# Patient Record
Sex: Female | Born: 1960
Health system: Southern US, Community
[De-identification: ages and names within clinical notes are randomized; demographics above are authoritative.]

## PROBLEM LIST (undated history)

## (undated) DIAGNOSIS — J40 Bronchitis, not specified as acute or chronic: Secondary | ICD-10-CM

## (undated) DIAGNOSIS — I471 Supraventricular tachycardia, unspecified: Secondary | ICD-10-CM

## (undated) DIAGNOSIS — Z9289 Personal history of other medical treatment: Secondary | ICD-10-CM

## (undated) DIAGNOSIS — I456 Pre-excitation syndrome: Secondary | ICD-10-CM

## (undated) DIAGNOSIS — M1990 Unspecified osteoarthritis, unspecified site: Secondary | ICD-10-CM

## (undated) DIAGNOSIS — Z9889 Other specified postprocedural states: Secondary | ICD-10-CM

## (undated) HISTORY — DX: Personal history of other medical treatment: Z92.89

---
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
## 1998-06-20 ENCOUNTER — Other Ambulatory Visit: Admission: RE | Admit: 1998-06-20 | Discharge: 1998-06-20 | Payer: Self-pay | Admitting: *Deleted

## 1999-07-16 ENCOUNTER — Other Ambulatory Visit: Admission: RE | Admit: 1999-07-16 | Discharge: 1999-07-16 | Payer: Self-pay | Admitting: *Deleted

## 1999-08-22 HISTORY — PX: BREAST BIOPSY: SHX20

## 2000-07-30 ENCOUNTER — Other Ambulatory Visit: Admission: RE | Admit: 2000-07-30 | Discharge: 2000-07-30 | Payer: Self-pay | Admitting: *Deleted

## 2001-07-29 ENCOUNTER — Other Ambulatory Visit: Admission: RE | Admit: 2001-07-29 | Discharge: 2001-07-29 | Payer: Self-pay | Admitting: *Deleted

## 2002-10-03 ENCOUNTER — Other Ambulatory Visit: Admission: RE | Admit: 2002-10-03 | Discharge: 2002-10-03 | Payer: Self-pay | Admitting: *Deleted

## 2003-10-25 ENCOUNTER — Other Ambulatory Visit: Admission: RE | Admit: 2003-10-25 | Discharge: 2003-10-25 | Payer: Self-pay | Admitting: *Deleted

## 2004-06-30 ENCOUNTER — Ambulatory Visit (HOSPITAL_COMMUNITY): Admission: RE | Admit: 2004-06-30 | Discharge: 2004-06-30 | Payer: Self-pay | Admitting: Family Medicine

## 2004-12-25 ENCOUNTER — Encounter: Admission: RE | Admit: 2004-12-25 | Discharge: 2004-12-25 | Payer: Self-pay | Admitting: Obstetrics and Gynecology

## 2004-12-29 ENCOUNTER — Encounter: Admission: RE | Admit: 2004-12-29 | Discharge: 2004-12-29 | Payer: Self-pay | Admitting: Obstetrics and Gynecology

## 2004-12-29 ENCOUNTER — Encounter (INDEPENDENT_AMBULATORY_CARE_PROVIDER_SITE_OTHER): Payer: Self-pay | Admitting: *Deleted

## 2005-01-02 ENCOUNTER — Other Ambulatory Visit: Admission: RE | Admit: 2005-01-02 | Discharge: 2005-01-02 | Payer: Self-pay | Admitting: Obstetrics and Gynecology

## 2006-01-12 ENCOUNTER — Other Ambulatory Visit: Admission: RE | Admit: 2006-01-12 | Discharge: 2006-01-12 | Payer: Self-pay | Admitting: Obstetrics and Gynecology

## 2009-09-30 ENCOUNTER — Encounter: Payer: Self-pay | Admitting: Internal Medicine

## 2009-09-30 ENCOUNTER — Ambulatory Visit: Payer: Self-pay | Admitting: Pulmonary Disease

## 2009-09-30 DIAGNOSIS — I456 Pre-excitation syndrome: Secondary | ICD-10-CM | POA: Insufficient documentation

## 2009-09-30 DIAGNOSIS — J309 Allergic rhinitis, unspecified: Secondary | ICD-10-CM | POA: Insufficient documentation

## 2009-09-30 LAB — CONVERTED CEMR LAB
Basophils Absolute: 0.1 10*3/uL (ref 0.0–0.1)
HCT: 36.3 % (ref 36.0–46.0)
IgE (Immunoglobulin E), Serum: 53.4 intl units/mL (ref 0.0–180.0)
Lymphs Abs: 3 10*3/uL (ref 0.7–4.0)
Monocytes Absolute: 0.6 10*3/uL (ref 0.1–1.0)
Monocytes Relative: 7.2 % (ref 3.0–12.0)
Platelets: 356 10*3/uL (ref 150.0–400.0)
RDW: 12 % (ref 11.5–14.6)

## 2009-10-03 ENCOUNTER — Ambulatory Visit: Payer: Self-pay | Admitting: Cardiology

## 2011-01-10 ENCOUNTER — Encounter: Payer: Self-pay | Admitting: Obstetrics and Gynecology

## 2012-04-08 ENCOUNTER — Other Ambulatory Visit: Payer: Self-pay | Admitting: Obstetrics and Gynecology

## 2012-04-08 DIAGNOSIS — R928 Other abnormal and inconclusive findings on diagnostic imaging of breast: Secondary | ICD-10-CM

## 2012-04-26 ENCOUNTER — Ambulatory Visit
Admission: RE | Admit: 2012-04-26 | Discharge: 2012-04-26 | Disposition: A | Discharge status: Home / Self Care | Payer: BC Managed Care – PPO | Source: Ambulatory Visit | Attending: Obstetrics and Gynecology | Admitting: Obstetrics and Gynecology

## 2012-04-26 DIAGNOSIS — R928 Other abnormal and inconclusive findings on diagnostic imaging of breast: Secondary | ICD-10-CM

## 2013-04-20 DIAGNOSIS — Z9889 Other specified postprocedural states: Secondary | ICD-10-CM

## 2013-04-20 HISTORY — DX: Other specified postprocedural states: Z98.890

## 2013-05-01 ENCOUNTER — Encounter (HOSPITAL_COMMUNITY): Payer: Self-pay | Admitting: Pharmacy Technician

## 2013-05-02 ENCOUNTER — Encounter (HOSPITAL_COMMUNITY)
Admission: RE | Admit: 2013-05-02 | Discharge: 2013-05-02 | Disposition: A | Discharge status: Home / Self Care | Payer: BC Managed Care – PPO | Source: Ambulatory Visit | Attending: Orthopedic Surgery | Admitting: Orthopedic Surgery

## 2013-05-02 ENCOUNTER — Telehealth: Payer: Self-pay | Admitting: Internal Medicine

## 2013-05-02 ENCOUNTER — Encounter (HOSPITAL_COMMUNITY): Payer: Self-pay

## 2013-05-02 HISTORY — DX: Pre-excitation syndrome: I45.6

## 2013-05-02 HISTORY — DX: Unspecified osteoarthritis, unspecified site: M19.90

## 2013-05-02 LAB — COMPREHENSIVE METABOLIC PANEL
BUN: 12 mg/dL (ref 6–23)
Calcium: 9.5 mg/dL (ref 8.4–10.5)
GFR calc non Af Amer: >90 mL/min (ref >90)
Glucose, Bld: 95 mg/dL (ref 70–99)
Sodium: 140 mEq/L (ref 135–145)
Total Bilirubin: 0.2 mg/dL — ABNORMAL LOW (ref 0.3–1.2)
Total Protein: 7.1 g/dL (ref 6.0–8.3)

## 2013-05-02 LAB — CBC
MCH: 30.4 pg (ref 26.0–34.0)
MCHC: 33.8 g/dL (ref 30.0–36.0)
MCV: 89.8 fL (ref 78.0–100.0)
Platelets: 474 10*3/uL — ABNORMAL HIGH (ref 150–400)
RBC: 3.82 MIL/uL — ABNORMAL LOW (ref 3.87–5.11)
RDW: 12.6 % (ref 11.5–15.5)

## 2013-05-02 LAB — HCG, SERUM, QUALITATIVE: Preg, Serum: NEGATIVE

## 2013-05-02 NOTE — Pre-Procedure Instructions (Signed)
Laura Wheeler  05/02/2013   Your procedure is scheduled on:  May 15  Report to [facility] at 09:45 AM.  Call this number if you have problems the morning of surgery: [phone]   Remember:   Do not eat food or drink liquids after midnight.   Take these medicines the morning of surgery with A SIP OF WATER: None   Do not wear jewelry, make-up or nail polish.  Do not wear lotions, powders, or perfumes. You may wear deodorant.  Do not shave 48 hours prior to surgery. Men may shave face and neck.  Do not bring valuables to the hospital.  Contacts, dentures or bridgework may not be worn into surgery.  Leave suitcase in the car. After surgery it may be brought to your room.  For patients admitted to the hospital, checkout time is 11:00 AM the day of discharge.   Patients discharged the day of surgery will not be allowed to drive home.  Name and phone number of your driver: Family  Special Instructions: Shower using CHG 2 nights before surgery and the night before surgery.  If you shower the day of surgery use CHG.  Use special wash - you have one bottle of CHG for all showers.  You should use approximately 1/3 of the bottle for each shower.   Please read over the following fact sheets that you were given: Pain Booklet, Coughing and Deep Breathing, MRSA Information and Surgical Site Infection Prevention

## 2013-05-02 NOTE — Telephone Encounter (Signed)
Molly Maduro called asking for an ekg and cardiac clearance note for this patient. I have faxed these records to him today 05/02/13.

## 2013-05-02 NOTE — Progress Notes (Signed)
Called office and requested that orders be placed in computer

## 2013-05-02 NOTE — Progress Notes (Signed)
Requested records from [facility] Dr. Rennis Golden EKG and Cardiac Clearance.

## 2013-05-03 ENCOUNTER — Other Ambulatory Visit: Payer: Self-pay | Admitting: Orthopedic Surgery

## 2013-05-04 ENCOUNTER — Ambulatory Visit (HOSPITAL_COMMUNITY): Payer: BC Managed Care – PPO | Admitting: Certified Registered Nurse Anesthetist

## 2013-05-04 ENCOUNTER — Encounter (HOSPITAL_COMMUNITY)
Admission: RE | Disposition: A | Discharge status: Home / Self Care | Payer: Self-pay | Source: Ambulatory Visit | Attending: Orthopedic Surgery

## 2013-05-04 ENCOUNTER — Ambulatory Visit (HOSPITAL_COMMUNITY)
Admission: RE | Admit: 2013-05-04 | Discharge: 2013-05-04 | Disposition: A | Discharge status: Home / Self Care | Payer: BC Managed Care – PPO | Source: Ambulatory Visit | Attending: Orthopedic Surgery | Admitting: Orthopedic Surgery

## 2013-05-04 ENCOUNTER — Encounter (HOSPITAL_COMMUNITY): Payer: Self-pay | Admitting: *Deleted

## 2013-05-04 ENCOUNTER — Encounter (HOSPITAL_COMMUNITY): Payer: Self-pay | Admitting: Certified Registered Nurse Anesthetist

## 2013-05-04 DIAGNOSIS — M129 Arthropathy, unspecified: Secondary | ICD-10-CM | POA: Insufficient documentation

## 2013-05-04 DIAGNOSIS — M7750 Other enthesopathy of unspecified foot: Secondary | ICD-10-CM | POA: Insufficient documentation

## 2013-05-04 DIAGNOSIS — M7742 Metatarsalgia, left foot: Secondary | ICD-10-CM

## 2013-05-04 DIAGNOSIS — G5760 Lesion of plantar nerve, unspecified lower limb: Secondary | ICD-10-CM | POA: Insufficient documentation

## 2013-05-04 HISTORY — PX: EXCISION MORTON'S NEUROMA: SHX5013

## 2013-05-04 SURGERY — EXCISION, MORTON'S NEUROMA
Anesthesia: General | Site: Foot | Laterality: Left | Wound class: Clean

## 2013-05-04 MED ORDER — HYDROMORPHONE HCL PF 1 MG/ML IJ SOLN
INTRAMUSCULAR | Status: AC
  Filled 2013-05-04: qty 1

## 2013-05-04 MED ORDER — LACTATED RINGERS IV SOLN
INTRAVENOUS | Status: DC
  Administered 2013-05-04: 11:00:00 via INTRAVENOUS

## 2013-05-04 MED ORDER — BACITRACIN ZINC 500 UNIT/GM EX OINT
TOPICAL_OINTMENT | CUTANEOUS | Status: DC | PRN
  Administered 2013-05-04: 1 via TOPICAL

## 2013-05-04 MED ORDER — FENTANYL CITRATE 0.05 MG/ML IJ SOLN
INTRAMUSCULAR | Status: DC | PRN
  Administered 2013-05-04 (×2): 50 ug via INTRAVENOUS

## 2013-05-04 MED ORDER — CHLORHEXIDINE GLUCONATE 4 % EX LIQD: 60.0000 mL | Freq: Once | CUTANEOUS | Status: DC

## 2013-05-04 MED ORDER — FENTANYL CITRATE 0.05 MG/ML IJ SOLN: 50.0000 ug | INTRAMUSCULAR | Status: DC | PRN

## 2013-05-04 MED ORDER — OXYCODONE HCL 5 MG PO TABS: 5.0000 mg | ORAL_TABLET | ORAL | Status: DC | PRN

## 2013-05-04 MED ORDER — OXYCODONE HCL 5 MG/5ML PO SOLN: 5.0000 mg | Freq: Once | ORAL | Status: DC | PRN

## 2013-05-04 MED ORDER — PROMETHAZINE HCL 25 MG/ML IJ SOLN: 6.2500 mg | INTRAMUSCULAR | Status: DC | PRN

## 2013-05-04 MED ORDER — CEFAZOLIN SODIUM-DEXTROSE 2-3 GM-% IV SOLR
2.0000 g | INTRAVENOUS | Status: AC
  Administered 2013-05-04: 2 g via INTRAVENOUS

## 2013-05-04 MED ORDER — SODIUM CHLORIDE 0.9 % IV SOLN: INTRAVENOUS | Status: DC

## 2013-05-04 MED ORDER — BACITRACIN ZINC 500 UNIT/GM EX OINT
TOPICAL_OINTMENT | CUTANEOUS | Status: AC
  Filled 2013-05-04: qty 15

## 2013-05-04 MED ORDER — SODIUM CHLORIDE 0.9 % IR SOLN
Status: DC | PRN
  Administered 2013-05-04: 1000 mL

## 2013-05-04 MED ORDER — MIDAZOLAM HCL 2 MG/2ML IJ SOLN: 1.0000 mg | INTRAMUSCULAR | Status: DC | PRN

## 2013-05-04 MED ORDER — PROPOFOL 10 MG/ML IV BOLUS
INTRAVENOUS | Status: DC | PRN
  Administered 2013-05-04: 180 mg via INTRAVENOUS

## 2013-05-04 MED ORDER — MIDAZOLAM HCL 2 MG/2ML IJ SOLN
INTRAMUSCULAR | Status: AC
  Administered 2013-05-04: 2 mg via INTRAVENOUS
  Filled 2013-05-04: qty 2

## 2013-05-04 MED ORDER — HYDROMORPHONE HCL PF 1 MG/ML IJ SOLN
0.2500 mg | INTRAMUSCULAR | Status: DC | PRN
  Administered 2013-05-04: 0.5 mg via INTRAVENOUS

## 2013-05-04 MED ORDER — LACTATED RINGERS IV SOLN
INTRAVENOUS | Status: DC | PRN
  Administered 2013-05-04: 12:00:00 via INTRAVENOUS

## 2013-05-04 MED ORDER — ONDANSETRON HCL 4 MG/2ML IJ SOLN
INTRAMUSCULAR | Status: DC | PRN
  Administered 2013-05-04: 4 mg via INTRAVENOUS

## 2013-05-04 MED ORDER — OXYCODONE HCL 5 MG PO TABS: 5.0000 mg | ORAL_TABLET | Freq: Once | ORAL | Status: DC | PRN

## 2013-05-04 MED ORDER — FENTANYL CITRATE 0.05 MG/ML IJ SOLN
INTRAMUSCULAR | Status: AC
  Administered 2013-05-04: 100 ug via INTRAVENOUS
  Filled 2013-05-04: qty 2

## 2013-05-04 MED ORDER — ROPIVACAINE HCL 5 MG/ML IJ SOLN
INTRAMUSCULAR | Status: DC | PRN
  Administered 2013-05-04: 150 mg via EPIDURAL

## 2013-05-04 MED ORDER — DEXAMETHASONE SODIUM PHOSPHATE 4 MG/ML IJ SOLN
INTRAMUSCULAR | Status: DC | PRN
  Administered 2013-05-04: 4 mg via INTRAVENOUS
  Administered 2013-05-04: 10 mg

## 2013-05-04 MED ORDER — CEFAZOLIN SODIUM-DEXTROSE 2-3 GM-% IV SOLR
INTRAVENOUS | Status: AC
  Filled 2013-05-04: qty 50

## 2013-05-04 SURGICAL SUPPLY — 50 items
BANDAGE CONFORM 3  STR LF (GAUZE/BANDAGES/DRESSINGS) ×2 IMPLANT
BANDAGE ELASTIC 4 VELCRO ST LF (GAUZE/BANDAGES/DRESSINGS) ×2 IMPLANT
BANDAGE ESMARK 6X9 LF (GAUZE/BANDAGES/DRESSINGS) ×1 IMPLANT
BLADE SURG 10 STRL SS (BLADE) ×2 IMPLANT
BNDG CMPR 9X6 STRL LF SNTH (GAUZE/BANDAGES/DRESSINGS) ×1
BNDG COHESIVE 4X5 TAN STRL (GAUZE/BANDAGES/DRESSINGS) ×2 IMPLANT
BNDG COHESIVE 6X5 TAN STRL LF (GAUZE/BANDAGES/DRESSINGS) ×2 IMPLANT
BNDG ESMARK 6X9 LF (GAUZE/BANDAGES/DRESSINGS) ×2
CHLORAPREP W/TINT 26ML (MISCELLANEOUS) ×2 IMPLANT
CLOTH BEACON ORANGE TIMEOUT ST (SAFETY) ×2 IMPLANT
COVER SURGICAL LIGHT HANDLE (MISCELLANEOUS) ×2 IMPLANT
CUFF TOURNIQUET SINGLE 34IN LL (TOURNIQUET CUFF) ×2 IMPLANT
CUFF TOURNIQUET SINGLE 44IN (TOURNIQUET CUFF) IMPLANT
DRAIN PENROSE 1/2X12 LTX STRL (WOUND CARE) IMPLANT
DRAPE U-SHAPE 47X51 STRL (DRAPES) ×2 IMPLANT
DRSG ADAPTIC 3X8 NADH LF (GAUZE/BANDAGES/DRESSINGS) ×2 IMPLANT
DRSG PAD ABDOMINAL 8X10 ST (GAUZE/BANDAGES/DRESSINGS) IMPLANT
ELECT REM PT RETURN 9FT ADLT (ELECTROSURGICAL) ×2
ELECTRODE REM PT RTRN 9FT ADLT (ELECTROSURGICAL) ×1 IMPLANT
GLOVE BIO SURGEON STRL SZ8 (GLOVE) ×2 IMPLANT
GLOVE BIOGEL PI IND STRL 8 (GLOVE) ×1 IMPLANT
GLOVE BIOGEL PI INDICATOR 8 (GLOVE) ×1
GOWN PREVENTION PLUS XLARGE (GOWN DISPOSABLE) ×2 IMPLANT
GOWN STRL NON-REIN LRG LVL3 (GOWN DISPOSABLE) ×4 IMPLANT
KIT BASIN OR (CUSTOM PROCEDURE TRAY) ×2 IMPLANT
KIT ROOM TURNOVER OR (KITS) ×2 IMPLANT
MANIFOLD NEPTUNE II (INSTRUMENTS) ×2 IMPLANT
MEDIUM NARROW BLADE ×1 IMPLANT
NS IRRIG 1000ML POUR BTL (IV SOLUTION) ×2 IMPLANT
PACK ORTHO EXTREMITY (CUSTOM PROCEDURE TRAY) ×2 IMPLANT
PAD ARMBOARD 7.5X6 YLW CONV (MISCELLANEOUS) ×4 IMPLANT
PAD CAST 4YDX4 CTTN HI CHSV (CAST SUPPLIES) ×1 IMPLANT
PADDING CAST COTTON 4X4 STRL (CAST SUPPLIES) ×2
SOAP 2 % CHG 4 OZ (WOUND CARE) ×2 IMPLANT
SPONGE GAUZE 4X4 12PLY (GAUZE/BANDAGES/DRESSINGS) ×1 IMPLANT
STAPLER VISISTAT 35W (STAPLE) IMPLANT
SUCTION FRAZIER TIP 10 FR DISP (SUCTIONS) ×2 IMPLANT
SUT ETHILON 3 0 FSL (SUTURE) ×1 IMPLANT
SUT MNCRL AB 3-0 PS2 18 (SUTURE) ×1 IMPLANT
SUT PROLENE 3 0 PS 2 (SUTURE) ×2 IMPLANT
SUT VIC AB 2-0 CT1 27 (SUTURE) ×4
SUT VIC AB 2-0 CT1 TAPERPNT 27 (SUTURE) ×2 IMPLANT
SUT VIC AB 3-0 PS2 18 (SUTURE) ×2
SUT VIC AB 3-0 PS2 18XBRD (SUTURE) ×1 IMPLANT
TOWEL OR 17X24 6PK STRL BLUE (TOWEL DISPOSABLE) ×2 IMPLANT
TOWEL OR 17X26 10 PK STRL BLUE (TOWEL DISPOSABLE) ×2 IMPLANT
TUBE CONNECTING 12X1/4 (SUCTIONS) ×2 IMPLANT
TUBING CYSTO DISP (UROLOGICAL SUPPLIES) ×2 IMPLANT
WATER STERILE IRR 1000ML POUR (IV SOLUTION) ×2 IMPLANT
YANKAUER SUCT BULB TIP NO VENT (SUCTIONS) ×2 IMPLANT

## 2013-05-04 NOTE — Brief Op Note (Addendum)
05/04/2013  12:52 PM  PATIENT:  Laura Wheeler  52 y.o. female  PRE-OPERATIVE DIAGNOSIS:  LEFT SECOND WEBB SPACE MORTON NEUROMA and left SECOND METATARSALGIA  POST-OPERATIVE DIAGNOSIS:  same  Procedure(s): 1.  Excision of left 2nd webspace Morton's neuroma 2.  Left 2nd metatarsal Weil osteotomy  SURGEON:  [name], MD  ASSISTANT: n/a  ANESTHESIA:   General, regional  EBL:  minimal   TOURNIQUET:   Total Tourniquet Time Documented: Thigh (Left) - 24 minutes Total: Thigh (Left) - 24 minutes   COMPLICATIONS:  None apparent  DISPOSITION:  Extubated, awake and stable to recovery.  DICTATION ID:  [id number]

## 2013-05-04 NOTE — Transfer of Care (Signed)
Immediate Anesthesia Transfer of Care Note  Patient: Laura Wheeler  Procedure(s) Performed: Procedure(s): LEFT SECOND MT  WEIL/SECOND WEBB SPACE NEUROMA EXCISION  (Left)  Patient Location: PACU  Anesthesia Type:GA combined with regional for post-op pain  Level of Consciousness: awake, alert  and oriented  Airway & Oxygen Therapy: Patient Spontanous Breathing and Patient connected to nasal cannula oxygen  Post-op Assessment: Report given to PACU RN, Post -op Vital signs reviewed and stable and Patient moving all extremities  Post vital signs: Reviewed and stable  Complications: No apparent anesthesia complications

## 2013-05-04 NOTE — Anesthesia Preprocedure Evaluation (Addendum)
Anesthesia Evaluation  Patient identified by MRN, date of birth, ID band Patient awake    Reviewed: Allergy & Precautions, H&P , NPO status , Patient's Chart, lab work & pertinent test results  History of Anesthesia Complications Negative for: history of anesthetic complications  Airway Mallampati: II TM Distance: >3 FB Neck ROM: Full    Dental  (+) Teeth Intact and Dental Advisory Given   Pulmonary neg pulmonary ROS,    Pulmonary exam normal       Cardiovascular + dysrhythmias     Neuro/Psych negative neurological ROS     GI/Hepatic negative GI ROS, Neg liver ROS,   Endo/Other  negative endocrine ROS  Renal/GU negative Renal ROS     Musculoskeletal   Abdominal   Peds  Hematology   Anesthesia Other Findings   Reproductive/Obstetrics                          Anesthesia Physical Anesthesia Plan  ASA: II  Anesthesia Plan: General   Post-op Pain Management:    Induction: Intravenous  Airway Management Planned: LMA  Additional Equipment:   Intra-op Plan:   Post-operative Plan: Extubation in OR  Informed Consent: I have reviewed the patients History and Physical, chart, labs and discussed the procedure including the risks, benefits and alternatives for the proposed anesthesia with the patient or authorized representative who has indicated his/her understanding and acceptance.   Dental advisory given  Plan Discussed with: CRNA, Anesthesiologist and Surgeon  Anesthesia Plan Comments:        Anesthesia Quick Evaluation

## 2013-05-04 NOTE — H&P (Signed)
Laura Wheeler is an 52 y.o. female.   Chief Complaint: left forefoot pain HPI: 52 y/o female with PMH of WPW syndrome c/o painful right forefoot neuroma and 2nd metatarsalgia.  She presents now for surgical treatment.  Past Medical History  Diagnosis Date  . WPW (Wolff-Parkinson-White syndrome)     Dr. Rennis Golden Cardiology  . Arthritis     History reviewed. No pertinent past surgical history.  History reviewed. No pertinent family history. Social History:  reports that she has never smoked. She does not have any smokeless tobacco history on file. She reports that she drinks about 1.8 ounces of alcohol per week. She reports that she does not use illicit drugs.  Allergies:  Allergies  Allergen Reactions  . Sulfa Antibiotics Other (See Comments)    Doesn't remember reaction - 20 years ago    Medications Prior to Admission  Medication Sig Dispense Refill  . etonogestrel-ethinyl estradiol (NUVARING) 0.12-0.015 MG/24HR vaginal ring Place 1 each vaginally every 28 (twenty-eight) days. Insert vaginally and leave in place for 3 consecutive weeks, then remove for 1 week.        Results for orders placed during the hospital encounter of 05/02/13 (from the past 48 hour(s))  SURGICAL PCR SCREEN     Status: Abnormal   Collection Time    05/02/13  3:33 PM      Result Value Range   MRSA, PCR NEGATIVE  NEGATIVE   Staphylococcus aureus POSITIVE (*) NEGATIVE   Comment:            The Xpert SA Assay (FDA     approved for NASAL specimens     in patients over 31 years of age),     is one component of     a comprehensive surveillance     program.  Test performance has     been validated by [employer] for patients greater     than or equal to 37 year old.     It is not intended     to diagnose infection nor to     guide or monitor treatment.  COMPREHENSIVE METABOLIC PANEL     Status: Abnormal   Collection Time    05/02/13  3:48 PM      Result Value Range   Sodium 140  135 - 145 mEq/L    Potassium 4.0  3.5 - 5.1 mEq/L   Chloride 105  96 - 112 mEq/L   CO2 25  19 - 32 mEq/L   Glucose, Bld 95  70 - 99 mg/dL   BUN 12  6 - 23 mg/dL   Creatinine, Ser 4.09  0.50 - 1.10 mg/dL   Calcium 9.5  8.4 - 81.1 mg/dL   Total Protein 7.1  6.0 - 8.3 g/dL   Albumin 2.7 (*) 3.5 - 5.2 g/dL   AST 14  0 - 37 U/L   ALT 12  0 - 35 U/L   Alkaline Phosphatase 65  39 - 117 U/L   Total Bilirubin 0.2 (*) 0.3 - 1.2 mg/dL   GFR calc non Af Amer >90  >90 mL/min   GFR calc Af Amer >90  >90 mL/min   Comment:            The eGFR has been calculated     using the CKD EPI equation.     This calculation has not been     validated in all clinical     situations.  eGFR's persistently     <90 mL/min signify     possible Chronic Kidney Disease.  CBC     Status: Abnormal   Collection Time    05/02/13  3:48 PM      Result Value Range   WBC 9.5  4.0 - 10.5 K/uL   RBC 3.82 (*) 3.87 - 5.11 MIL/uL   Hemoglobin 11.6 (*) 12.0 - 15.0 g/dL   HCT 14.7 (*) 82.9 - 56.2 %   MCV 89.8  78.0 - 100.0 fL   MCH 30.4  26.0 - 34.0 pg   MCHC 33.8  30.0 - 36.0 g/dL   RDW 13.0  86.5 - 78.4 %   Platelets 474 (*) 150 - 400 K/uL  HCG, SERUM, QUALITATIVE     Status: None   Collection Time    05/02/13  3:48 PM      Result Value Range   Preg, Serum NEGATIVE  NEGATIVE   Comment:            THE SENSITIVITY OF THIS     METHODOLOGY IS >10 mIU/mL.   No results found.  ROS  No recent f/c/n/v/wt loss  Blood pressure 113/77, pulse 88, temperature 97.4 F (36.3 C), temperature source Oral, resp. rate 20, SpO2 99.00%. Physical Exam  wn wd woman in nad.  A and O x 4.  Mood and affect normal.  EOMI.  Resp unlabored.  Left foot with healthy skin and no lymhpadenopathy.  2+ dp and pt pulses.  5/5 strength in PF and DF ot he ankle and toes.  Assessment/Plan Right 2nd webspace Morton's neuroma and 2nd metatarsalgia - to OR for 2nd MT weil osteotomy and 2nd webspace Morton's neuroma excision.  The risks and benefits of the  alternative treatment options have been discussed in detail.  The patient wishes to proceed with surgery and specifically understands risks of bleeding, infection, nerve damage, blood clots, need for additional surgery, amputation and death.   Laura Wheeler 05/13/2013, 11:57 AM

## 2013-05-04 NOTE — Anesthesia Procedure Notes (Addendum)
Anesthesia Regional Block:  Popliteal block  Pre-Anesthetic Checklist: ,, timeout performed, Correct Patient, Correct Site, Correct Laterality, Correct Procedure, Correct Position, site marked, Risks and benefits discussed,  Surgical consent,  Pre-op evaluation,  At surgeon's request and post-op pain management  Laterality: Left  Prep: chloraprep       Needles:  Injection technique: Single-shot  Needle Type: Echogenic Stimulator Needle          Additional Needles:  Procedures: ultrasound guided (picture in chart) and nerve stimulator Popliteal block  Nerve Stimulator or Paresthesia:  Response: plantar flexion, 0.45 mA,   Additional Responses:   Narrative:  Start time: 05/04/2013 11:18 AM End time: 05/04/2013 11:28 AM  Performed by: Personally  Anesthesiologist: J. Adonis Huguenin, MD  Additional Notes: A functioning IV was confirmed and monitors were applied.  Sterile prep and drape, hand hygiene and sterile gloves were used.  Negative aspiration and test dose prior to incremental administration of local anesthetic. The patient tolerated the procedure well.Ultrasound  guidance: relevant anatomy identified, needle position confirmed, local anesthetic spread visualized around nerve(s), vascular puncture avoided.  Image printed for medical record.   Popliteal block Procedure Name: LMA Insertion Date/Time: 05/04/2013 12:08 PM Performed by: Orvilla Fus A Pre-anesthesia Checklist: Patient identified, Timeout performed, Emergency Drugs available, Suction available and Patient being monitored Patient Re-evaluated:Patient Re-evaluated prior to inductionOxygen Delivery Method: Circle system utilized Preoxygenation: Pre-oxygenation with 100% oxygen Intubation Type: IV induction Ventilation: Mask ventilation without difficulty LMA: LMA inserted LMA Size: 4.0 Number of attempts: 1 Placement Confirmation: breath sounds checked- equal and bilateral and positive ETCO2 Tube secured with:  Tape Dental Injury: Teeth and Oropharynx as per pre-operative assessment

## 2013-05-04 NOTE — Anesthesia Postprocedure Evaluation (Signed)
Anesthesia Post Note  Patient: Laura Wheeler  Procedure(s) Performed: Procedure(s) (LRB): LEFT SECOND MT  WEIL/SECOND WEBB SPACE NEUROMA EXCISION  (Left)  Anesthesia type: general  Patient location: PACU  Post pain: Pain level controlled  Post assessment: Patient's Cardiovascular Status Stable  Last Vitals:  Filed Vitals:   05/04/13 1423  BP:   Pulse: 90  Temp:   Resp: 20    Post vital signs: Reviewed and stable  Level of consciousness: sedated  Complications: No apparent anesthesia complications

## 2013-05-04 NOTE — Progress Notes (Signed)
Orthopedic Tech Progress Note Patient Details:  Laura Wheeler 1961-01-17 [id number] Darco shoe applied to Left LE. Tolerated well.  Ortho Devices Type of Ortho Device: Darco shoe Ortho Device/Splint Location: Left Ortho Device/Splint Interventions: Application   Asia R Thompson 05/04/2013, 1:53 PM

## 2013-05-05 ENCOUNTER — Encounter (HOSPITAL_COMMUNITY): Payer: Self-pay | Admitting: Orthopedic Surgery

## 2013-05-05 NOTE — Op Note (Signed)
Laura Wheeler, Laura Wheeler NO.:  [id number]  MEDICAL RECORD NO.:  [id number]  LOCATION:  [facility]                         FACILITY:  [facility]  PHYSICIAN:  [name], MD        DATE OF BIRTH:  1961/01/27  DATE OF PROCEDURE:  05/04/2013 DATE OF DISCHARGE:  05/04/2013                              OPERATIVE REPORT   PREOPERATIVE DIAGNOSES: 1. Left second web space Morton neuroma. 2. Left second metatarsal head overload (metatarsalgia).  POSTOPERATIVE DIAGNOSES: 1. Left second web space Morton neuroma. 2. Left second metatarsal head overload (metatarsalgia).  PROCEDURE: 1. Excision of left second web space Morton neuroma. 2. Left second metatarsal Weil osteotomy.  SURGEON:  [name], MD.  ANESTHESIA:  General, regional.  ESTIMATED BLOOD LOSS:  Minimal.  TOURNIQUET TIME:  24 minutes at 250 mmHg.  COMPLICATIONS:  None apparent.  DISPOSITION:  Extubated awake and stable to recovery.  INDICATIONS FOR PROCEDURE:  The patient is a 52 year old woman with a painful left forefoot.  She has signs and symptoms consistent with a Morton neuroma and has had good relief from a diagnostic injection.  She also has evidence of second metatarsalgia.  She presents now for operative treatment of these conditions having failed nonoperative treatment.  She understands the risks and benefits, the alternative treatment options and elects surgical treatment.  She specifically understands risks of bleeding, infection, nerve damage, blood clots, need for additional surgery, amputation, and death.  PROCEDURE IN DETAIL:  After preoperative consent was obtained, the correct operative site was identified.  The patient was brought to the operating room and placed supine on the operating table.  General anesthesia was induced.  Preoperative antibiotics were administered. Surgical time-out was taken.  The left lower extremity was prepped and draped in standard sterile fashion.  The extremity  was exsanguinated and tourniquet was inflated to 250 mmHg.  A longitudinal incision was made at the second web space.  Sharp dissection was carried down through the skin and subcutaneous tissue.  The metatarsals were distracted with a retractor.  Intermetatarsal ligament was identified and divided under direct vision, taking care to protect the underlying neurovascular structures.  The interdigital nerve was identified.  It was traced proximally to the level of the intrinsic foot muscles and distally to the bifurcation in each toe.  The nerve was noted to be compressed centrally with an area of stenosis proximal to the bifurcation. Distally, the nerve appeared quite thickened and swollen.  The decision was made to excise this area of fibrotic nerve.  The nerve was transected proximally and allowed to retract into the musculature. Distally, the nerve was transected distal to the bifurcation.  The nerve was removed and passed off the field.  The wound was irrigated copiously.  Attention was then turned to the second metatarsal.  A dorsal capsulotomy was made lateral to the extensor digitorum brevis tendon. The metatarsal head was exposed.  A Weil osteotomy was then made with an oscillating saw, removing a small wafer of bone distally.  The head was allowed to retract proximally approximately 3 mm.  The osteotomy was then fixed with a 2 mm partially threaded cancellous screw.  The overhanging bone was  trimmed with a rongeur.  The wound was irrigated copiously.  The incision was closed with inverted simple sutures of 3-0 Monocryl and horizontal mattress sutures of 3-0 nylon.  Sterile dressings were applied followed by a compression wrap.  The tourniquet was released at 27 minutes.  The patient was then awakened from anesthesia and transported to recovery room in stable condition.  FOLLOWUP PLAN:  The patient will be weightbearing as tolerated on her foot in a Darco wedge style shoe.  She  will follow up with me in 2 weeks for suture removal.  Radiographs AP and lateral views of the left foot were obtained intraoperatively.  These show interval shortening of the second metatarsal with a single screw across the osteotomy site.  Otherwise, alignment is normal with no significant degenerative changes noted.     [name], MD     JH/MEDQ  D:  05/04/2013  T:  05/05/2013  Job:  [id number]

## 2013-05-10 ENCOUNTER — Encounter: Payer: Self-pay | Admitting: *Deleted

## 2013-05-10 ENCOUNTER — Encounter (HOSPITAL_COMMUNITY): Payer: Self-pay | Admitting: Pharmacy Technician

## 2013-05-10 ENCOUNTER — Ambulatory Visit (INDEPENDENT_AMBULATORY_CARE_PROVIDER_SITE_OTHER): Payer: BC Managed Care – PPO | Admitting: Internal Medicine

## 2013-05-10 ENCOUNTER — Encounter: Payer: Self-pay | Admitting: Internal Medicine

## 2013-05-10 VITALS — BP 112/59 | HR 83 | Ht 66.0 in | Wt 157.0 lb

## 2013-05-10 DIAGNOSIS — I471 Supraventricular tachycardia: Secondary | ICD-10-CM

## 2013-05-10 DIAGNOSIS — I456 Pre-excitation syndrome: Secondary | ICD-10-CM

## 2013-05-10 NOTE — Patient Instructions (Addendum)
You will need lab work today for your upcoming procedure  Your physician recommends that you continue on your current medications as directed. Please refer to the Current Medication list given to you today.  Your physician has recommended that you have an SVT ablation. Catheter ablation is a medical procedure used to treat some cardiac arrhythmias (irregular heartbeats). During catheter ablation, a long, thin, flexible tube is put into a blood vessel in your groin (upper thigh), or neck. This tube is called an ablation catheter. It is then guided to your heart through the blood vessel. Radio frequency waves destroy small areas of heart tissue where abnormal heartbeats may cause an arrhythmia to start. Please see the instruction sheet given to you today.

## 2013-05-10 NOTE — Assessment & Plan Note (Signed)
I discussed the treatment options with the patient. Additional medical therapy versus catheter ablation have been discussed including the risk, benefits, goals, and expectations of catheter ablation. She wishes to proceed with ablation. This be scheduled in the next week or 2.

## 2013-05-10 NOTE — Progress Notes (Signed)
HPI Laura Wheeler is referred today by Dr. Rennis Golden for evaluation of WPW syndrome. Her history dates back to her young adult years when she would develop palpitations and sustained heart racing. She subsequently learned that vagal maneuvers would result in the palpitations stopping. During pregnancy, she had worsening SVT although her 2 pregnancies were otherwise uncomplicated. For the last 17 years, she has had one or 2 episodes a week. These episodes start and stop suddenly and the patient has learned that she gets on her side and bears down, the episodes will terminate. She has never had a documented ECG of her SVT. She has not passed out. Most recently, her episodes have lasted for over an hour. In the past, she tried calcium channel blockers which did not improve her symptoms and resulted in constipation.  Allergies  Allergen Reactions  . Sulfa Antibiotics Other (See Comments)    Doesn't remember reaction - 20 years ago     Current Outpatient Prescriptions  Medication Sig Dispense Refill  . etonogestrel-ethinyl estradiol (NUVARING) 0.12-0.015 MG/24HR vaginal ring Place 1 each vaginally every 28 (twenty-eight) days. Insert vaginally and leave in place for 3 consecutive weeks, then remove for 1 week.      Marland Kitchen oxyCODONE (OXY IR/ROXICODONE) 5 MG immediate release tablet Take 1-2 tablets (5-10 mg total) by mouth every 4 (four) hours as needed.  30 tablet  0   No current facility-administered medications for this visit.     Past Medical History  Diagnosis Date  . WPW (Wolff-Parkinson-White syndrome)     Dr. Rennis Golden Cardiology  . Arthritis     ROS:   All systems reviewed and negative except as noted in the HPI.   Past Surgical History  Procedure Laterality Date  . Excision morton's neuroma Left 05/04/2013    Procedure: LEFT SECOND MT  WEIL/SECOND WEBB SPACE NEUROMA EXCISION ;  Surgeon: [name], MD;  Location: [facility] OR;  Service: Orthopedics;  Laterality: Left;     No family history on  file.   History   Social History  . Marital Status: Married    Spouse Name: N/A    Number of Children: N/A  . Years of Education: N/A   Occupational History  . Not on file.   Social History Main Topics  . Smoking status: Never Smoker   . Smokeless tobacco: Not on file  . Alcohol Use: 1.8 oz/week    3 Glasses of wine per week  . Drug Use: No  . Sexually Active: Yes    Birth Control/ Protection: Inserts   Other Topics Concern  . Not on file   Social History Narrative  . No narrative on file     BP 112/59  Pulse 83  Ht 5\' 6"  (1.676 m)  Wt 157 lb (71.215 kg)  BMI 25.35 kg/m2  LMP 05/01/2013  Physical Exam:  Well appearing middle-age woman, NAD HEENT: Unremarkable Neck:  6 cm JVD, no thyromegally Back:  No CVA tenderness Lungs:  Clear with no wheezes, rales, or rhonchi. HEART:  Regular rate rhythm, no murmurs, no rubs, no clicks Abd:  soft, positive bowel sounds, no organomegally, no rebound, no guarding Ext:  2 plus pulses, no edema, no cyanosis, no clubbing, a bruit is present on her foot. Skin:  No rashes no nodules Neuro:  CN II through XII intact, motor grossly intact  EKG - normal sinus rhythm with WPW syndrome   Assess/Plan:

## 2013-05-11 LAB — CBC WITH DIFFERENTIAL/PLATELET
Eosinophils Relative: 1.8 % (ref 0.0–5.0)
HCT: 38.9 % (ref 36.0–46.0)
Lymphs Abs: 4.3 10*3/uL — ABNORMAL HIGH (ref 0.7–4.0)
MCV: 91.3 fl (ref 78.0–100.0)
Monocytes Absolute: 0.8 10*3/uL (ref 0.1–1.0)
Platelets: 529 10*3/uL — ABNORMAL HIGH (ref 150.0–400.0)
WBC: 10.5 10*3/uL (ref 4.5–10.5)

## 2013-05-11 LAB — PROTIME-INR: Prothrombin Time: 10.8 s (ref 10.2–12.4)

## 2013-05-12 ENCOUNTER — Other Ambulatory Visit: Payer: Self-pay | Admitting: *Deleted

## 2013-05-12 ENCOUNTER — Ambulatory Visit (INDEPENDENT_AMBULATORY_CARE_PROVIDER_SITE_OTHER): Payer: BC Managed Care – PPO | Admitting: *Deleted

## 2013-05-12 DIAGNOSIS — I456 Pre-excitation syndrome: Secondary | ICD-10-CM

## 2013-05-12 DIAGNOSIS — J309 Allergic rhinitis, unspecified: Secondary | ICD-10-CM

## 2013-05-12 LAB — BASIC METABOLIC PANEL
BUN: 12 mg/dL (ref 6–23)
Chloride: 100 mEq/L (ref 96–112)
Creatinine, Ser: 0.7 mg/dL (ref 0.4–1.2)
GFR: 101.8 mL/min (ref >60.00)

## 2013-05-18 ENCOUNTER — Telehealth: Payer: Self-pay | Admitting: Internal Medicine

## 2013-05-18 NOTE — Telephone Encounter (Signed)
Spoke with patient and she had her instructions.  She knows to go the main Entrance of the [employer] and not [employer]

## 2013-05-18 NOTE — Telephone Encounter (Signed)
New Problem  Pt has an appt tomorrow at the hospital with Dr Ladona Ridgel. She states no one from pre service has called her and she did not receive any letters form [place] regarding the procedure. She wants to know what she needs to do in the morning.

## 2013-05-19 ENCOUNTER — Encounter (HOSPITAL_COMMUNITY)
Admission: RE | Disposition: A | Discharge status: Home / Self Care | Payer: Self-pay | Source: Ambulatory Visit | Attending: Internal Medicine

## 2013-05-19 ENCOUNTER — Ambulatory Visit (HOSPITAL_COMMUNITY)
Admission: RE | Admit: 2013-05-19 | Discharge: 2013-05-20 | Disposition: A | Discharge status: Home / Self Care | Payer: BC Managed Care – PPO | Source: Ambulatory Visit | Attending: Internal Medicine | Admitting: Internal Medicine

## 2013-05-19 ENCOUNTER — Encounter (HOSPITAL_COMMUNITY): Payer: Self-pay | Admitting: *Deleted

## 2013-05-19 DIAGNOSIS — I498 Other specified cardiac arrhythmias: Secondary | ICD-10-CM | POA: Insufficient documentation

## 2013-05-19 DIAGNOSIS — I4729 Other ventricular tachycardia: Secondary | ICD-10-CM

## 2013-05-19 DIAGNOSIS — I472 Ventricular tachycardia, unspecified: Secondary | ICD-10-CM

## 2013-05-19 DIAGNOSIS — M129 Arthropathy, unspecified: Secondary | ICD-10-CM | POA: Insufficient documentation

## 2013-05-19 DIAGNOSIS — I456 Pre-excitation syndrome: Secondary | ICD-10-CM | POA: Insufficient documentation

## 2013-05-19 HISTORY — DX: Supraventricular tachycardia, unspecified: I47.10

## 2013-05-19 HISTORY — DX: Supraventricular tachycardia: I47.1

## 2013-05-19 HISTORY — PX: SUPRAVENTRICULAR TACHYCARDIA ABLATION: SHX5492

## 2013-05-19 HISTORY — PX: SUPRAVENTRICULAR TACHYCARDIA ABLATION: SHX6106

## 2013-05-19 LAB — PREGNANCY, URINE: Preg Test, Ur: NEGATIVE

## 2013-05-19 SURGERY — SUPRAVENTRICULAR TACHYCARDIA ABLATION
Anesthesia: LOCAL

## 2013-05-19 MED ORDER — MIDAZOLAM HCL 5 MG/5ML IJ SOLN
INTRAMUSCULAR | Status: AC
  Filled 2013-05-19: qty 5

## 2013-05-19 MED ORDER — SODIUM CHLORIDE 0.9 % IV SOLN: 250.0000 mL | INTRAVENOUS | Status: DC | PRN

## 2013-05-19 MED ORDER — ONDANSETRON HCL 4 MG/2ML IJ SOLN: 4.0000 mg | Freq: Four times a day (QID) | INTRAMUSCULAR | Status: DC | PRN

## 2013-05-19 MED ORDER — OFF THE BEAT BOOK
Freq: Once | Status: AC
  Administered 2013-05-19: 21:00:00
  Filled 2013-05-19: qty 1

## 2013-05-19 MED ORDER — ACETAMINOPHEN 325 MG PO TABS
650.0000 mg | ORAL_TABLET | ORAL | Status: DC | PRN
  Administered 2013-05-19 – 2013-05-20 (×2): 650 mg via ORAL
  Filled 2013-05-19 (×2): qty 2

## 2013-05-19 MED ORDER — BUPIVACAINE HCL (PF) 0.25 % IJ SOLN
INTRAMUSCULAR | Status: AC
  Filled 2013-05-19: qty 60

## 2013-05-19 MED ORDER — SODIUM CHLORIDE 0.9 % IJ SOLN: 3.0000 mL | Freq: Two times a day (BID) | INTRAMUSCULAR | Status: DC

## 2013-05-19 MED ORDER — SODIUM CHLORIDE 0.9 % IJ SOLN: 3.0000 mL | INTRAMUSCULAR | Status: DC | PRN

## 2013-05-19 MED ORDER — HEPARIN SODIUM (PORCINE) 1000 UNIT/ML IJ SOLN
INTRAMUSCULAR | Status: AC
  Filled 2013-05-19: qty 1

## 2013-05-19 MED ORDER — FENTANYL CITRATE 0.05 MG/ML IJ SOLN
INTRAMUSCULAR | Status: AC
  Filled 2013-05-19: qty 2

## 2013-05-19 MED ORDER — OXYCODONE HCL 5 MG PO TABS
5.0000 mg | ORAL_TABLET | ORAL | Status: DC | PRN
  Administered 2013-05-19 – 2013-05-20 (×2): 5 mg via ORAL
  Filled 2013-05-19 (×2): qty 1

## 2013-05-19 NOTE — Op Note (Signed)
EPS/RFA of AVRT utilizing a left lateral AP without immediate complication. [id number].

## 2013-05-19 NOTE — Interval H&P Note (Signed)
History and Physical Interval Note:  05/19/2013 1:36 PM  Laura Wheeler  has presented today for surgery, with the diagnosis of SVT  The various methods of treatment have been discussed with the patient and family. After consideration of risks, benefits and other options for treatment, the patient has consented to  Procedure(s): SUPRAVENTRICULAR TACHYCARDIA ABLATION (N/A) as a surgical intervention .  The patient's history has been reviewed, patient examined, no change in status, stable for surgery.  I have reviewed the patient's chart and labs.  Questions were answered to the patient's satisfaction.     Lewayne Bunting

## 2013-05-19 NOTE — H&P (View-Only) (Signed)
HPI Laura Wheeler is referred today by Dr. Hilty for evaluation of WPW syndrome. Her history dates back to her young adult years when she would develop palpitations and sustained heart racing. She subsequently learned that vagal maneuvers would result in the palpitations stopping. During pregnancy, she had worsening SVT although her 2 pregnancies were otherwise uncomplicated. For the last 17 years, she has had one or 2 episodes a week. These episodes start and stop suddenly and the patient has learned that she gets on her side and bears down, the episodes will terminate. She has never had a documented ECG of her SVT. She has not passed out. Most recently, her episodes have lasted for over an hour. In the past, she tried calcium channel blockers which did not improve her symptoms and resulted in constipation.  Allergies  Allergen Reactions  . Sulfa Antibiotics Other (See Comments)    Doesn't remember reaction - 20 years ago     Current Outpatient Prescriptions  Medication Sig Dispense Refill  . etonogestrel-ethinyl estradiol (NUVARING) 0.12-0.015 MG/24HR vaginal ring Place 1 each vaginally every 28 (twenty-eight) days. Insert vaginally and leave in place for 3 consecutive weeks, then remove for 1 week.      . oxyCODONE (OXY IR/ROXICODONE) 5 MG immediate release tablet Take 1-2 tablets (5-10 mg total) by mouth every 4 (four) hours as needed.  30 tablet  0   No current facility-administered medications for this visit.     Past Medical History  Diagnosis Date  . WPW (Wolff-Parkinson-White syndrome)     Dr. H[facility]  . Arthritis     ROS:   All systems reviewed and negative except as noted in the HPI.   Past Surgical History  Procedure Laterality Date  . Excision morton's neuroma Left 05/04/2013    Procedure: LEFT SECOND MT  WEIL/SECOND WEBB SPACE NEUROMA EXCISION ;  Surgeon: John Hewitt, MD;  Location: [facility] OR;  Service: Orthopedics;  Laterality: Left;     No family history on  file.   History   Social History  . Marital Status: Married    Spouse Name: N/A    Number of Children: N/A  . Years of Education: N/A   Occupational History  . Not on file.   Social History Main Topics  . Smoking status: Never Smoker   . Smokeless tobacco: Not on file  . Alcohol Use: 1.8 oz/week    3 Glasses of wine per week  . Drug Use: No  . Sexually Active: Yes    Birth Control/ Protection: Inserts   Other Topics Concern  . Not on file   Social History Narrative  . No narrative on file     BP 112/59  Pulse 83  Ht 5' 6" (1.676 m)  Wt 157 lb (71.215 kg)  BMI 25.35 kg/m2  LMP 05/01/2013  Physical Exam:  Well appearing middle-age woman, NAD HEENT: Unremarkable Neck:  6 cm JVD, no thyromegally Back:  No CVA tenderness Lungs:  Clear with no wheezes, rales, or rhonchi. HEART:  Regular rate rhythm, no murmurs, no rubs, no clicks Abd:  soft, positive bowel sounds, no organomegally, no rebound, no guarding Ext:  2 plus pulses, no edema, no cyanosis, no clubbing, a bruit is present on her foot. Skin:  No rashes no nodules Neuro:  CN II through XII intact, motor grossly intact  EKG - normal sinus rhythm with WPW syndrome   Assess/Plan: 

## 2013-05-20 ENCOUNTER — Encounter (HOSPITAL_COMMUNITY): Payer: Self-pay | Admitting: Physician Assistant

## 2013-05-20 DIAGNOSIS — I456 Pre-excitation syndrome: Secondary | ICD-10-CM

## 2013-05-20 NOTE — Discharge Summary (Signed)
   Discharge Summary   Patient ID: Laura Wheeler MRN: [id number], DOB/AGE: 1961/09/05 52 y.o. Admit date: 05/19/2013 D/C date:     05/20/2013  Primary Cardiologist: Hilty/Taylor- EP  Primary Discharge Diagnoses:  1. WPW - s/p EPS/RF ablation 05/20/13  Secondary Discharge Diagnoses:  1. Arthritis  Hospital Course: Ms. Laura Wheeler is a 52 y/o F with history of WPW and arthritis, otherwise no significant medical history. Her history dates back to her young adult years when she would develop palpitations and sustained heart racing. She subsequently learned that vagal maneuvers would result in the palpitations stopping. During pregnancy, she had worsening SVT although her 2 pregnancies were otherwise uncomplicated. For the last 17 years, she has had one or 2 episodes a week, more recently lasting over an hour. In the past, she tried calcium channel blockers which did not improve her symptoms and resulted in constipation. Ablation was recommended. She was brought in for this procedure yesterday and underwent successful electrophysiologic study and RF catheter ablation of manifest left lateral accessory pathway and causing Wolff-Parkinson-  White syndrome. She tolerated this procedure well. Dr. Diona Browner has seen and examined her and feels she is stable for discharge. I have left a message on our office's scheduling voicemail requesting a follow-up appointment in 2 weeks per Dr. Diona Browner, and our office will call the patient with this appointment.   Discharge Vitals: Blood pressure 95/46, pulse 82, temperature 97.8 F (36.6 C), temperature source Oral, resp. rate 18, height 5\' 6"  (1.676 m), weight 155 lb 10.3 oz (70.6 kg), last menstrual period 05/01/2013, SpO2 97.00%.  Labs: none performed this admission  Diagnostic Studies/Procedures   EPS/RFA as above  Discharge Medications     Medication List    TAKE these medications       etonogestrel-ethinyl estradiol 0.12-0.015 MG/24HR vaginal ring    Commonly known as:  NUVARING  Place 1 each vaginally every 28 (twenty-eight) days.     oxyCODONE 5 MG immediate release tablet  Commonly known as:  Oxy IR/ROXICODONE  Take 5-10 mg by mouth every 4 (four) hours as needed for pain.        Disposition   The patient will be discharged in stable condition to home.     Discharge Orders   Future Orders Complete By Expires     Diet - low sodium heart healthy  As directed     Increase activity slowly  As directed     Comments:      No driving for 2 days. No lifting over 5 lbs for 1 week. No sexual activity for 1 week. You may return to work on Monday 05/22/13 with the above resctrictions. Keep procedure site clean & dry. If you notice increased pain, swelling, bleeding or pus, call/return!  You may shower, but no soaking baths/hot tubs/pools for 1 week.      Follow-up Information   Follow up with Lewayne Bunting, MD. (Our office will call you for a follow-up appointment. Please call the office if you have not heard from [place] within 4 days.)    Contact information:   [address] [place] [address] [phone]         Duration of Discharge Encounter: Greater than 30 minutes including physician and PA time.  Signed, Kriste Basque Lily Velasquez PA-C 05/20/2013, 9:07 AM

## 2013-05-20 NOTE — Discharge Summary (Signed)
Please also refer to my rounding note.

## 2013-05-20 NOTE — Op Note (Signed)
Laura Wheeler, Laura Wheeler NO.:  [id number]  MEDICAL RECORD NO.:  [id number]  LOCATION:  6526                         FACILITY:  [facility]  PHYSICIAN:  Doylene Canning. Ladona Ridgel, MD    DATE OF BIRTH:  1961-02-27  DATE OF PROCEDURE:  05/19/2013 DATE OF DISCHARGE:                              OPERATIVE REPORT   PROCEDURE PERFORMED:  Electrophysiologic study and RF catheter ablation of manifest left lateral accessory pathway and causing Wolff-Parkinson- White syndrome.  INTRODUCTION:  The patient is a 52 year old woman with a history of recurrent tachy palpitations and documented SVT with a left lateral accessory pathway.  She has failed medical therapy and is now referred for catheter ablation.  DESCRIPTION OF PROCEDURE:  After informed consent was obtained, the patient was taken to the diagnostic EP lab in a fasting state.  After usual preparation and draping, intravenous fentanyl and midazolam were given for sedation.  A 6-French hexapolar catheter was inserted percutaneously in the right jugular vein and advanced to the coronary sinus.  A 6-French quadripolar catheter was inserted percutaneously in the right femoral vein and advanced to the right ventricle.  A 6-French quadripolar catheter was inserted percutaneously in the right femoral vein and advanced to the His bundle region.  After measurement of basic intervals, rapid ventricular pacing was carried out from the right ventricle, demonstrating eccentric nondecremental VA conduction with the earliest atrial activation occurring along the lateral portion of the mitral valve annulus at approximately 4 o'clock in the LAO projection. Additional rapid ventricular pacing was carried out, resulting in the induction of SVT at a paced cycle length of 360 milliseconds.  This was a narrow QRS tachycardia at a cycle length of 340 milliseconds.  PVCs replaced at the time of His bundle refractoriness and clearly pre- excited the atrium  and during ventricular pacing, there was a VA AV activation sequence with a diagnosis of AV reentrant tachycardia made utilizing a left lateral accessory pathway.  At this point, the right femoral artery was punctured and a 7-French quadripolar ablation catheter was advanced retrograde across the aortic valve into the left ventricle.  Mapping was carried out along the mitral valve annulus and a total of 3 RF energy applications were delivered to the atrial and ventricular insertion of the accessory pathway.  Following catheter ablation, the patient was observed for approximately 45 minutes and during this time, additional rapid ventricular pacing, rapid atrial pacing, programed atrial and ventricular stimulation were all carried out and there was no inducible SVT.  The catheters were removed. Hemostasis was assured, and the patient was returned to her room in satisfactory condition.  COMPLICATIONS:  There were no immediate procedure complications.  RESULTS:  A.  Baseline ECG demonstrates normal sinus rhythm with ventricular pre-excitation. B.  Baseline intervals.  Sinus node cycle length was 600 milliseconds. The HV interval was 32 milliseconds.  The QRS duration was 105 milliseconds.  The AH interval was 82 milliseconds.  Following ablation, the HV interval was 50 milliseconds.  The QRS duration was 70 milliseconds and the AH interval was 90 milliseconds. C.  Rapid ventricular pacing.  Rapid ventricular pacing was carried out from the right ventricle and stepwise decreased down  to 360 milliseconds where VA Wenckebach was observed.  During rapid ventricular pacing, the atrial activation was eccentric and nondecremental and resulted in the initiation of SVT.  Following catheter ablation, rapid ventricular pacing demonstrated a VA Wenckebach cycle length of 460 milliseconds. D.  Programed ventricular stimulation.  Programed ventricular stimulation was carried out from the right ventricle  at base drive cycle length of 213 milliseconds.  The S1-S2 interval stepwise decreased down to 370 milliseconds with retrograde AV node ERP was observed.  During programed ventricular stimulation, the atrial activation sequence was midline and decremental. E.  Rapid atrial pacing.  Rapid atrial pacing was carried out from the right atrium at base drive cycle length of 086 milliseconds and stepwise decreased down to 330 milliseconds where AV Wenckebach was observed. During rapid atrial pacing, there was intermittent ventricular pre- excitation. F.  Programed atrial stimulation.  Programed atrial stimulation was carried out from the atrium at base drive cycle length of 578 milliseconds in the S1-S2 interval, stepwise decreased down to 230 milliseconds where atrial refractoriness was observed.  During programed atrial stimulation, there was a manifest pre-excitation present.  There was also inducible SVT. G.  Arrhythmias observed. 1. AV reentrant tachycardia initiation was with rapid atrial pacing as     well as rapid ventricular pacing.  Duration of sustained cycle     length was 340 milliseconds.  Method of termination was with rapid     atrial pacing and spontaneous.     a.     Mapping.  Mapping of the patient's accessory pathway      demonstrated the earliest atrial activation sequence during the      SVT to be approximately 4 o'clock in the LAO projection along the      mitral valve annulus.     b.     RF energy application.  Three RF energy applications were      delivered to the atrial and ventricular insertions of the      accessory pathway, resulting in rendering the accessory pathway      nonfunctional and resulting in no residual pre-excitation and now      midline atrial activation.  CONCLUSION:  This study demonstrates successful electrophysiologic study and RF catheter ablation of a manifest left lateral accessory pathway with successful RF energy application applied to  the site on the atrial insertion of the pathway at approximately 4 o'clock on the mitral valve annulus.     Doylene Canning. Ladona Ridgel, MD     GWT/MEDQ  D:  05/19/2013  T:  05/20/2013  Job:  [id number]

## 2013-05-20 NOTE — Progress Notes (Signed)
.     Primary cardiologist: Dr. Zoila Shutter EP: Dr. Lewayne Bunting  Subjective:   Feels well this morning, no palpitations or chest pain. Ready to home.   Objective:   Temp:  [97.8 F (36.6 C)-99.1 F (37.3 C)] 97.8 F (36.6 C) (05/31 0445) Pulse Rate:  [76-91] 82 (05/31 0445) Resp:  [16-20] 18 (05/31 0445) BP: (86-135)/(43-79) 95/46 mmHg (05/31 0450) SpO2:  [95 %-97 %] 97 % (05/31 0445) Weight:  [150 lb (68.04 kg)-155 lb 10.3 oz (70.6 kg)] 155 lb 10.3 oz (70.6 kg) (05/31 0000) Last BM Date: 05/18/13  Filed Weights   05/19/13 1133 05/20/13 0000  Weight: 150 lb (68.04 kg) 155 lb 10.3 oz (70.6 kg)    Intake/Output Summary (Last 24 hours) at 05/20/13 0844 Last data filed at 05/19/13 2200  Gross per 24 hour  Intake    340 ml  Output    700 ml  Net   -360 ml   Telemetry: Sinus rhythm.  Exam:  General: Comfortable.  Lungs: Clear, nonlabored.  Cardiac: RRR, no gallop or rub.  Extremities: No edema.  ECG: Normal sinus rhythm with probable early repolarization.   Medications:    PRN Medications:  acetaminophen, ondansetron (ZOFRAN) IV, oxyCODONE   Assessment:   Wolff-Parkinson-White syndrome status post successful RF ablation of left lateral accessory pathway by Dr. Ladona Ridgel on 5/30.   Plan/Discussion:    Patient is stable this morning and ready to go home. She is otherwise not on any standing cardiac medications. She will need to followup with Dr. Ladona Ridgel within the next few weeks, to avoid heavy lifting and straining over the next week, otherwise gradually resume regular activities.   Jonelle Sidle, M.D., F.A.C.C.

## 2013-05-22 ENCOUNTER — Telehealth: Payer: Self-pay | Admitting: Internal Medicine

## 2013-05-22 NOTE — Telephone Encounter (Signed)
New Problem  Pt had an ablation done on 05/19/13 by Dr Ladona Ridgel, her discharge papers states for her to come back in 1 mth. First thing available is July 3 but pt states she will be out of town. After that he had nothing until August. I offered to schedule her with a PA in that 1 month period but she declined, she only wants to see Dr Ladona Ridgel.

## 2013-05-22 NOTE — Telephone Encounter (Signed)
Patient had ablation on 05/19/13 by Dr. Ladona Ridgel. She is unable to come in for a 1 mth followup appt on July 3rd.  She states that she is okay with following up with Dr. Dannette Barbara and she is asking if she really needs to see Dr. Ladona Ridgel, and if so, what will be done at this appt that Dr. Quintin Alto office can not provide. She was very complimentary of Dr. Ladona Ridgel and the services provided to her but is just wondering if she really needs to come in.

## 2013-05-22 NOTE — Telephone Encounter (Signed)
Routed to Dennis Bast, Charity fundraiser, too.

## 2013-05-22 NOTE — Telephone Encounter (Signed)
Patient awaiting response regarding need for followup appt. S/P Ablation on 05/19/13. See note below. Thank you!

## 2013-06-14 ENCOUNTER — Encounter: Payer: Self-pay | Admitting: Internal Medicine

## 2013-06-14 ENCOUNTER — Ambulatory Visit (INDEPENDENT_AMBULATORY_CARE_PROVIDER_SITE_OTHER): Payer: BC Managed Care – PPO | Admitting: Internal Medicine

## 2013-06-14 VITALS — BP 128/72 | HR 108 | Wt 156.8 lb

## 2013-06-14 DIAGNOSIS — I456 Pre-excitation syndrome: Secondary | ICD-10-CM

## 2013-06-14 NOTE — Progress Notes (Signed)
HPI Laura Wheeler returns today for followup. She is a very pleasant 52 year old woman with a history of SVT and palpitations, and documented WPW syndrome. She underwent elective physiologic study and catheter ablation several weeks ago. Since then she has done well. Her palpitations have resolved and she has no more documented or sustained SVT. She denies chest pain or shortness of breath. No syncope. Allergies  Allergen Reactions  . Sulfa Antibiotics Other (See Comments)    Doesn't remember reaction - 20 years ago     Current Outpatient Prescriptions  Medication Sig Dispense Refill  . etonogestrel-ethinyl estradiol (NUVARING) 0.12-0.015 MG/24HR vaginal ring Place 1 each vaginally every 28 (twenty-eight) days.       Marland Kitchen gabapentin (NEURONTIN) 100 MG capsule       . oxyCODONE (OXY IR/ROXICODONE) 5 MG immediate release tablet Take 5-10 mg by mouth every 4 (four) hours as needed for pain.       No current facility-administered medications for this visit.     Past Medical History  Diagnosis Date  . Arthritis     "knees" (05/19/2013)  . WPW (Wolff-Parkinson-White syndrome) ~ 1983    a. s/p ablation 05/20/13.  Marland Kitchen SVT (supraventricular tachycardia)     ROS:   All systems reviewed and negative except as noted in the HPI.   Past Surgical History  Procedure Laterality Date  . Excision morton's neuroma Left 05/04/2013    Procedure: LEFT SECOND MT  WEIL/SECOND WEBB SPACE NEUROMA EXCISION ;  Surgeon: [name], MD;  Location: [facility] OR;  Service: Orthopedics;  Laterality: Left;  . Supraventricular tachycardia ablation  05/19/2013  . Breast biopsy Left 2000's  . Anal fissure repair  1980's     No family history on file.   History   Social History  . Marital Status: Married    Spouse Name: N/A    Number of Children: N/A  . Years of Education: N/A   Occupational History  . Not on file.   Social History Main Topics  . Smoking status: Never Smoker   . Smokeless tobacco: Never Used   . Alcohol Use: 2.4 oz/week    4 Glasses of wine per week     Comment: 05/19/2013 "6 oz wine, 3 days/wk"  . Drug Use: No  . Sexually Active: Yes    Birth Control/ Protection: Inserts   Other Topics Concern  . Not on file   Social History Narrative  . No narrative on file     BP 128/72  Pulse 108  Wt 156 lb 12.8 oz (71.124 kg)  BMI 25.32 kg/m2  Physical Exam:  Well appearing middle-age woman,NAD HEENT: Unremarkable Neck:  No JVD, no thyromegally Back:  No CVA tenderness Lungs:  Clear with no wheezes, rales, or rhonchi. HEART:  Regular rate rhythm, no murmurs, no rubs, no clicks Abd:  soft, positive bowel sounds, no organomegally, no rebound, no guarding Ext:  2 plus pulses, no edema, no cyanosis, no clubbing Skin:  No rashes no nodules Neuro:  CN II through XII intact, motor grossly intact  EKG normal sinus rhythm with normal axis and intervals  Assess/Plan:

## 2013-06-14 NOTE — Assessment & Plan Note (Signed)
She is status post catheter ablation doing well. She has no evidence of residual preexcitation on her ECG.

## 2013-09-11 ENCOUNTER — Encounter: Payer: Self-pay | Admitting: Neurology

## 2013-09-12 ENCOUNTER — Ambulatory Visit (INDEPENDENT_AMBULATORY_CARE_PROVIDER_SITE_OTHER): Payer: BC Managed Care – PPO | Admitting: Neurology

## 2013-09-12 ENCOUNTER — Encounter: Payer: Self-pay | Admitting: Neurology

## 2013-09-12 VITALS — BP 118/74 | HR 98 | Ht 66.0 in | Wt 156.0 lb

## 2013-09-12 DIAGNOSIS — M792 Neuralgia and neuritis, unspecified: Secondary | ICD-10-CM | POA: Insufficient documentation

## 2013-09-12 DIAGNOSIS — G5790 Unspecified mononeuropathy of unspecified lower limb: Secondary | ICD-10-CM

## 2013-09-12 MED ORDER — GABAPENTIN 100 MG PO CAPS: 100.0000 mg | ORAL_CAPSULE | Freq: Two times a day (BID) | ORAL | Status: DC

## 2013-09-12 MED ORDER — CAPSAICIN 0.075 % EX CREA: TOPICAL_CREAM | Freq: Three times a day (TID) | CUTANEOUS | Status: DC

## 2013-09-12 NOTE — Progress Notes (Signed)
[facility]  Provider:  Dr Laura Wheeler Referring Provider: Lenora Boys, MD Primary Care Physician:  Laura Boys, MD  CC:  Pain after excision of Morton's neuroma  HPI:  Laura Wheeler is a 52 y.o. female here as a referral from Dr. Foy Wheeler for evaluation of continued pain after an excision of a Morton's neuroma.  Surgery on May 15 for Mortons neuroma. Her to surgery was having severe pain in the left foot between the second and third digit. Was causing difficulty with walking, was only a problem when she had shoes on and felt the nerve was being compressed. Did not have any pain without shoes on or when walking barefoot. After surgery is continued to have severe throbbing pain in that location, also notes paresthesias in the anterior surface of her foot going up towards the ankle. Notes that her second toe is in the fixed extension position since the surgery. Has been unable to get that toe to touch the floor. Has followup with her surgeon who informed her that everything well with the surgery.  Continues to have pain and is looking for answers. Currently taking Neurontin 300 mg nightly, Tylenol nightly. Also takes tramadol occasionally for breakthrough pain. Feels like she is walking on a flat pebble all the time.   Has done PT before and after surgery. Has been discharged from PT as they feel there is no further benefit. Started seeing a reflexologist around 5wks ago, has not noted much benefit from this.   Review of Systems: Out of a complete 14 system review, the patient complains of only the following symptoms, and all other reviewed systems are negative. Foot pain  History   Social History  . Marital Status: Married    Spouse Name: Laura Wheeler    Number of Children: 2  . Years of Education: BS   Occupational History  .      Laura Wheeler   Social History Main Topics  . Smoking status: Never Smoker   . Smokeless tobacco: Never Used  . Alcohol Use: No  . Drug Use: No   . Sexual Activity: Yes    Birth Control/ Protection: Inserts   Other Topics Concern  . Not on file   Social History Narrative   Patient is married and lives at home with her husband.  Patient works full time.    Family History  Problem Relation Age of Onset  . Diabetes Mother     Past Medical History  Diagnosis Date  . Arthritis     "knees" (05/19/2013)  . WPW (Wolff-Parkinson-White syndrome) ~ 1983    a. s/p ablation 05/20/13.  Marland Kitchen SVT (supraventricular tachycardia)     Past Surgical History  Procedure Laterality Date  . Excision morton's neuroma Left 05/04/2013    Procedure: LEFT SECOND MT  WEIL/SECOND WEBB SPACE NEUROMA EXCISION ;  Surgeon: Laura Arthurs, MD;  Location: [facility] OR;  Service: Orthopedics;  Laterality: Left;  . Supraventricular tachycardia ablation  05/19/2013  . Breast biopsy Left 2000's  . Anal fissure repair  [id number]    Current Outpatient Prescriptions  Medication Sig Dispense Refill  . acetaminophen (TYLENOL) 500 MG tablet Take 500 mg by mouth 2 (two) times daily.      Marland Kitchen b complex vitamins tablet Take 1 tablet by mouth AC breakfast.      . DiphenhydrAMINE HCl (BENADRYL ALLERGY PO) Take by mouth at bedtime.      Marland Kitchen etonogestrel-ethinyl estradiol (NUVARING) 0.12-0.015 MG/24HR vaginal ring Place 1 each vaginally  every 28 (twenty-eight) days.       Marland Kitchen gabapentin (NEURONTIN) 100 MG capsule as directed.       . meloxicam (MOBIC) 7.5 MG tablet Take 7.5 mg by mouth daily.      . Multiple Vitamin (MULTIVITAMIN) capsule Take 1 capsule by mouth daily. [employer] women's joint health, one packet at lunch time      . traMADol (ULTRAM) 50 MG tablet Take 50 mg by mouth every 6 (six) hours as needed for pain.      . vitamin C (ASCORBIC ACID) 500 MG tablet Take 500 mg by mouth daily.       No current facility-administered medications for this visit.    Allergies as of 09/12/2013 - Review Complete 09/12/2013  Allergen Reaction Noted  . Sulfa antibiotics Other (See Comments)  05/01/2013    Vitals: BP 118/74  Pulse 98  Ht 5\' 6"  (1.676 m)  Wt 156 lb (70.761 kg)  BMI 25.19 kg/m2 Last Weight:  Wt Readings from Last 1 Encounters:  09/12/13 156 lb (70.761 kg)   Last Height:   Ht Readings from Last 1 Encounters:  09/12/13 5\' 6"  (1.676 m)     Physical exam: Exam: Gen: NAD, conversant Eyes: anicteric sclerae, moist conjunctivae HENT: Atraumati Neck: Trachea midline; supple,  Lungs: CTA, no wheezing, rales, rhonic                          CV: RRR, no MRG Abdomen: Soft, non-tender;  Extremities: No peripheral edema , healing surgical excision site on left foot Skin: Normal temperature, no rash,  Psych: Appropriate affect, pleasant  Neuro: Laura: AA&Ox3, appropriately interactive, normal affect   Speech: fluent w/o paraphasic error  Memory: good recent and remote recall  CN: PERRL, EOMI no nystagmus, no ptosis, sensation intact to LT V1-V3 bilat, face symmetric, no weakness, hearing grossly intact, palate elevates symmetrically, shoulder shrug 5/5 bilat,  tongue protrudes midline, no fasiculations noted.  Motor: normal bulk and tone Strength: 5/5  In all extremities, mild fixed extension L 2nd toe  Coord: rapid alternating and point-to-point (FNF, HTS) movements intact.  Reflexes: symmetrical, bilat downgoing toes  Sens: LT intact in all extremities, paresthesias induced in L anterior surface of foot with LT  Gait: posture, stance, stride and arm-swing normal. Tandem gait intact. Able to walk on heels and toes. Romberg absent.   Assessment:  After physical and neurologic examination, review of laboratory studies, imaging, neurophysiology testing and pre-existing records, assessment will be reviewed on the problem list.  Plan:  Treatment plan and additional workup will be reviewed under Problem List.  Laura Wheeler is a pleasant 52y/o woman presenting for initial evaluation of pain since an excision of a Morton's neuroma. Has followed up with  her surgeon, podiatry, PT and reflexology with no symptomatic relief. Scheduled to see a specialist at [facility] in October. Unclear etiology of her symptoms.   1)Neuropathic pain  -will increase Gabapentin to 100mg  in AM, noon and 300mg  qhs -capsaicin cream 2-3 times a day for symptomatic relief -can consider pain management referral in the future for possible injections -scheduled to follow up with specialist at [facility]

## 2013-09-12 NOTE — Patient Instructions (Signed)
Overall you are doing fairly well but I do want to suggest a few things today:    As far as your medications are concerned, I would like to suggest increasing the gabapentin to 100mg  in the morning, afternoon and 300mg  in the evening.  We will add capsaicin cream to help relieve your pain  I suggest you follow up with the surgeon in [facility] for a 2nd opinion. We can consider a pain medicine referral in the future for possible injections to help relieve your pain   Please call us with any interim questions, concerns, problems, updates or refill requests.   Please also call us for any test results so we can go over those with you on the phone.  My clinical assistant and will answer any of your questions and relay your messages to me and also relay most of my messages to you.   Our phone number is [phone]. We also have an after hours call service for urgent matters and there is a physician on-call for urgent questions. For any emergencies you know to call 911 or go to the nearest emergency room

## 2013-09-29 DIAGNOSIS — S99929A Unspecified injury of unspecified foot, initial encounter: Secondary | ICD-10-CM | POA: Insufficient documentation

## 2013-10-26 ENCOUNTER — Other Ambulatory Visit: Payer: Self-pay

## 2013-11-15 ENCOUNTER — Telehealth: Payer: Self-pay | Admitting: Neurology

## 2013-11-15 MED ORDER — GABAPENTIN 300 MG PO CAPS: 300.0000 mg | ORAL_CAPSULE | Freq: Every day | ORAL | Status: DC

## 2013-11-15 NOTE — Telephone Encounter (Signed)
Last OV says: -will increase Gabapentin to 100mg  in AM, noon and 300mg  qhs I called the patient.  She said she would like a separate Rx for 300mg , as she already has a rx for 100mg  bid.

## 2013-11-24 ENCOUNTER — Telehealth: Payer: Self-pay

## 2013-11-24 NOTE — Telephone Encounter (Signed)
Please let her know I am fine with her stopping it. If she wishes to continue on it then she needs to follow up with the doctor who prescribed it. Thanks

## 2013-11-24 NOTE — Telephone Encounter (Signed)
Patient called saying she was previously prescribed Meloxicam 7.5mg  daily.  She would like to know if she should continue taking that med, and if so, can we write the Rx?  Dr Hosie Poisson, please advise.  Thank you.

## 2014-06-21 ENCOUNTER — Other Ambulatory Visit: Payer: Self-pay | Admitting: Obstetrics and Gynecology

## 2014-06-21 DIAGNOSIS — R928 Other abnormal and inconclusive findings on diagnostic imaging of breast: Secondary | ICD-10-CM

## 2014-07-04 ENCOUNTER — Ambulatory Visit
Admission: RE | Admit: 2014-07-04 | Discharge: 2014-07-04 | Disposition: A | Discharge status: Home / Self Care | Payer: 59 | Source: Ambulatory Visit | Attending: Obstetrics and Gynecology | Admitting: Obstetrics and Gynecology

## 2014-07-04 DIAGNOSIS — R928 Other abnormal and inconclusive findings on diagnostic imaging of breast: Secondary | ICD-10-CM

## 2014-09-20 HISTORY — PX: FOOT SURGERY: SHX648

## 2014-11-27 ENCOUNTER — Other Ambulatory Visit: Payer: Self-pay | Admitting: Obstetrics and Gynecology

## 2014-11-27 DIAGNOSIS — R921 Mammographic calcification found on diagnostic imaging of breast: Secondary | ICD-10-CM

## 2014-11-29 ENCOUNTER — Encounter (HOSPITAL_COMMUNITY): Payer: Self-pay | Admitting: Internal Medicine

## 2014-12-13 ENCOUNTER — Encounter: Payer: Self-pay | Admitting: Physician Assistant

## 2014-12-13 ENCOUNTER — Ambulatory Visit (INDEPENDENT_AMBULATORY_CARE_PROVIDER_SITE_OTHER): Payer: 59 | Admitting: Physician Assistant

## 2014-12-13 VITALS — BP 90/70 | HR 87 | Ht 66.0 in | Wt 157.0 lb

## 2014-12-13 DIAGNOSIS — R079 Chest pain, unspecified: Secondary | ICD-10-CM

## 2014-12-13 DIAGNOSIS — R002 Palpitations: Secondary | ICD-10-CM

## 2014-12-13 DIAGNOSIS — I456 Pre-excitation syndrome: Secondary | ICD-10-CM

## 2014-12-13 LAB — D-DIMER, QUANTITATIVE (NOT AT ARMC): D DIMER QUANT: 0.36 ug{FEU}/mL (ref 0.00–0.48)

## 2014-12-13 LAB — CBC
HCT: 39.6 % (ref 36.0–46.0)
Hemoglobin: 12.9 g/dL (ref 12.0–15.0)
MCHC: 32.6 g/dL (ref 30.0–36.0)
MCV: 92.3 fl (ref 78.0–100.0)
PLATELETS: 419 10*3/uL — AB (ref 150.0–400.0)
RBC: 4.29 Mil/uL (ref 3.87–5.11)
RDW: 13.5 % (ref 11.5–15.5)
WBC: 8 10*3/uL (ref 4.0–10.5)

## 2014-12-13 LAB — BASIC METABOLIC PANEL
BUN: 15 mg/dL (ref 6–23)
CALCIUM: 9.8 mg/dL (ref 8.4–10.5)
CO2: 24 mEq/L (ref 19–32)
Chloride: 105 mEq/L (ref 96–112)
Creatinine, Ser: 0.6 mg/dL (ref 0.4–1.2)
GFR: 104.89 mL/min (ref >60.00)
GLUCOSE: 84 mg/dL (ref 70–99)
Potassium: 4.3 mEq/L (ref 3.5–5.1)
SODIUM: 141 meq/L (ref 135–145)

## 2014-12-13 LAB — TROPONIN I: TNIDX: 0 ug/L (ref 0.00–0.06)

## 2014-12-13 LAB — TSH: TSH: 1.68 u[IU]/mL (ref 0.35–4.50)

## 2014-12-13 NOTE — Progress Notes (Signed)
Cardiology Office Note   Date:  12/13/2014   ID:  Laura KannerRobin D Tanabe, DOB 1961-12-02, MRN [id number]  PCP:  Terressa KoyanagiKIM, HANNAH R., DO  Cardiologist:  Dr. Lewayne BuntingGregg Taylor     History of Present Illness: Laura Wheeler is a 53 y.o. female with a history of SVT and palpitations and documented WPW syndrome status post EPS and RFCA in 04/2013. She was last seen by Dr. Ladona Ridgelaylor 05/2013.   She returns for evaluation of palpitations and chest discomfort.  She has been noticing a hard heart beat over the past week. She notes tightness in her chest with this.  It is constant.  She denies any associated symptoms.  She denies exertional chest discomfort.  She denies syncope.  She denies pleuritic chest pain.  She denies chest pain with lying supine.  She denies hemoptysis.  She does have a long hx of L foot surgeries.  The [facility].  Her activity is somewhat limited. she did travel to [place] 2 weeks ago.  She rode in a car.  she denies any increased edema in her L leg.  She denies orthopnea, PND, edema.  Recent Labs: No results found for requested labs within last 365 days.    Recent Radiology: No results found.    Wt Readings from Last 3 Encounters:  12/13/14 157 lb (71.215 kg)  09/12/13 156 lb (70.761 kg)  06/14/13 156 lb 12.8 oz (71.124 kg)     Past Medical History  Diagnosis Date  . Arthritis     "knees" (05/19/2013)  . WPW (Wolff-Parkinson-White syndrome) ~ 1983    a. s/p ablation 05/20/13.  Marland Kitchen. SVT (supraventricular tachycardia)     Past Surgical History  Procedure Laterality Date  . Excision morton's neuroma Left 05/04/2013    Procedure: LEFT SECOND MT  WEIL/SECOND WEBB SPACE NEUROMA EXCISION ;  Surgeon: Toni ArthursJohn Hewitt, MD;  Location: [facility] OR;  Service: Orthopedics;  Laterality: Left;  . Supraventricular tachycardia ablation  05/19/2013  . Breast biopsy Left 2000's  . Anal fissure repair  1980's  . Supraventricular tachycardia ablation N/A 05/19/2013   Procedure: SUPRAVENTRICULAR TACHYCARDIA ABLATION;  Surgeon: Marinus MawGregg W Taylor, MD;  [facility];  Service: Cardiovascular;  Laterality: N/A;     Current Outpatient Prescriptions  Medication Sig Dispense Refill  . acetaminophen (TYLENOL) 500 MG tablet Take 500 mg by mouth 2 (two) times daily.    Marland Kitchen. aspirin 81 MG EC tablet Take 81 mg by mouth daily.  0  . b complex vitamins tablet Take 1 tablet by mouth AC breakfast.    . DiphenhydrAMINE HCl (BENADRYL ALLERGY PO) Take by mouth at bedtime.    . gabapentin (NEURONTIN) 100 MG capsule Take 1 capsule (100 mg total) by mouth 2 (two) times daily before a meal. 60 capsule 3  . Multiple Vitamin (MULTIVITAMIN) capsule Take 1 capsule by mouth daily. [employer] women's joint health, one packet at lunch time    . vitamin C (ASCORBIC ACID) 500 MG tablet Take 500 mg by mouth daily.     No current facility-administered medications for this visit.     Allergies:   Review of patient's allergies indicates no known allergies.   Social History:  The patient  reports that she has never smoked. She has never used smokeless tobacco. She reports that she does not drink alcohol or use illicit drugs.   Family History:  The patient's family history includes Diabetes in her mother. There is no history  of Heart attack or Stroke.    ROS:  Please see the history of present illness.   No fever, chills, cough, melena, hematochezia.   All other systems reviewed and negative.    PHYSICAL EXAM: VS:  BP 90/70 mmHg  Pulse 87  Ht 5\' 6"  (1.676 m)  Wt 157 lb (71.215 kg)  BMI 25.35 kg/m2  LMP 05/01/2013  O2 96 % on RA  Well nourished, well developed, in no acute distress HEENT: normal Neck: no JVD Endo:  No TM Cardiac:  normal S1, S2;  RRR; no murmur   Lungs:   clear to auscultation bilaterally, no wheezing, rhonchi or rales Abd: soft, nontender, no hepatomegaly Ext:  no edema Skin: warm and dry Neuro:  CNs 2-12 intact, no focal abnormalities noted  EKG:  NSR, HR  87, normal axis, j point elevation noted, this is similar to prior tracing 05/20/13      ASSESSMENT AND PLAN:   1. WPW syndrome status post RF catheter ablation:  No evidence of pre-excitation on ECG. 2.  Chest Pain:  Symptoms are atypical for ischemia.  She has low risk for DVT/PE.  However, she has had multiple procedures on her L leg and did travel to [place] recently.  She has very atypical symptoms for ischemia.  She has j point elevation on her ECG but this is similar to an ECG done in 2014 without significant change.  I doubt ischemia.  Her RFs are very low.  She denies exertional pain.  She has had constant discomfort for 1 week.  I suspect she is having PAC/PVCs that are symptomatic.  However, I cannot completely rule out other causes.    -  BMET, CBC, TSH, stat DDimer, stat Troponin today.    -  If Troponin is elevated, she will be referred to the ED.    -  If DDimer is elevated, she will need a Chest CTA.    -  Obtain Echocardiogram. 3.  Palpitations:  Suspect symptomatic PVC/PACs.      -  Workup as above.    -  Obtain 48 hr Holter.  Disposition:   FU with Dr. Lewayne BuntingGregg Taylor 3-4 weeks.    Signed, Brynda RimScott Kanai Berrios, PA-C, MHS 12/13/2014 9:20 AM    [facility] [address], [place], [place]  [address] Phone: [phone]; Fax: [phone]

## 2014-12-13 NOTE — Patient Instructions (Signed)
Your physician recommends that you continue on your current medications as directed. Please refer to the Current Medication list given to you today.   Your physician recommends that you return for lab work in:  Today cbc bmet tsh troponin and d dimer    Your physician has requested that you have an echocardiogram. Echocardiography is a painless test that uses sound waves to create images of your heart. It provides your doctor with information about the size and shape of your heart and how well your heart's chambers and valves are working. This procedure takes approximately one hour. There are no restrictions for this procedure.   Your physician has recommended that you wear a  48 hour holter monitor. Holter monitors are medical devices that record the heart's electrical activity. Doctors most often use these monitors to diagnose arrhythmias. Arrhythmias are problems with the speed or rhythm of the heartbeat. The monitor is a small, portable device. You can wear one while you do your normal daily activities. This is usually used to diagnose what is causing palpitations/syncope (passing out).   Your physician recommends that you schedule a follow-up appointment in:   WITH DR Ladona RidgelAYLOR IN 3 TO 4 WEEKS

## 2014-12-18 ENCOUNTER — Ambulatory Visit (HOSPITAL_COMMUNITY): Payer: 59 | Attending: Cardiovascular Disease

## 2014-12-18 ENCOUNTER — Encounter (INDEPENDENT_AMBULATORY_CARE_PROVIDER_SITE_OTHER): Payer: 59

## 2014-12-18 ENCOUNTER — Encounter: Payer: Self-pay | Admitting: *Deleted

## 2014-12-18 DIAGNOSIS — I456 Pre-excitation syndrome: Secondary | ICD-10-CM | POA: Diagnosis not present

## 2014-12-18 DIAGNOSIS — R079 Chest pain, unspecified: Secondary | ICD-10-CM | POA: Diagnosis not present

## 2014-12-18 DIAGNOSIS — R002 Palpitations: Secondary | ICD-10-CM

## 2014-12-18 NOTE — Progress Notes (Signed)
2D Echo completed. 12/18/2014 

## 2014-12-18 NOTE — Progress Notes (Signed)
Patient ID: Laura KannerRobin D Frutiger, female   DOB: 25-Aug-1961, 53 y.o.   MRN: [id number] [facility] 48 hour holter monitor to be applied to patient.

## 2014-12-19 ENCOUNTER — Encounter: Payer: Self-pay | Admitting: Physician Assistant

## 2014-12-27 ENCOUNTER — Ambulatory Visit (INDEPENDENT_AMBULATORY_CARE_PROVIDER_SITE_OTHER): Payer: 59 | Admitting: Family Medicine

## 2014-12-27 ENCOUNTER — Encounter: Payer: Self-pay | Admitting: Family Medicine

## 2014-12-27 VITALS — BP 98/70 | HR 90 | Temp 98.2°F | Ht 65.0 in | Wt 159.2 lb

## 2014-12-27 DIAGNOSIS — I456 Pre-excitation syndrome: Secondary | ICD-10-CM

## 2014-12-27 DIAGNOSIS — M2390 Unspecified internal derangement of unspecified knee: Secondary | ICD-10-CM

## 2014-12-27 DIAGNOSIS — Z9189 Other specified personal risk factors, not elsewhere classified: Secondary | ICD-10-CM

## 2014-12-27 DIAGNOSIS — G5792 Unspecified mononeuropathy of left lower limb: Secondary | ICD-10-CM

## 2014-12-27 DIAGNOSIS — Z7189 Other specified counseling: Secondary | ICD-10-CM

## 2014-12-27 DIAGNOSIS — M792 Neuralgia and neuritis, unspecified: Secondary | ICD-10-CM

## 2014-12-27 DIAGNOSIS — J309 Allergic rhinitis, unspecified: Secondary | ICD-10-CM

## 2014-12-27 DIAGNOSIS — Z7689 Persons encountering health services in other specified circumstances: Secondary | ICD-10-CM

## 2014-12-27 DIAGNOSIS — M238X9 Other internal derangements of unspecified knee: Secondary | ICD-10-CM

## 2014-12-27 NOTE — Progress Notes (Signed)
Pre visit review using our clinic review tool, if applicable. No additional management support is needed unless otherwise documented below in the visit note. 

## 2014-12-27 NOTE — Progress Notes (Signed)
HPI:  Laura Wheeler is here to establish care. Used to go to [place] and was unhappy that she always had to see a PA. Last PCP and physical: sees carol curtis  Has the following chronic problems that require follow up and concerns today:  Palpitations and atypical CP: -in Dec and currently seeing cardiologist with extensive workup per review of records -hx WPW s/p ablation, hx SVT -has appt with cardiologist for f/u -denies: CP, SOB, DOE today  Hx Chronic L foot pain: -has seen multiple specialists and had several foot surgeries for morton neuroma and damage from this surgery -chronic nerve pain from this, sees (Dr. Rudean Haskell in [facility]) -weaning off of neuronitin -see reflexologist as well  Seasonal allergies: -uses OTC allergy medications prn - benadryl  Mentioned after visit:  Chronic bilat crepitus knees: -a little stiff in am -not really pain, no weakness or numbness  ROS negative for unless reported above: fevers, unintentional weight loss, hearing or vision loss, chest pain, palpitations, struggling to breath, hemoptysis, melena, hematochezia, hematuria, falls, loc, si, thoughts of self harm  Past Medical History  Diagnosis Date  . Arthritis     "knees" (05/19/2013)  . WPW (Wolff-Parkinson-White syndrome) ~ 1983    a. s/p ablation 05/20/13.  Marland Kitchen SVT (supraventricular tachycardia)   . Hx of echocardiogram     Echo (12/15):  EF 60-65%, no RWMA, Gr 1 DD    Past Surgical History  Procedure Laterality Date  . Excision morton's neuroma Left 05/04/2013    Procedure: LEFT SECOND MT  WEIL/SECOND WEBB SPACE NEUROMA EXCISION ;  Surgeon: [name], MD;  Location: [facility] OR;  Service: Orthopedics;  Laterality: Left;  . Supraventricular tachycardia ablation  05/19/2013  . Breast biopsy Left 2000's  . Anal fissure repair  1980's  . Supraventricular tachycardia ablation N/A 05/19/2013    Procedure: SUPRAVENTRICULAR TACHYCARDIA ABLATION;  Surgeon: Marinus Maw, MD;  Location:  [facility] CATH LAB;  Service: Cardiovascular;  Laterality: N/A;    Family History  Problem Relation Age of Onset  . Diabetes Mother   . Heart attack Neg Hx     History   Social History  . Marital Status: Married    Spouse Name: Loraine Leriche    Number of Children: 2  . Years of Education: BS   Occupational History  .      [employer]   Social History Main Topics  . Smoking status: Never Smoker   . Smokeless tobacco: Never Used  . Alcohol Use: 2.4 oz/week    4 Glasses of wine per week     Comment: 1-2 glasses occassionaly  . Drug Use: No  . Sexual Activity: Yes    Birth Control/ Protection: Inserts   Other Topics Concern  . None   Social History Narrative   Patient is married and lives at home with her husband.  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    No regular exercise - diet is ok.       Current outpatient prescriptions: aspirin 81 MG EC tablet, Take 81 mg by mouth daily., Disp: , Rfl: 0;  gabapentin (NEURONTIN) 100 MG capsule, Take 1 capsule (100 mg total) by mouth 2 (two) times daily before a meal., Disp: 60 capsule, Rfl: 3;  Misc Natural Products (OSTEO BI-FLEX ADV TRIPLE ST) TABS, Take by mouth., Disp: , Rfl:   EXAM:  Filed Vitals:   12/27/14 1438  BP: 98/70  Pulse: 106  Temp: 98.2 F (36.8  C)    Body mass index is 26.49 kg/(m^2).  GENERAL: vitals reviewed and listed above, alert, oriented, appears well hydrated and in no acute distress  HEENT: atraumatic, conjunttiva clear, no obvious abnormalities on inspection of external nose and ears  NECK: no obvious masses on inspection  LUNGS: clear to auscultation bilaterally, no wheezes, rales or rhonchi, good air movement  CV: HRRR, no peripheral edema  MS: moves all extremities without noticeable abnormality, gait normal well healed surgical scares l foot, stable knees, no TTP, mild blat patella crepitus and VMP atrophy mild, knee exams normal otherwise  PSYCH: pleasant and  cooperative, no obvious depression or anxiety  ASSESSMENT AND PLAN:  Discussed the following assessment and plan:  Encounter to establish care  WOLFF (WOLFE)-PARKINSON-WHITE (WPW) SYNDROME - Plan: Lipid Panel  Neuropathic pain of left foot  Allergic rhinitis, unspecified allergic rhinitis type  Sedentary lifestyle - Plan: Lipid Panel  Knee crepitus, unspecified laterality  -We reviewed the PMH, PSH, FH, SH, Meds and Allergies. -We provided refills for any medications we will prescribe as needed. -We addressed current concerns per orders and patient instructions. -We have asked for records for pertinent exams, studies, vaccines and notes from previous providers. -We have advised patient to follow up per instructions below. -most issues managed by specialists -check lipids -advised regular CV exercise and healthy diet -PFS HEP for knees, formal PT if needed, follow up prn   -Patient advised to return or notify a doctor immediately if symptoms worsen or persist or new concerns arise.  Patient Instructions  BEFORE YOU LEAVE: -Patellofemoral knee exercises -lab appointment for fasting cholesteorl  Follow up yearly and as needed     KIM, HANNAH R.

## 2014-12-27 NOTE — Patient Instructions (Signed)
BEFORE YOU LEAVE: -Patellofemoral knee exercises -lab appointment for fasting cholesteorl  Follow up yearly and as needed

## 2014-12-31 ENCOUNTER — Ambulatory Visit: Payer: 59 | Admitting: Physician Assistant

## 2015-01-01 ENCOUNTER — Ambulatory Visit: Payer: 59 | Admitting: Family Medicine

## 2015-01-04 ENCOUNTER — Ambulatory Visit
Admission: RE | Admit: 2015-01-04 | Discharge: 2015-01-04 | Disposition: A | Discharge status: Home / Self Care | Payer: 59 | Source: Ambulatory Visit | Attending: Obstetrics and Gynecology | Admitting: Obstetrics and Gynecology

## 2015-01-04 DIAGNOSIS — R921 Mammographic calcification found on diagnostic imaging of breast: Secondary | ICD-10-CM

## 2015-01-10 ENCOUNTER — Other Ambulatory Visit (INDEPENDENT_AMBULATORY_CARE_PROVIDER_SITE_OTHER): Payer: 59

## 2015-01-10 DIAGNOSIS — I456 Pre-excitation syndrome: Secondary | ICD-10-CM

## 2015-01-10 LAB — LIPID PANEL
CHOL/HDL RATIO: 3
Cholesterol: 137 mg/dL (ref 0–200)
HDL: 48 mg/dL (ref >39.00)
LDL CALC: 55 mg/dL (ref 0–99)
NONHDL: 89
Triglycerides: 169 mg/dL — ABNORMAL HIGH (ref 0.0–149.0)
VLDL: 33.8 mg/dL (ref 0.0–40.0)

## 2015-01-15 ENCOUNTER — Encounter: Payer: Self-pay | Admitting: Internal Medicine

## 2015-01-15 ENCOUNTER — Ambulatory Visit (INDEPENDENT_AMBULATORY_CARE_PROVIDER_SITE_OTHER): Payer: 59 | Admitting: Internal Medicine

## 2015-01-15 VITALS — BP 98/62 | HR 84 | Ht 65.0 in | Wt 159.4 lb

## 2015-01-15 DIAGNOSIS — R002 Palpitations: Secondary | ICD-10-CM

## 2015-01-15 DIAGNOSIS — I456 Pre-excitation syndrome: Secondary | ICD-10-CM

## 2015-01-15 NOTE — Progress Notes (Signed)
HPI Laura Wheeler returns today for followup. She is a very pleasant 54 year old woman with a history of SVT and palpitations, and documented WPW syndrome. She underwent electrophysiologic study and catheter ablation several months ago. She denies chest pain or shortness of breath. No syncope. Over the holidays, she experienced recurrent palpitations different from her previous symptoms. She wore a cardiac monitor which demonstrated PVC's and brief busts of atrial tachy (3-8 beats at a time). She had been under extra stress which has now resolved. Her symptoms have improved. Allergies  Allergen Reactions  . Sulfa Antibiotics Itching    Doesn't remember reaction - 20 years ago     Current Outpatient Prescriptions  Medication Sig Dispense Refill  . aspirin 81 MG EC tablet Take 81 mg by mouth daily.  0  . diphenhydrAMINE (BENADRYL) 25 MG tablet Take 12.5-25 mg by mouth at bedtime as needed for sleep.    Marland Kitchen gabapentin (NEURONTIN) 100 MG capsule Take 100 mg by mouth at bedtime.    . Glucosamine-MSM-Hyaluronic Acd (JOINT HEALTH PO) Take 1 packet by mouth daily. [employer] Women's Joint Health Pack    . GuaiFENesin (MUCINEX PO) Take 1 tablet by mouth daily. Unsure if it has a decongestant in it    . Misc Natural Products (OSTEO BI-FLEX ADV TRIPLE ST) TABS Take 1 tablet by mouth daily.     . NON FORMULARY Take 1 tablet by mouth at bedtime. NyQuil     No current facility-administered medications for this visit.     Past Medical History  Diagnosis Date  . Arthritis     "knees" (05/19/2013)  . WPW (Wolff-Parkinson-White syndrome) ~ 1983    a. s/p ablation 05/20/13.  Marland Kitchen SVT (supraventricular tachycardia)   . Hx of echocardiogram     Echo (12/15):  EF 60-65%, no RWMA, Gr 1 DD    ROS:   All systems reviewed and negative except as noted in the HPI.   Past Surgical History  Procedure Laterality Date  . Excision morton's neuroma Left 05/04/2013    Procedure: LEFT SECOND MT  WEIL/SECOND WEBB SPACE  NEUROMA EXCISION ;  Surgeon: [name], MD;  Location: [facility] OR;  Service: Orthopedics;  Laterality: Left;  . Supraventricular tachycardia ablation  05/19/2013  . Breast biopsy Left 2000's  . Anal fissure repair  1980's  . Supraventricular tachycardia ablation N/A 05/19/2013    Procedure: SUPRAVENTRICULAR TACHYCARDIA ABLATION;  Surgeon: Marinus Maw, MD;  Location: [facility] CATH LAB;  Service: Cardiovascular;  Laterality: N/A;     Family History  Problem Relation Age of Onset  . Diabetes Mother   . Heart attack Neg Hx      History   Social History  . Marital Status: Married    Spouse Name: Loraine Leriche    Number of Children: 2  . Years of Education: BS   Occupational History  .      [employer]   Social History Main Topics  . Smoking status: Never Smoker   . Smokeless tobacco: Never Used  . Alcohol Use: 2.4 oz/week    4 Glasses of wine per week     Comment: 1-2 glasses occassionaly  . Drug Use: No  . Sexual Activity: Yes    Birth Control/ Protection: Inserts   Other Topics Concern  . Not on file   Social History Narrative   Patient is married and lives at home with her husband.  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    No regular  exercise - diet is ok.        BP 98/62 mmHg  Pulse 84  Ht 5\' 5"  (1.651 m)  Wt 159 lb 6.4 oz (72.303 kg)  BMI 26.53 kg/m2  LMP 09/20/2014  Physical Exam:  Well appearing middle-age woman,NAD HEENT: Unremarkable Neck:  No JVD, no thyromegally Back:  No CVA tenderness Lungs:  Clear with no wheezes, rales, or rhonchi. HEART:  Regular rate rhythm, no murmurs, no rubs, no clicks Abd:  soft, positive bowel sounds, no organomegally, no rebound, no guarding Ext:  2 plus pulses, no edema, no cyanosis, no clubbing Skin:  No rashes no nodules Neuro:  CN II through XII intact, motor grossly intact  Cardiac monitor - nsr with PVCs and NS atrial tachycardia  Assess/Plan:

## 2015-01-15 NOTE — Assessment & Plan Note (Signed)
Cardiac monitor and ECG demonstrate no evidence of recurrence of her WPW syndrome. Will follow.

## 2015-01-15 NOTE — Patient Instructions (Signed)
Your physician recommends that you schedule a follow-up appointment as needed with Dr Taylor  

## 2015-01-15 NOTE — Assessment & Plan Note (Signed)
Her symptoms are well controlled. She has had less stress. I have asked her to undergo watchful waiting. If her palpitations become more bothersome, we will consider adding a beta blocker or even flecainide.

## 2015-05-09 ENCOUNTER — Ambulatory Visit (INDEPENDENT_AMBULATORY_CARE_PROVIDER_SITE_OTHER): Payer: 59 | Admitting: Family Medicine

## 2015-05-09 ENCOUNTER — Ambulatory Visit (INDEPENDENT_AMBULATORY_CARE_PROVIDER_SITE_OTHER)
Admission: RE | Admit: 2015-05-09 | Discharge: 2015-05-09 | Disposition: A | Discharge status: Home / Self Care | Payer: 59 | Source: Ambulatory Visit | Attending: Family Medicine | Admitting: Family Medicine

## 2015-05-09 ENCOUNTER — Encounter: Payer: Self-pay | Admitting: Family Medicine

## 2015-05-09 VITALS — BP 98/62 | HR 97 | Temp 98.3°F | Ht 65.0 in | Wt 150.6 lb

## 2015-05-09 DIAGNOSIS — R0789 Other chest pain: Secondary | ICD-10-CM

## 2015-05-09 MED ORDER — CYCLOBENZAPRINE HCL 5 MG PO TABS: 5.0000 mg | ORAL_TABLET | Freq: Three times a day (TID) | ORAL | Status: DC | PRN

## 2015-05-09 NOTE — Patient Instructions (Signed)
-  go get the xray  -tylenol 500-1000mg  up to 3 times daily or aleve up to twice daily  -muscle relaxer at night if needed  -heat 15 minutes twice daily   -follow up in 3-4 week if persists or sooner if needed

## 2015-05-09 NOTE — Progress Notes (Signed)
Pre visit review using our clinic review tool, if applicable. No additional management support is needed unless otherwise documented below in the visit note. 

## 2015-05-09 NOTE — Progress Notes (Signed)
HPI:  Acute visit for:  Rib Pain: -started 4 days ago -slipped on wet floor in bathroom and caught herself on toilet and hit lower L ribs and back on toilet - no bruising -has had pain in L flank and back since -reports feels ok when standing and sitting, but when getting up and down feels pain here -ibuprofen helps Denies: nausea, vomiting, weakness, numbness, pain with taking a deep breath   ROS: See pertinent positives and negatives per HPI.  Past Medical History  Diagnosis Date  . Arthritis     "knees" (05/19/2013)  . WPW (Wolff-Parkinson-White syndrome) ~ 1983    a. s/p ablation 05/20/13.  Marland Kitchen. SVT (supraventricular tachycardia)   . Hx of echocardiogram     Echo (12/15):  EF 60-65%, no RWMA, Gr 1 DD    Past Surgical History  Procedure Laterality Date  . Excision morton's neuroma Left 05/04/2013    Procedure: LEFT SECOND MT  WEIL/SECOND WEBB SPACE NEUROMA EXCISION ;  Surgeon: Toni ArthursJohn Hewitt, MD;  Location: [facility] OR;  Service: Orthopedics;  Laterality: Left;  . Supraventricular tachycardia ablation  05/19/2013  . Breast biopsy Left 2000's  . Anal fissure repair  1980's  . Supraventricular tachycardia ablation N/A 05/19/2013    Procedure: SUPRAVENTRICULAR TACHYCARDIA ABLATION;  Surgeon: Marinus MawGregg W Taylor, MD;  [facility];  Service: Cardiovascular;  Laterality: N/A;    Family History  Problem Relation Age of Onset  . Diabetes Mother   . Heart attack Neg Hx     History   Social History  . Marital Status: Married    Spouse Name: Loraine LericheMark  . Number of Children: 2  . Years of Education: BS   Occupational History  .      [employer]   Social History Main Topics  . Smoking status: Never Smoker   . Smokeless tobacco: Never Used  . Alcohol Use: 2.4 oz/week    4 Glasses of wine per week     Comment: 1-2 glasses occassionaly  . Drug Use: No  . Sexual Activity: Yes    Birth Control/ Protection: Inserts   Other Topics Concern  . None   Social History Narrative   Patient  is married and lives at home with her husband.  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    No regular exercise - diet is ok.        Current outpatient prescriptions:  .  aspirin 81 MG EC tablet, Take 81 mg by mouth daily., Disp: , Rfl: 0 .  diphenhydrAMINE (BENADRYL) 25 MG tablet, Take 12.5-25 mg by mouth at bedtime as needed for sleep., Disp: , Rfl:  .  Glucosamine-MSM-Hyaluronic Acd (JOINT HEALTH PO), Take 1 packet by mouth daily. [employer] Women's Joint Health Pack, Disp: , Rfl:  .  Misc Natural Products (OSTEO BI-FLEX ADV TRIPLE ST) TABS, Take 1 tablet by mouth daily. , Disp: , Rfl:  .  NON FORMULARY, Take 1 tablet by mouth at bedtime. NyQuil, Disp: , Rfl:  .  cyclobenzaprine (FLEXERIL) 5 MG tablet, Take 1 tablet (5 mg total) by mouth 3 (three) times daily as needed for muscle spasms., Disp: 15 tablet, Rfl: 0  EXAM:  Filed Vitals:   05/09/15 1341  BP: 98/62  Pulse: 97  Temp: 98.3 F (36.8 C)    Body mass index is 25.06 kg/(m^2).  GENERAL: vitals reviewed and listed above, alert, oriented, appears well hydrated and in no acute distress  HEENT: atraumatic, conjunttiva clear, no  obvious abnormalities on inspection of external nose and ears  NECK: no obvious masses on inspection  LUNGS: clear to auscultation bilaterally, no wheezes, rales or rhonchi, good air movement  CV: HRRR, no peripheral edema  MS: moves all extremities without noticeable abnormality; TTP in soft tissues even with light palpation very superficially of post and later L lower rib cage, no specific point bony TTP  SKIN:no bruising or swelling or signs of trauma on the skin  ABD: BS+, soft, NTTP  PSYCH: pleasant and cooperative, no obvious depression or anxiety  ASSESSMENT AND PLAN:  Discussed the following assessment and plan:  Left-sided chest wall pain - Plan: DG Chest 2 View, cyclobenzaprine (FLEXERIL) 5 MG tablet  -she is concerned for rib fx, will get CXR -suspect  soft tissue/muscle strain -advised OTC analgesics which have helped and muscle relaxer -Patient advised to return or notify a doctor immediately if symptoms worsen or persist or new concerns arise.  Patient Instructions  -go get the xray  -tylenol 500-1000mg  up to 3 times daily or aleve up to twice daily  -muscle relaxer at night if needed  -heat 15 minutes twice daily   -follow up in 3-4 week if persists or sooner if needed     KIM, HANNAH R.

## 2015-05-10 ENCOUNTER — Other Ambulatory Visit: Payer: Self-pay | Admitting: Obstetrics and Gynecology

## 2015-05-10 DIAGNOSIS — R920 Mammographic microcalcification found on diagnostic imaging of breast: Secondary | ICD-10-CM

## 2015-06-06 ENCOUNTER — Other Ambulatory Visit: Payer: Self-pay | Admitting: Obstetrics and Gynecology

## 2015-06-06 ENCOUNTER — Other Ambulatory Visit: Payer: Self-pay

## 2015-06-06 DIAGNOSIS — R920 Mammographic microcalcification found on diagnostic imaging of breast: Secondary | ICD-10-CM

## 2015-06-07 ENCOUNTER — Ambulatory Visit
Admission: RE | Admit: 2015-06-07 | Discharge: 2015-06-07 | Disposition: A | Discharge status: Home / Self Care | Payer: 59 | Source: Ambulatory Visit | Attending: Obstetrics and Gynecology | Admitting: Obstetrics and Gynecology

## 2015-06-07 ENCOUNTER — Other Ambulatory Visit: Payer: Self-pay | Admitting: Obstetrics and Gynecology

## 2015-06-07 DIAGNOSIS — R920 Mammographic microcalcification found on diagnostic imaging of breast: Secondary | ICD-10-CM

## 2015-06-26 ENCOUNTER — Encounter: Payer: Self-pay | Admitting: *Deleted

## 2015-07-12 ENCOUNTER — Encounter: Payer: Self-pay | Admitting: Internal Medicine

## 2015-07-22 ENCOUNTER — Other Ambulatory Visit: Payer: Self-pay | Admitting: Obstetrics and Gynecology

## 2015-07-23 LAB — CYTOLOGY - PAP

## 2015-11-20 ENCOUNTER — Ambulatory Visit (INDEPENDENT_AMBULATORY_CARE_PROVIDER_SITE_OTHER): Payer: Commercial Managed Care - HMO | Admitting: Family Medicine

## 2015-11-20 ENCOUNTER — Encounter: Payer: Self-pay | Admitting: Family Medicine

## 2015-11-20 VITALS — BP 90/70 | HR 80 | Temp 98.2°F | Ht 65.0 in | Wt 136.6 lb

## 2015-11-20 DIAGNOSIS — R300 Dysuria: Secondary | ICD-10-CM

## 2015-11-20 DIAGNOSIS — R319 Hematuria, unspecified: Secondary | ICD-10-CM

## 2015-11-20 LAB — URINALYSIS, MICROSCOPIC ONLY

## 2015-11-20 LAB — POCT URINALYSIS DIPSTICK
BILIRUBIN UA: NEGATIVE
GLUCOSE UA: NEGATIVE
Ketones, UA: NEGATIVE
Nitrite, UA: NEGATIVE
Spec Grav, UA: 1.02
UROBILINOGEN UA: 0.2
pH, UA: 6

## 2015-11-20 MED ORDER — NITROFURANTOIN MONOHYD MACRO 100 MG PO CAPS: 100.0000 mg | ORAL_CAPSULE | Freq: Two times a day (BID) | ORAL | Status: DC

## 2015-11-20 NOTE — Progress Notes (Signed)
HPI:  Hematuria: -started yesterday, intermittent -mild dysuria -no flank pain, fevers, malaise, vaginal or rectal symptoms, nausea, vomiting, abd or pelvic pain, unusual dietary intake, weight loss, smoking hx -hx UTI a few years ago  ROS: See pertinent positives and negatives per HPI.  Past Medical History  Diagnosis Date  . Arthritis     "knees" (05/19/2013)  . WPW (Wolff-Parkinson-White syndrome) ~ 1983    a. s/p ablation 05/20/13.  Marland Kitchen. SVT (supraventricular tachycardia) (HCC)   . Hx of echocardiogram     Echo (12/15):  EF 60-65%, no RWMA, Gr 1 DD    Past Surgical History  Procedure Laterality Date  . Excision morton's neuroma Left 05/04/2013    Procedure: LEFT SECOND MT  WEIL/SECOND WEBB SPACE NEUROMA EXCISION ;  Surgeon: Toni ArthursJohn Hewitt, MD;  Location: [facility] OR;  Service: Orthopedics;  Laterality: Left;  . Supraventricular tachycardia ablation  05/19/2013  . Breast biopsy Left 2000's  . Anal fissure repair  1980's  . Supraventricular tachycardia ablation N/A 05/19/2013    Procedure: SUPRAVENTRICULAR TACHYCARDIA ABLATION;  Surgeon: Marinus MawGregg W Taylor, MD;  [facility];  Service: Cardiovascular;  Laterality: N/A;    Family History  Problem Relation Age of Onset  . Diabetes Mother   . Depression Mother   . Melanoma Mother   . Heart attack Neg Hx     Social History   Social History  . Marital Status: Married    Spouse Name: Loraine LericheMark  . Number of Children: 2  . Years of Education: BS   Occupational History  .      [employer]   Social History Main Topics  . Smoking status: Never Smoker   . Smokeless tobacco: Never Used  . Alcohol Use: 2.4 oz/week    4 Glasses of wine per week     Comment: 1-2 glasses occassionaly  . Drug Use: No  . Sexual Activity: Yes    Birth Control/ Protection: Inserts   Other Topics Concern  . None   Social History Narrative   Patient is married and lives at home with her husband.  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    No regular exercise - diet is ok.        Current outpatient prescriptions:  .  Acetylcarnitine HCl (ACETYL L-CARNITINE PO), Take by mouth., Disp: , Rfl:  .  aspirin 81 MG EC tablet, Take 81 mg by mouth daily., Disp: , Rfl: 0 .  Calcium Carb-Cholecalciferol (CALCIUM + D3 PO), Take 600 mg by mouth 2 (two) times daily., Disp: , Rfl:  .  Cetirizine HCl (ZYRTEC ALLERGY PO), Take 1 tablet by mouth as needed (for allergies)., Disp: , Rfl:  .  diphenhydrAMINE (BENADRYL) 25 MG tablet, Take 12.5-25 mg by mouth at bedtime as needed for sleep., Disp: , Rfl:  .  Misc Natural Products (OSTEO BI-FLEX ADV TRIPLE ST) TABS, Take 1 tablet by mouth daily. , Disp: , Rfl:  .  Multiple Vitamin (MULTIVITAMINS PO), Take by mouth., Disp: , Rfl:  .  NON FORMULARY, Take 1 tablet by mouth at bedtime. NyQuil, Disp: , Rfl:  .  NON FORMULARY, Mid-nite, Disp: , Rfl:  .  vitamin C (ASCORBIC ACID) 500 MG tablet, Take 500 mg by mouth daily., Disp: , Rfl:  .  nitrofurantoin, macrocrystal-monohydrate, (MACROBID) 100 MG capsule, Take 1 capsule (100 mg total) by mouth 2 (two) times daily., Disp: 14 capsule, Rfl: 0  EXAM:  Filed Vitals:   11/20/15 1107  BP: 90/70  Pulse: 80  Temp: 98.2 F (36.8 C)    Body mass index is 22.73 kg/(m^2).  GENERAL: vitals reviewed and listed above, alert, oriented, appears well hydrated and in no acute distress  HEENT: atraumatic, conjunttiva clear, no obvious abnormalities on inspection of external nose and ears  NECK: no obvious masses on inspection  LUNGS: clear to auscultation bilaterally, no wheezes, rales or rhonchi, good air movement  CV: HRRR, no peripheral edema  ABD: soft, NTTP, no CVA TTP  MS: moves all extremities without noticeable abnormality  PSYCH: pleasant and cooperative, no obvious depression or anxiety  ASSESSMENT AND PLAN:  Discussed the following assessment and plan:  Hematuria  Dysuria  -udip c/w likely UTI - opted for empiric  tx -culture and micro pending -Patient advised to return or notify a doctor immediately if symptoms worsen or persist or new concerns arise.  Patient Instructions  Take the antibiotic as instructed. Follow up if any persistent symptoms or concerns.  We have ordered labs or studies at this visit. It can take up to 1-2 weeks for results and processing. We will contact you with instructions IF your results are abnormal. Normal results will be released to your [facility]. If you have not heard from [place] or can not find your results in [facility] in 2 weeks please contact our office.            Kriste Basque R.

## 2015-11-20 NOTE — Patient Instructions (Signed)
Take the antibiotic as instructed. Follow up if any persistent symptoms or concerns.  We have ordered labs or studies at this visit. It can take up to 1-2 weeks for results and processing. We will contact you with instructions IF your results are abnormal. Normal [facility]. If you have not heard from us or can not find your results [facility] in 2 weeks please contact our office.

## 2015-11-20 NOTE — Progress Notes (Signed)
Pre visit review using our clinic review tool, if applicable. No additional management support is needed unless otherwise documented below in the visit note. 

## 2015-11-20 NOTE — Addendum Note (Signed)
Addended by: Johnella MoloneyFUNDERBURK, JO A on: 11/20/2015 11:43 AM   Modules accepted: Orders

## 2015-11-22 LAB — URINE CULTURE
COLONY COUNT: NO GROWTH
ORGANISM ID, BACTERIA: NO GROWTH

## 2015-11-22 NOTE — Addendum Note (Signed)
Addended by: Johnella MoloneyFUNDERBURK, Dyshon Philbin A on: 11/22/2015 09:29 AM   Modules accepted: Orders

## 2016-01-15 ENCOUNTER — Ambulatory Visit (INDEPENDENT_AMBULATORY_CARE_PROVIDER_SITE_OTHER): Payer: Commercial Managed Care - HMO | Admitting: Family Medicine

## 2016-01-15 ENCOUNTER — Encounter: Payer: Self-pay | Admitting: Family Medicine

## 2016-01-15 VITALS — BP 100/72 | HR 70 | Temp 97.5°F | Ht 65.25 in | Wt 136.4 lb

## 2016-01-15 DIAGNOSIS — Z0000 Encounter for general adult medical examination without abnormal findings: Secondary | ICD-10-CM | POA: Diagnosis not present

## 2016-01-15 DIAGNOSIS — N200 Calculus of kidney: Secondary | ICD-10-CM | POA: Diagnosis not present

## 2016-01-15 DIAGNOSIS — J309 Allergic rhinitis, unspecified: Secondary | ICD-10-CM | POA: Diagnosis not present

## 2016-01-15 DIAGNOSIS — R002 Palpitations: Secondary | ICD-10-CM | POA: Diagnosis not present

## 2016-01-15 LAB — LIPID PANEL
CHOL/HDL RATIO: 3
Cholesterol: 160 mg/dL (ref 0–200)
HDL: 63 mg/dL (ref >39.00)
LDL CALC: 80 mg/dL (ref 0–99)
NONHDL: 96.88
TRIGLYCERIDES: 86 mg/dL (ref 0.0–149.0)
VLDL: 17.2 mg/dL (ref 0.0–40.0)

## 2016-01-15 LAB — BASIC METABOLIC PANEL
BUN: 17 mg/dL (ref 6–23)
CALCIUM: 9.8 mg/dL (ref 8.4–10.5)
CHLORIDE: 102 meq/L (ref 96–112)
CO2: 30 meq/L (ref 19–32)
CREATININE: 0.56 mg/dL (ref 0.40–1.20)
GFR: 119.67 mL/min (ref >60.00)
Glucose, Bld: 95 mg/dL (ref 70–99)
Potassium: 4.2 mEq/L (ref 3.5–5.1)
Sodium: 140 mEq/L (ref 135–145)

## 2016-01-15 LAB — HEMOGLOBIN A1C: HEMOGLOBIN A1C: 5.5 % (ref 4.6–6.5)

## 2016-01-15 NOTE — Progress Notes (Signed)
HPI:  Laura Wheeler is a pleasant 55 yo here for her medical physical. She sees Julio Sicks for her gyn exams. She reports mild osteopenia dx by gyn. She has been stressed some related to loss of job and now looking for a new job. She has chronic PND with her allergies. Otherwise is doing well.   Follow up issues:  Hx WPW s/p ablation/Palpitations: -sees cardiologist, stable -no CP, SOB, DOE  Hematuria: -in 10/2015 w/ dysuria -initially thought to be infection, but cx neg, referred to urology -has nonobstructing stone - sees urologist again on Feb 2   -Diet: variety of foods, balance and well rounded - has made significant changes and is very happy with weight loss  -Exercise:regular exercise  -Takingvitamin D/calcium: yes  -Diabetes and Dyslipidemia Screening:FASTING today  -Vaccines: UTD  -pap history: 8/16 with gyn  -sexual activity: yes, female partner, no new partners  -wants STI testing (Hep C if born [birth year]): no -ok with hep c  -FH breast, colon or ovarian ca: see FH Last mammogram: done Last colon cancer screening: done  -Alcohol, Tobacco, drug use: see social history  Review of Systems - no fevers, unintentional weight loss, vision loss, hearing loss, chest pain, sob, hemoptysis, melena, hematochezia, hematuria, genital discharge, changing or concerning skin lesions, bleeding, bruising, loc, thoughts of self harm or SI  Past Medical History  Diagnosis Date  . Arthritis     "knees" (05/19/2013)  . WPW (Wolff-Parkinson-White syndrome) ~ 1983    a. s/p ablation 05/20/13.  Marland Kitchen SVT (supraventricular tachycardia) (HCC)   . Hx of echocardiogram     Echo (12/15):  EF 60-65%, no RWMA, Gr 1 DD    Past Surgical History  Procedure Laterality Date  . Excision morton's neuroma Left 05/04/2013    Procedure: LEFT SECOND MT  WEIL/SECOND WEBB SPACE NEUROMA EXCISION ;  Surgeon: [name], MD;  Location: [facility] OR;  Service: Orthopedics;  Laterality: Left;  .  Supraventricular tachycardia ablation  05/19/2013  . Breast biopsy Left 2000's  . Anal fissure repair  1980's  . Supraventricular tachycardia ablation N/A 05/19/2013    Procedure: SUPRAVENTRICULAR TACHYCARDIA ABLATION;  Surgeon: Marinus Maw, MD;  Location[facility] CATH LAB;  Service: Cardiovascular;  Laterality: N/A;    Family History  Problem Relation Age of Onset  . Diabetes Mother   . Depression Mother   . Melanoma Mother   . Heart attack Neg Hx     Social History   Social History  . Marital Status: Married    Spouse Name: Loraine Leriche  . Number of Children: 2  . Years of Education: BS   Occupational History  .      [employer]   Social History Main Topics  . Smoking status: Never Smoker   . Smokeless tobacco: Never Used  . Alcohol Use: 2.4 oz/week    4 Glasses of wine per week     Comment: 1-2 glasses occassionaly  . Drug Use: No  . Sexual Activity: Yes    Birth Control/ Protection: Inserts   Other Topics Concern  . None   Social History Narrative   Patient is married and lives at home with her husband.  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    No regular exercise - diet is ok.        Current outpatient prescriptions:  .  Acetylcarnitine HCl (ACETYL L-CARNITINE PO), Take by mouth., Disp: , Rfl:  .  aspirin 81 MG EC tablet,  Take 81 mg by mouth daily., Disp: , Rfl: 0 .  Calcium Carb-Cholecalciferol (CALCIUM + D3 PO), Take 600 mg by mouth 2 (two) times daily., Disp: , Rfl:  .  Cetirizine HCl (ZYRTEC ALLERGY PO), Take 1 tablet by mouth as needed (for allergies)., Disp: , Rfl:  .  diphenhydrAMINE (BENADRYL) 25 MG tablet, Take 12.5-25 mg by mouth at bedtime as needed for sleep., Disp: , Rfl:  .  Misc Natural Products (OSTEO BI-FLEX ADV TRIPLE ST) TABS, Take 1 tablet by mouth daily. , Disp: , Rfl:  .  Multiple Vitamin (MULTIVITAMINS PO), Take by mouth., Disp: , Rfl:  .  NON FORMULARY, Take 1 tablet by mouth at bedtime. NyQuil, Disp: , Rfl:  .   vitamin C (ASCORBIC ACID) 500 MG tablet, Take 500 mg by mouth daily., Disp: , Rfl:   EXAM:  Filed Vitals:   01/15/16 1007  BP: 100/72  Pulse: 70  Temp: 97.5 F (36.4 C)    GENERAL: vitals reviewed and listed below, alert, oriented, appears well hydrated and in no acute distress  HEENT: head atraumatic, PERRLA, normal appearance of eyes, ears, nose and mouth. moist mucus membranes.  NECK: supple, no masses or lymphadenopathy  LUNGS: clear to auscultation bilaterally, no rales, rhonchi or wheeze  CV: HRRR, no peripheral edema or cyanosis, normal pedal pulses  BREAST: declined  ABDOMEN: bowel sounds normal, soft, non tender to palpation, no masses, no rebound or guarding  GU: declined, sees gyn  SKIN: no rash or abnormal lesions  MS: normal gait, moves all extremities normally  NEURO: CN II-XII grossly intact, normal muscle strength and sensation to light touch on extremities  PSYCH: normal affect, pleasant and cooperative  ASSESSMENT AND PLAN:  Discussed the following assessment and plan:  Visit for preventive health examination - Plan: Basic metabolic panel, Lipid Panel, Hemoglobin A1c, Hep C Antibody  Palpitations  Allergic rhinitis, unspecified allergic rhinitis type  Nephrolithiasis - seeing urologist  -Discussed and advised all [place] preventive services health task force level A and B recommendations for age, sex and risks.  -Advised at least 150 minutes of exercise per week and a healthy diet low in saturated fats and sweets and consisting of fresh fruits and vegetables, lean meats such as fish and white chicken and whole grains.  -FASTING labs, studies and vaccines per orders this encounter  Orders Placed This Encounter  Procedures  . Basic metabolic panel  . Lipid Panel  . Hemoglobin A1c  . Hep C Antibody    Patient advised to return to clinic immediately if symptoms worsen or persist or new concerns.  Patient Instructions  BEFORE YOU  LEAVE: -labs -follow up yearly and as needed  Try flonase 2 sprays each nostril daily for 21 days, then 1 spray each nostril daily for the drainage and clearing throat. Follow up if symptoms persist or concerns.  Consider seeing Dr. Jason Fila to assist in stress with your new job  -We have ordered labs or studies at this visit. It can take up to 1-2 weeks for results and processing. We will contact you with instructions IF your results are abnormal. Normal results will be released to your [facility]. If you have not heard from [place] or can not find your results in [facility] in 2 weeks please contact our office.  We recommend the following healthy lifestyle measures: - eat a healthy whole foods diet consisting of regular small meals composed of vegetables, fruits, beans, nuts, seeds, healthy meats such as white chicken and  fish and whole grains.  - avoid sweets, white starchy foods, fried foods, fast food, processed foods, sodas, red meet and other fattening foods.  - get a least 150-300 minutes of aerobic exercise per week.   Vit D3 1000 IU daily ([employer] brand); stop multivitamins and calcium - ensure adequate dietary intake of calcium ( ) and take extra calcium only if needed to obtain  daily         No Follow-up on file.  Kriste Basque R.

## 2016-01-15 NOTE — Patient Instructions (Signed)
BEFORE YOU LEAVE: -labs -follow up yearly and as needed  Try flonase 2 sprays each nostril daily for 21 days, then 1 spray each nostril daily for the drainage and clearing throat. Follow up if symptoms persist or concerns.  Consider seeing Dr. Jason Fila to assist in stress with your new job  -We have ordered labs or studies at this visit. It can take up to 1-2 weeks for results and processing. We will contact you with instructions IF your results are abnormal. Normal results will be released to your [facility]. If you have not heard from [place] or can not find your results in [facility] in 2 weeks please contact our office.  We recommend the following healthy lifestyle measures: - eat a healthy whole foods diet consisting of regular small meals composed of vegetables, fruits, beans, nuts, seeds, healthy meats such as white chicken and fish and whole grains.  - avoid sweets, white starchy foods, fried foods, fast food, processed foods, sodas, red meet and other fattening foods.  - get a least 150-300 minutes of aerobic exercise per week.   Vit D3 1000 IU daily ([employer] brand); stop multivitamins and calcium - ensure adequate dietary intake of calcium ( ) and take extra calcium only if needed to obtain  daily

## 2016-01-15 NOTE — Progress Notes (Signed)
Pre visit review using our clinic review tool, if applicable. No additional management support is needed unless otherwise documented below in the visit note. 

## 2016-01-16 LAB — HEPATITIS C ANTIBODY: HCV Ab: NEGATIVE

## 2016-01-24 ENCOUNTER — Other Ambulatory Visit: Payer: Self-pay | Admitting: Urology

## 2016-01-28 ENCOUNTER — Ambulatory Visit (HOSPITAL_COMMUNITY)
Admission: RE | Admit: 2016-01-28 | Discharge: 2016-01-28 | Disposition: A | Discharge status: Home / Self Care | Payer: Commercial Managed Care - HMO | Source: Ambulatory Visit | Attending: Urology | Admitting: Urology

## 2016-01-28 ENCOUNTER — Inpatient Hospital Stay (HOSPITAL_COMMUNITY): Admission: RE | Admit: 2016-01-28 | Payer: Commercial Managed Care - HMO | Source: Ambulatory Visit

## 2016-01-28 ENCOUNTER — Other Ambulatory Visit: Payer: Self-pay

## 2016-01-28 ENCOUNTER — Encounter (HOSPITAL_COMMUNITY): Payer: Self-pay | Admitting: *Deleted

## 2016-01-28 DIAGNOSIS — Z01818 Encounter for other preprocedural examination: Secondary | ICD-10-CM | POA: Diagnosis not present

## 2016-01-28 NOTE — Progress Notes (Signed)
Spoke to [name] at [facility] center with regards to Mrs. Laura Wheeler's  Cardiac history.  Per Steward Drone a recent EKG will be required prior to her Lithotripsy procedure.  Pt will come in today for EKG.

## 2016-01-29 NOTE — H&P (Signed)
Reason For Visit Management strategy for nonobstructing nephrolithiasis   History of Present Illness 55 year old female, patient of Dr. Lorin Picket MacDiarmid, whom he was following for nephrolithiasis. She presents today as a second opinion for management of a right mid ureteral stone. The patient was found to have this stone because of gross hematuria. Her workup was otherwise negative. She was noted to have a 75 mm mid ureteral stone. She was not having any associated renal colic or voiding symptoms. Her hematuria has resolved. She denies any dysuria. She denies any fevers or chills. She presented today with a KUB prior.     The patient has a history of Wolff-Parkinson-White syndrome and had a cardiac ablation several years ago. She is asymptomatic at this point and only takes a small baby aspirin.   Past Medical History Problems  1. History of arthritis (Z87.39) 2. History of atrial fibrillation (Z86.79) 3. History of kidney stones (Z87.442) 4. History of Murmur (R01.1) 5. History of Wolff-Parkinson-White syndrome (I45.6)  Surgical History Problems  1. History of Catheter Ablation 2. History of Cornea Excimer Laser Photorefractive Keratectomy Bilaterally 3. History of Foot Surgery  Current Meds 1. Aspirin 81 MG TABS;  Therapy: (Recorded:13Dec2016) to Recorded 2. Calcium 600 MG Oral Tablet;  Therapy: (Recorded:13Dec2016) to Recorded 3. Multi-Vitamin TABS;  Therapy: (Recorded:13Dec2016) to Recorded 4. NuvaRing RING;  Therapy: (Recorded:15Oct2010) to Recorded 5. Vitamin C TABS;  Therapy: (Recorded:13Dec2016) to Recorded  Allergies Medication  1. Sulfa Drugs  Family History Problems  1. Family history of Family Health Status - Father's Age   1 2. Family history of Family Health Status - Mother's Age   56 3. Family history of Family Health Status Number Of Children   2 sons 4. Family history of diabetes mellitus (Z83.3) : Mother 5. History of back surgery : Mother 6.  Family history of Nephrolithiasis : Father  Social History Problems  1. Alcohol Use (History)   One a week 2. Caffeine Use   1 a day 3. Marital History - Currently Married 4. Never a smoker 5. Occupation:   Emergency planning/management officer 6. Denied: History of Tobacco Use  Review of Systems No changes in pts bowel habits, neurological changes, or progressive lower urinary tract symptoms.      Vitals Vital Signs [Data Includes: Last 1 Day]  Recorded: 02Feb2017 04:02PM  Blood Pressure: 96 / 62 Heart Rate: 71  Physical Exam No acute distress  Heart sounds are normal, no murmur appreciated, regular rate and rhythm  Lungs sounds are clear to auscultation bilaterally on the posterior lower lobe  No CVA tenderness  Abdomen soft and nontender   Results/Data Urine [Data Includes: Last 1 Day]   02Feb2017  COLOR YELLOW   APPEARANCE CLEAR   SPECIFIC GRAVITY 1.020   pH 6.0   GLUCOSE NEGATIVE   BILIRUBIN NEGATIVE   KETONE TRACE   BLOOD 2+   PROTEIN NEGATIVE   NITRITE NEGATIVE   LEUKOCYTE ESTERASE NEGATIVE   SQUAMOUS EPITHELIAL/HPF NONE SEEN HPF  WBC 0-5 WBC/HPF  RBC 3-10 RBC/HPF  BACTERIA NONE SEEN HPF  CRYSTALS NONE SEEN HPF  CASTS NONE SEEN LPF  Yeast NONE SEEN HPF   Urinalysis demonstrates microscopic hematuria without evidence of infection  KUB obtained in clinic today to evaluate stone location: Renal shadows are easily visualized bilaterally. There are no stones in the expected location of the fact collecting systems bilaterally. There is a calcification overlying the sacroiliac joint on the right consistent with the patient's known stone which is unchanged in  position from her initial CT scan almost 4 weeks prior. Her gas pattern is grossly normal. The structures are also grossly normal.   Plan Health Maintenance  1. UA With REFLEX; [Do Not Release]; Status:Complete;   Done: 02Feb2017 03:34PM Nephrolithiasis  2. Start: Hydrocodone-Acetaminophen 5-325 MG Oral Tablet;  TAKE 1 TABLET EVERY 4 TO  6 HOURS AS NEEDED 3. Start: Tamsulosin HCl - 0.4 MG Oral Capsule; TAKE 1 CAPSULE Daily 4. URINE CULTURE; Status:In Progress - Specimen/Data Collected;   Done: 02Feb2017  Discussion/Summary The patient has an asymptomatic right mid ureteral stone. The patient is currently in the process of seeking employment. As such, she is not willing to try medical expulsion therapy time fearing that if it will interfere w/ her new job if problems symptomatic or requires treatment down the road. As such, she would like to take care of this sooner rather than later. We discussed treatment options including ureteroscopy and shockwave lithotripsy. Ultimately, the patient has opted for shockwave lithotripsy I will this procedure in detail. She understands that in the mid/distal ureter success rate can go down and is on 70% from this position. She understands that she may well need additional procedures or have associated pain from obstructing fragments. She also understands the risks of hematoma. I have provided the patient a prescription for tamsulosin as well as Vicodin. We'll get her shockwave lithotripsy scheduled in the near future.   Signatures Electronically signed by : Berniece Salines, M.D.; Jan 23 2016  7:03PM EST

## 2016-01-30 ENCOUNTER — Encounter (HOSPITAL_COMMUNITY)
Admission: RE | Disposition: A | Discharge status: Home / Self Care | Payer: Self-pay | Source: Ambulatory Visit | Attending: Urology

## 2016-01-30 ENCOUNTER — Encounter (HOSPITAL_COMMUNITY): Payer: Self-pay | Admitting: General Practice

## 2016-01-30 ENCOUNTER — Ambulatory Visit (HOSPITAL_COMMUNITY)
Admission: RE | Admit: 2016-01-30 | Discharge: 2016-01-30 | Disposition: A | Discharge status: Home / Self Care | Payer: Commercial Managed Care - HMO | Source: Ambulatory Visit | Attending: Urology | Admitting: Urology

## 2016-01-30 ENCOUNTER — Ambulatory Visit (HOSPITAL_COMMUNITY): Payer: Commercial Managed Care - HMO

## 2016-01-30 DIAGNOSIS — Z87442 Personal history of urinary calculi: Secondary | ICD-10-CM | POA: Insufficient documentation

## 2016-01-30 DIAGNOSIS — M1990 Unspecified osteoarthritis, unspecified site: Secondary | ICD-10-CM | POA: Diagnosis not present

## 2016-01-30 DIAGNOSIS — I4891 Unspecified atrial fibrillation: Secondary | ICD-10-CM | POA: Diagnosis not present

## 2016-01-30 DIAGNOSIS — N201 Calculus of ureter: Secondary | ICD-10-CM | POA: Diagnosis not present

## 2016-01-30 DIAGNOSIS — Z7982 Long term (current) use of aspirin: Secondary | ICD-10-CM | POA: Insufficient documentation

## 2016-01-30 DIAGNOSIS — Z79899 Other long term (current) drug therapy: Secondary | ICD-10-CM | POA: Insufficient documentation

## 2016-01-30 HISTORY — DX: Other specified postprocedural states: Z98.890

## 2016-01-30 SURGERY — LITHOTRIPSY, ESWL
Anesthesia: LOCAL | Laterality: Right

## 2016-01-30 MED ORDER — DIAZEPAM 5 MG PO TABS
10.0000 mg | ORAL_TABLET | ORAL | Status: AC
  Administered 2016-01-30: 10 mg via ORAL
  Filled 2016-01-30: qty 2

## 2016-01-30 MED ORDER — SODIUM CHLORIDE 0.9 % IV SOLN
INTRAVENOUS | Status: DC
  Administered 2016-01-30: 07:00:00 via INTRAVENOUS

## 2016-01-30 MED ORDER — DIPHENHYDRAMINE HCL 25 MG PO CAPS
25.0000 mg | ORAL_CAPSULE | ORAL | Status: AC
  Administered 2016-01-30: 25 mg via ORAL
  Filled 2016-01-30: qty 1

## 2016-01-30 MED ORDER — CIPROFLOXACIN HCL 500 MG PO TABS
500.0000 mg | ORAL_TABLET | ORAL | Status: AC
  Administered 2016-01-30: 500 mg via ORAL
  Filled 2016-01-30: qty 1

## 2016-01-30 NOTE — Interval H&P Note (Signed)
History and Physical Interval Note:  01/30/2016 7:36 AM  Laura Wheeler  has presented today for surgery, with the diagnosis of RIGHT MID URETERAL STONE  The various methods of treatment have been discussed with the patient and family. After consideration of risks, benefits and other options for treatment, the patient has consented to  Procedure(s): RIGHT EXTRACORPOREAL SHOCK WAVE LITHOTRIPSY (ESWL) (Right) as a surgical intervention .  The patient's history has been reviewed, patient examined, no change in status, stable for surgery.  I have reviewed the patient's chart and labs.  Questions were answered to the patient's satisfaction.     Elian Gloster I [name]

## 2016-01-30 NOTE — Discharge Instructions (Signed)
Kidney Stones °Kidney stones (urolithiasis) are deposits that form inside your kidneys. The intense pain is caused by the stone moving through the urinary tract. When the stone moves, the ureter goes into spasm around the stone. The stone is usually passed in the urine.  °CAUSES  °· A disorder that makes certain neck glands produce too much parathyroid hormone (primary hyperparathyroidism). °· A buildup of uric acid crystals, similar to gout in your joints. °· Narrowing (stricture) of the ureter. °· A kidney obstruction present at birth (congenital obstruction). °· Previous surgery on the kidney or ureters. °· Numerous kidney infections. °SYMPTOMS  °· Feeling sick to your stomach (nauseous). °· Throwing up (vomiting). °· Blood in the urine (hematuria). °· Pain that usually spreads (radiates) to the groin. °· Frequency or urgency of urination. °DIAGNOSIS  °· Taking a history and physical exam. °· Blood or urine tests. °· CT scan. °· Occasionally, an examination of the inside of the urinary bladder (cystoscopy) is performed. °TREATMENT  °· Observation. °· Increasing your fluid intake. °· Extracorporeal shock wave lithotripsy--This is a noninvasive procedure that uses shock waves to break up kidney stones. °· Surgery may be needed if you have severe pain or persistent obstruction. There are various surgical procedures. Most of the procedures are performed with the use of small instruments. Only small incisions are needed to accommodate these instruments, so recovery time is minimized. °The size, location, and chemical composition are all important variables that will determine the proper choice of action for you. Talk to your health care provider to better understand your situation so that you will minimize the risk of injury to yourself and your kidney.  °HOME CARE INSTRUCTIONS  °· Drink enough water and fluids to keep your urine clear or pale yellow. This will help you to pass the stone or stone fragments. °· Strain  all urine through the provided strainer. Keep all particulate matter and stones for your health care provider to see. The stone causing the pain may be as small as a grain of salt. It is very important to use the strainer each and every time you pass your urine. The collection of your stone will allow your health care provider to analyze it and verify that a stone has actually passed. The stone analysis will often identify what you can do to reduce the incidence of recurrences. °· Only take over-the-counter or prescription medicines for pain, discomfort, or fever as directed by your health care provider. °· Keep all follow-up visits as told by your health care provider. This is important. °· Get follow-up X-rays if required. The absence of pain does not always mean that the stone has passed. It may have only stopped moving. If the urine remains completely obstructed, it can cause loss of kidney function or even complete destruction of the kidney. It is your responsibility to make sure X-rays and follow-ups are completed. Ultrasounds of the kidney can show blockages and the status of the kidney. Ultrasounds are not associated with any radiation and can be performed easily in a matter of minutes. °· Make changes to your daily diet as told by your health care provider. You may be told to: °¨ Limit the amount of salt that you eat. °¨ Eat 5 or more servings of fruits and vegetables each day. °¨ Limit the amount of meat, poultry, fish, and eggs that you eat. °· Collect a 24-hour urine sample as told by your health care provider. You may need to collect another urine sample every 6-12   months. SEEK MEDICAL CARE IF:  You experience pain that is progressive and unresponsive to any pain medicine you have been prescribed. SEEK IMMEDIATE MEDICAL CARE IF:   Pain cannot be controlled with the prescribed medicine.  You have a fever or shaking chills.  The severity or intensity of pain increases over 18 hours and is not  relieved by pain medicine.  You develop a new onset of abdominal pain.  You feel faint or pass out.  You are unable to urinate.   This information is not intended to replace advice given to you by your health care provider. Make sure you discuss any questions you have with your health care provider.   Document Released: 12/07/2005 Document Revised: 08/28/2015 Document Reviewed: 05/10/2013 Elsevier Interactive Patient Education 2016 [employer]. Note: Pt is allergic to Sulfa ( remote history). Potential drug interaction  ( cross reaction) with  Tamsulosin. However, pt. Tas taken Tamsulosin to date without any side effect.

## 2016-04-28 ENCOUNTER — Encounter: Payer: Self-pay | Admitting: Family Medicine

## 2016-04-28 ENCOUNTER — Ambulatory Visit (INDEPENDENT_AMBULATORY_CARE_PROVIDER_SITE_OTHER): Payer: Commercial Managed Care - HMO | Admitting: Family Medicine

## 2016-04-28 VITALS — BP 92/60 | HR 76 | Temp 97.8°F | Ht 66.0 in | Wt 141.5 lb

## 2016-04-28 DIAGNOSIS — M19049 Primary osteoarthritis, unspecified hand: Secondary | ICD-10-CM

## 2016-04-28 DIAGNOSIS — M222X9 Patellofemoral disorders, unspecified knee: Secondary | ICD-10-CM

## 2016-04-28 DIAGNOSIS — M79673 Pain in unspecified foot: Secondary | ICD-10-CM | POA: Diagnosis not present

## 2016-04-28 NOTE — Progress Notes (Signed)
HPI:  Laura Wheeler is a pleasant 55 year old, somewhat anxious patient, here for an acute visit for several aches and pains. She recently was traveling in caring suitcases and noticed that her right thumb and right pinky distal IP jts were achy. She does a lot of typing in her new job. No redness, weakness or numbness. She has chronic foot issues after surgery and wears orthotics. She sometimes notices some pain at the medial edge of the right heel, thinks this is related to how she walks. She has chronic knee issues and has been evaluated for this in the past and has some OA of the knees. Her knees have been better recently, but she has chronic crepitus under the kneecap. She wonders about treatment options for this. Does not have redness or swelling of the joints, malaise or fevers, weakness or numbness.  ROS: See pertinent positives and negatives per HPI.  Past Medical History  Diagnosis Date  . Arthritis     "knees" (05/19/2013)  . WPW (Wolff-Parkinson-White syndrome) ~ 1983    a. s/p ablation 05/20/13.  Marland Kitchen SVT (supraventricular tachycardia) (H[facility])   . Hx of echocardiogram     Echo (12/15):  EF 60-65%, no RWMA, Gr 1 DD  . History of cardiac radiofrequency ablation 04/2013    Past Surgical History  Procedure Laterality Date  . Excision morton's neuroma Left 05/04/2013    Procedure: LEFT SECOND MT  WEIL/SECOND WEBB SPACE NEUROMA EXCISION ;  Surgeon: [name], MD;  Location: [facility] OR;  Service: Orthopedics;  Laterality: Left;  . Supraventricular tachycardia ablation  05/19/2013  . Breast biopsy Left 2000's  . Anal fissure repair  1980's  . Supraventricular tachycardia ablation N/A 05/19/2013    Procedure: SUPRAVENTRICULAR TACHYCARDIA ABLATION;  Surgeon: Marinus Maw, MD;  Location[facility] C[facility] LAB;  Service: Cardiovascular;  Laterality: N/A;  . Foot surgery Left 09/2014    hardware removal    Family History  Problem Relation Age of Onset  . Diabetes Mother   . Depression Mother   .  Melanoma Mother   . Heart attack Neg Hx     Social History   Social History  . Marital Status: Married    Spouse Name: Loraine Leriche  . Number of Children: 2  . Years of Education: BS   Occupational History  .      [employer]   Social History Main Topics  . Smoking status: Never Smoker   . Smokeless tobacco: Never Used  . Alcohol Use: 2.4 oz/week    4 Glasses of wine per week     Comment: 1-2 glasses occassionaly  . Drug Use: No  . Sexual Activity: Yes    Birth Control/ Protection: Inserts   Other Topics Concern  . None   Social History Narrative   Patient is married and lives at home with her husband.     New job in 2017, [employer]   No regular exercise - diet is ok.        Current outpatient prescriptions:  .  Acetylcarnitine HCl (ACETYL L-CARNITINE PO), Take by mouth., Disp: , Rfl:  .  Calcium Carb-Cholecalciferol (CALCIUM + D3 PO), Take 600 mg by mouth 2 (two) times daily., Disp: , Rfl:  .  Cetirizine HCl (ZYRTEC ALLERGY PO), Take 1 tablet by mouth as needed (for allergies)., Disp: , Rfl:  .  diphenhydrAMINE (BENADRYL) 25 MG tablet, Take 12.5-25 mg by mouth at bedtime as needed for sleep., Disp: , Rfl:  .  HYDROcodone-acetaminophen (NORCO/VICODIN) 5-325 MG tablet, Take 1 tablet by mouth every 6 (six) hours as needed for moderate pain., Disp: , Rfl:  .  Misc Natural Products (OSTEO BI-FLEX ADV TRIPLE ST) TABS, Take 1 tablet by mouth daily. , Disp: , Rfl:  .  Multiple Vitamin (MULTIVITAMINS PO), Take by mouth., Disp: , Rfl:  .  NON FORMULARY, Take 1 tablet by mouth at bedtime. NyQuil, Disp: , Rfl:  .  vitamin C (ASCORBIC ACID) 500 MG tablet, Take 500 mg by mouth daily., Disp: , Rfl:   EXAM:  Filed Vitals:   04/28/16 0919  BP: 92/60  Pulse: 76  Temp: 97.8 F (36.6 C)    Body mass index is 22.85 kg/(m^2).  GENERAL: vitals reviewed and listed above, alert, oriented, appears well hydrated and in no acute distress  HEENT: atraumatic, conjunttiva clear,  no obvious abnormalities on inspection of external nose and ears  NECK: no obvious masses on inspectionedema  MS: moves all extremities without noticeable abnormality, normal gait, these are stable with negative Lockman and drawer testing and/or without effusion on gross exam, she does have positive patellar crepitus on both sides and j sign on the left knee; normal inspection of the hands other than very mild Heberden's nodes - no redness, swelling or contractures; she is not tender in the plantar fascia of her feet, the area of concern is the bony edge of her heel and this is a pressure point when she stands;there is no significant callus or abnormality on exam.   PSYCH: pleasant and cooperative, no obvious depression or anxiety  ASSESSMENT AND PLAN:  Discussed the following assessment and plan: > 25 minutes spent in caring for this pt with > 50% spent face to face in counseling:  Osteoarthritis of hand, unspecified laterality, unspecified osteoarthritis type -we discussed possible serious and likely etiologies, workup and treatment, treatment risks and return precautions -Suspect very mild OA of the hand, very mild findings on exam -Opted for topical sports creams, and Tylenol as needed for pain and follow up if worsening or new symptoms  PFS (patellofemoral syndrome), unspecified laterality -Home exercise program, follow up as needed   Foot pain, unspecified laterality -Abnormality on exam, new joints involved, suspect pressure point and change in footwear may help; follow-up as needed   -Patient advised to return or notify a doctor immediately if symptoms worsen or persist or new concerns arise.  Patient Instructions  Before you leave: -Patellofemoral syndrome exercises  For aches and pains in the fingers - can use topical sports creams with Cessation or menthol or Tylenol [phone] mg up to 3 times a day as needed. Tumeric in the diet can sometimes help with arthritis.  Do the  exercises for the knees 3 days per week.  Consider a gait analysis at off and running/fleet feet and a change in footwear.  Follow-up as needed if symptoms worsen or persist or new concerns.     Kriste BasqueKIM, Gwendlyn Hanback R.

## 2016-04-28 NOTE — Patient Instructions (Addendum)
Before you leave: -Patellofemoral syndrome exercises  For aches and pains in the fingers - can use topical sports creams with Cessation or menthol or Tylenol [phone] mg up to 3 times a day as needed. Tumeric in the diet can sometimes help with arthritis.  Do the exercises for the knees 3 days per week.  Consider a gait analysis at off and running/fleet feet and a change in footwear.  Follow-up as needed if symptoms worsen or persist or new concerns.

## 2016-04-28 NOTE — Progress Notes (Signed)
Pre visit review using our clinic review tool, if applicable. No additional management support is needed unless otherwise documented below in the visit note. 

## 2016-05-08 ENCOUNTER — Other Ambulatory Visit: Payer: Self-pay | Admitting: Obstetrics and Gynecology

## 2016-05-08 DIAGNOSIS — R921 Mammographic calcification found on diagnostic imaging of breast: Secondary | ICD-10-CM

## 2016-06-29 ENCOUNTER — Ambulatory Visit
Admission: RE | Admit: 2016-06-29 | Discharge: 2016-06-29 | Disposition: A | Discharge status: Home / Self Care | Payer: Commercial Managed Care - HMO | Source: Ambulatory Visit | Attending: Obstetrics and Gynecology | Admitting: Obstetrics and Gynecology

## 2016-06-29 DIAGNOSIS — R921 Mammographic calcification found on diagnostic imaging of breast: Secondary | ICD-10-CM

## 2016-08-04 ENCOUNTER — Telehealth: Payer: Self-pay | Admitting: Family Medicine

## 2016-08-04 NOTE — Telephone Encounter (Signed)
Pt would like dr kim to accept her husband as new pt. Can I sch?

## 2016-08-06 ENCOUNTER — Telehealth: Payer: Self-pay | Admitting: Family Medicine

## 2016-08-06 NOTE — Telephone Encounter (Signed)
error 

## 2016-08-06 NOTE — Telephone Encounter (Signed)
Ok to schedule. Please make her aware of my controlled med policy. Thanks!

## 2016-08-06 NOTE — Telephone Encounter (Signed)
Pt has been scheduled.  °

## 2016-12-05 IMAGING — MG MM DIGITAL DIAGNOSTIC BILAT
6 series · 6 of 6 positions shown · non-contrast
Comparison: 01/04/2015 and earlier

CLINICAL DATA: One year follow-up for left breast calcifications.

EXAM:
DIGITAL DIAGNOSTIC BILATERAL MAMMOGRAM WITH CAD

[L ML]
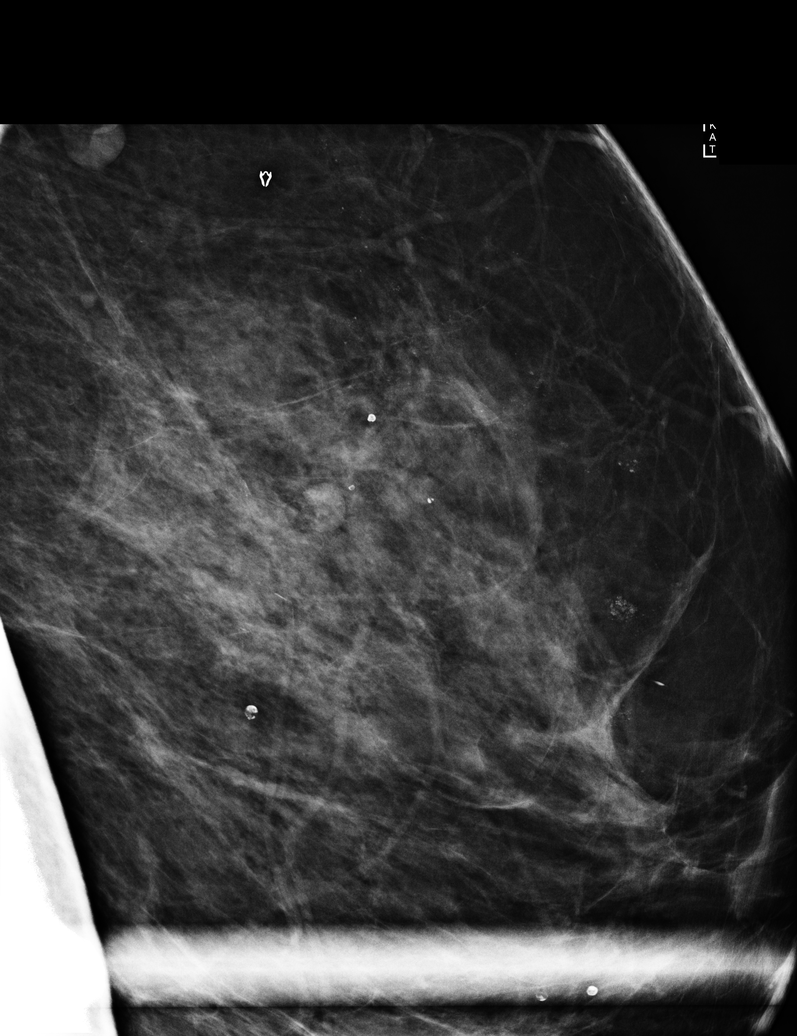

[L CC (1 of 2)]
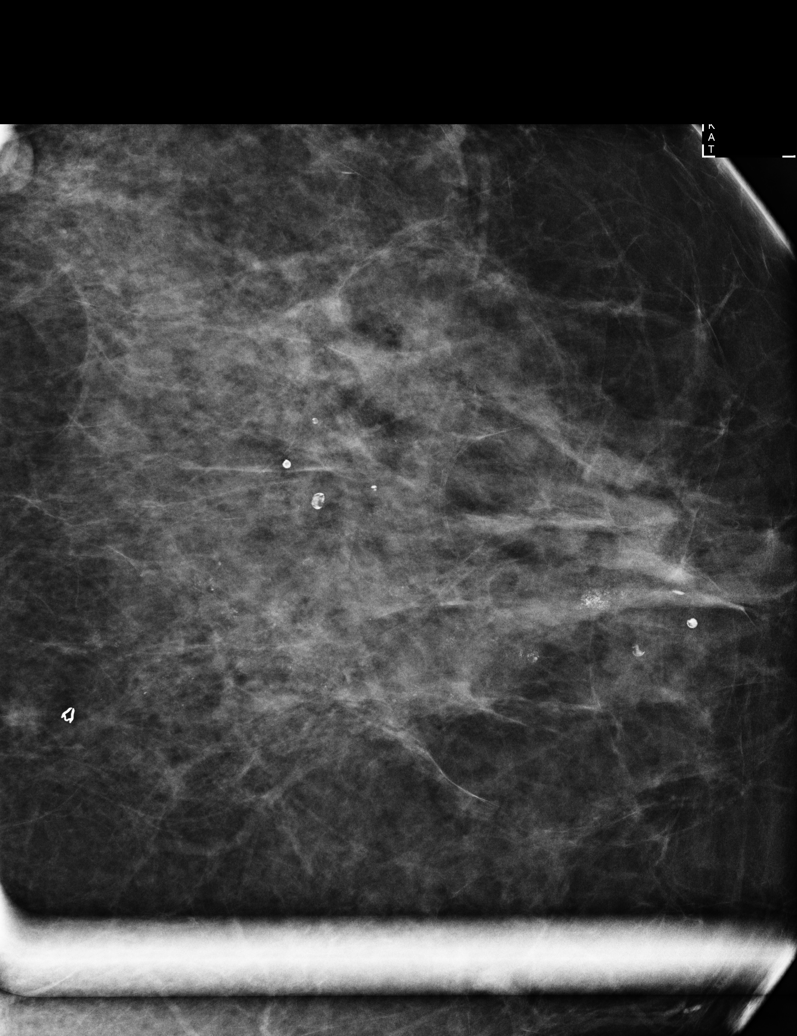

[L MLO]
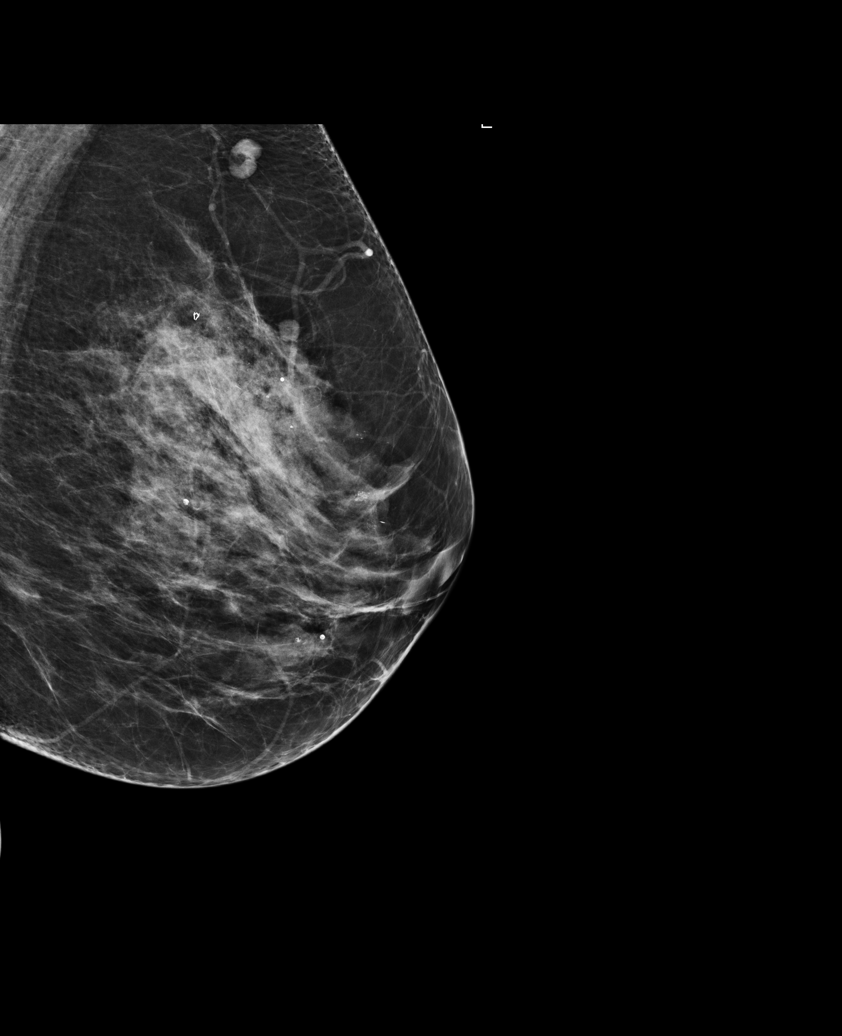

[R MLO]
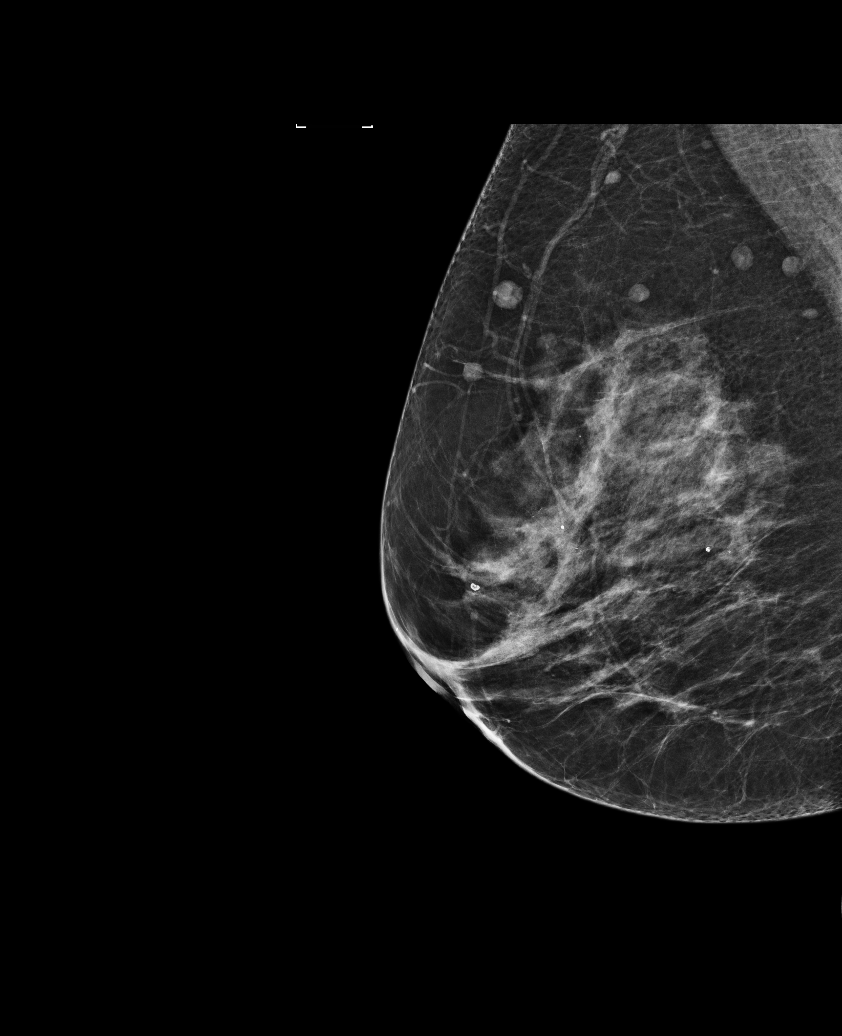

[R CC]
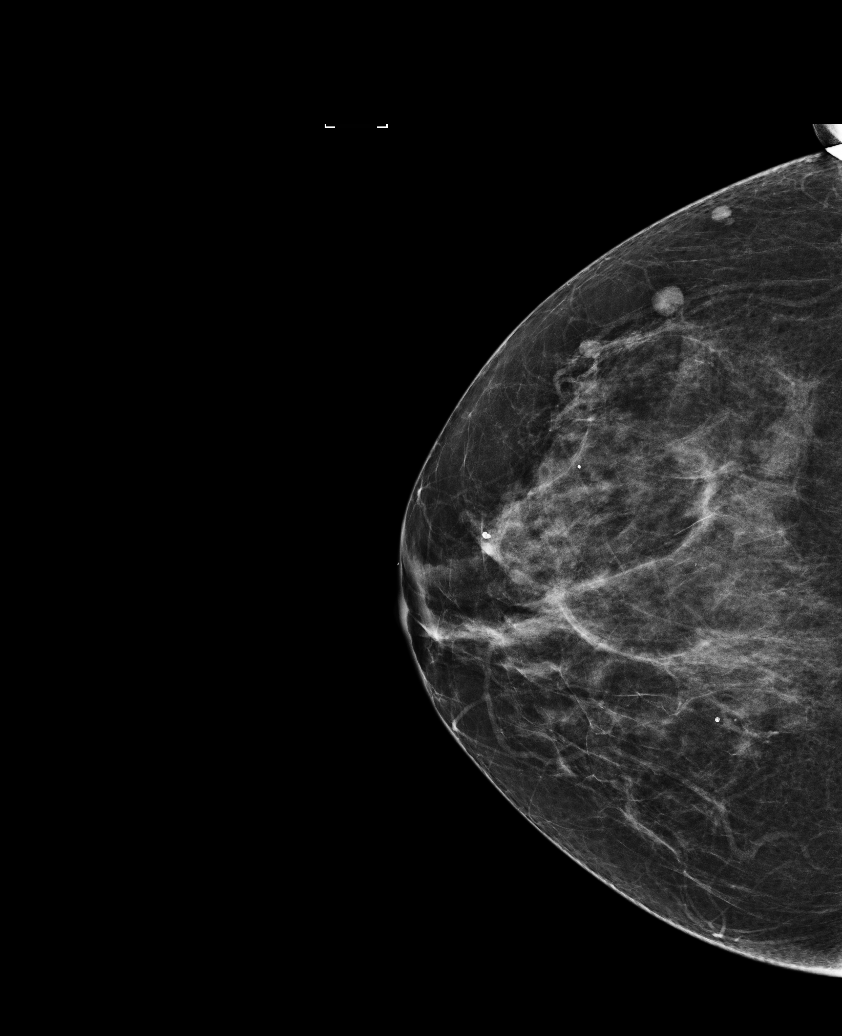

[L CC (2 of 2)]
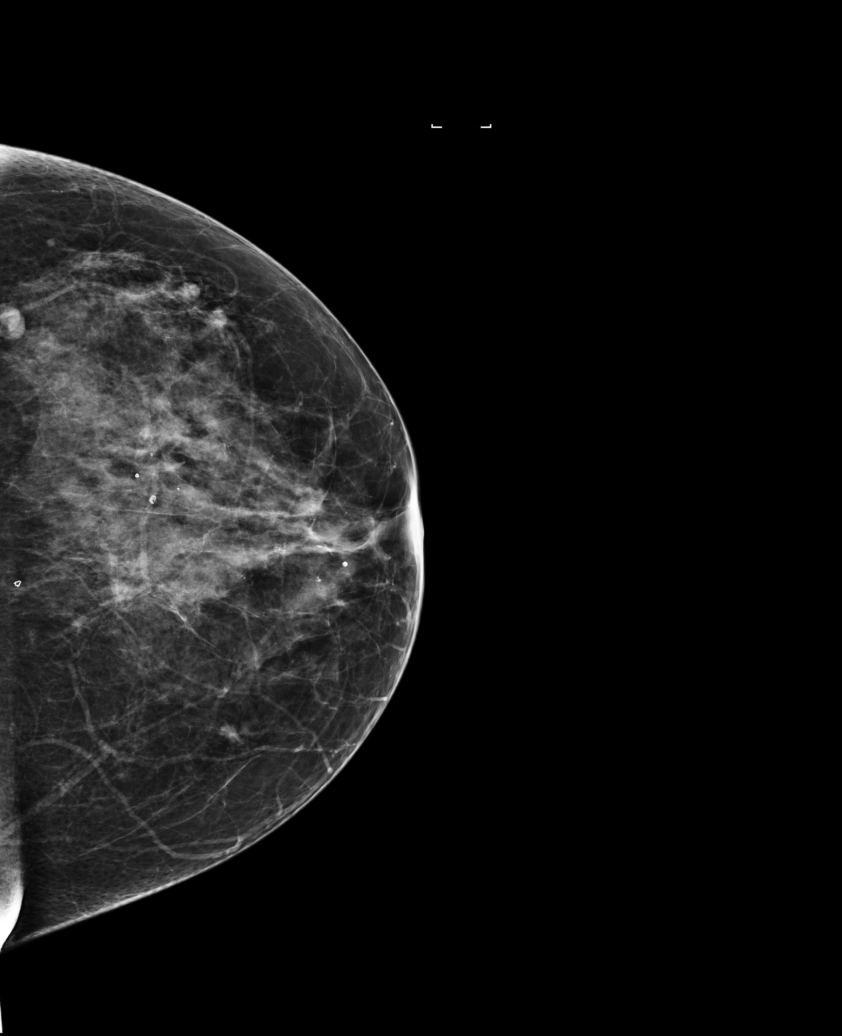

[6 of 6 positions shown; findings below may reference images not displayed]

ACR Breast Density Category c: The breast tissue is heterogeneously
dense, which may obscure small masses.
FINDINGS: There scattered calcifications throughout the upper left breast
further evaluated with magnified views. On magnified views many of
the calcification show layering consistent with milk of calcium.
Calcifications appear stable without suspicious morphology or
distribution. Previous biopsy in 1773 showed fibrocystic changes.
Elsewhere, no suspicious findings identified in either breast.

Mammographic images were processed with CAD.
IMPRESSION: 1. Stable, benign-appearing left breast calcifications.
2. Continued followup is recommended to document 2 years of
stability.
3.  No mammographic evidence for malignancy.

RECOMMENDATION:
Bilateral diagnostic mammogram is recommended in 1 year.

I have discussed the findings and recommendations with the patient.
Results were also provided in writing at the conclusion of the
visit. If applicable, a reminder letter will be sent to the patient
regarding the next appointment.

BI-RADS CATEGORY  3: Probably benign.

One year followup for left breast calcifications.

## 2016-12-22 DIAGNOSIS — G8929 Other chronic pain: Secondary | ICD-10-CM | POA: Diagnosis not present

## 2016-12-22 DIAGNOSIS — M25562 Pain in left knee: Secondary | ICD-10-CM | POA: Diagnosis not present

## 2016-12-22 DIAGNOSIS — M25561 Pain in right knee: Secondary | ICD-10-CM | POA: Diagnosis not present

## 2017-02-18 NOTE — Progress Notes (Signed)
HPI:  Here for CPE  Sees Julio Sicksarol Curtis in GYN for her gynecological physicals. Sees dermatologist for skin exams, Dr. Mertha BaarsLuptone Past medical history of Wolff-Parkinson-White (status post ablation sees cardiologist for this) and nephrolithiasis (urologist). Saw ortho for knee pain and foot pain - did PT and used a topical cream and now she is doing quite well. Has been giving blood on a regular basis.  -Diet: variety of foods, balance and well rounded  -Exercise:  regular exercise  -Taking folic acid, vitamin D or calcium: Taking calcium and vitamin D  -Diabetes and Dyslipidemia Screening: Fasting for labs  -Vaccines: UTD  -pap history: Does with GYN, reports up-to-date  -sexual activity: yes, female partner, no new partners  -wants STI testing (Hep C if born [id number]): no  -FH breast, colon or ovarian ca: see FH Last mammogram:06/2016, BI-RADS 2  Last colon cancer screening:Had colonoscopy 12/2012 with Dr. Dulce Sellarutlaw, [facility], normal  -Alcohol, Tobacco, drug use: see social history  Review of Systems - no fevers, unintentional weight loss, vision loss, hearing loss, chest pain, sob, hemoptysis, melena, hematochezia, hematuria, genital discharge, changing or concerning skin lesions, bleeding, bruising, loc, thoughts of self harm or SI  Past Medical History:  Diagnosis Date  . Arthritis    "knees" (05/19/2013)  . History of cardiac radiofrequency ablation 04/2013  . Hx of echocardiogram    Echo (12/15):  EF 60-65%, no RWMA, Gr 1 DD  . SVT (supraventricular tachycardia) (HCC)   . WPW (Wolff-Parkinson-White syndrome) ~ 1983   a. s/p ablation 05/20/13.    Past Surgical History:  Procedure Laterality Date  . ANAL FISSURE REPAIR  1980's  . BREAST BIOPSY Left 2000's  . EXCISION MORTON'S NEUROMA Left 05/04/2013   Procedure: LEFT SECOND MT  WEIL/SECOND WEBB SPACE NEUROMA EXCISION ;  Surgeon: Toni ArthursJohn Hewitt, MD;  Location: [facility] OR;  Service: Orthopedics;  Laterality: Left;  . FOOT SURGERY Left  09/2014   hardware removal  . SUPRAVENTRICULAR TACHYCARDIA ABLATION  05/19/2013  . SUPRAVENTRICULAR TACHYCARDIA ABLATION N/A 05/19/2013   Procedure: SUPRAVENTRICULAR TACHYCARDIA ABLATION;  Surgeon: Marinus MawGregg W Taylor, MD;  [facility];  Service: Cardiovascular;  Laterality: N/A;    Family History  Problem Relation Age of Onset  . Diabetes Mother   . Depression Mother   . Melanoma Mother   . Heart attack Neg Hx     Social History   Social History  . Marital status: Married    Spouse name: Loraine LericheMark  . Number of children: 2  . Years of education: BS   Occupational History  .  [employer]    [place]   Social History Main Topics  . Smoking status: Never Smoker  . Smokeless tobacco: Never Used  . Alcohol use 2.4 oz/week    4 Glasses of wine per week     Comment: 1-2 glasses occassionaly  . Drug use: No  . Sexual activity: Yes    Birth control/ protection: Inserts   Other Topics Concern  . None   Social History Narrative   Patient is married and lives at home with her husband.     New job in 2017, [employer]   No regular exercise - diet is ok.        Current Outpatient Prescriptions:  .  Calcium Carb-Cholecalciferol (CALCIUM + D3 PO), Take 600 mg by mouth 2 (two) times daily., Disp: , Rfl:  .  Cetirizine HCl (ZYRTEC ALLERGY PO), Take 1 tablet by mouth as needed (for  allergies)., Disp: , Rfl:  .  diphenhydrAMINE (BENADRYL) 25 MG tablet, Take 12.5-25 mg by mouth at bedtime as needed for sleep., Disp: , Rfl:  .  Misc Natural Products (OSTEO BI-FLEX ADV TRIPLE ST) TABS, Take 1 tablet by mouth daily. , Disp: , Rfl:  .  Multiple Vitamin (MULTIVITAMINS PO), Take by mouth., Disp: , Rfl:  .  vitamin C (ASCORBIC ACID) 500 MG tablet, Take 500 mg by mouth daily., Disp: , Rfl:   EXAM:  Vitals:   02/19/17 1132  BP: 90/70  Pulse: 73  Temp: 97.7 F (36.5 C)    GENERAL: vitals reviewed and listed below, alert, oriented, appears well hydrated and in no  acute distress  HEENT: head atraumatic, PERRLA, normal appearance of eyes, ears, nose and mouth. moist mucus membranes.  NECK: supple, no masses or lymphadenopathy  LUNGS: clear to auscultation bilaterally, no rales, rhonchi or wheeze  CV: HRRR, no peripheral edema or cyanosis, normal pedal pulses  ABDOMEN: bowel sounds normal, soft, non tender to palpation, no masses, no rebound or guarding  SKIN: no rash or abnormal lesions, full skin exam declined, does with dermatologist  MS: normal gait, moves all extremities normally  GU/BREAST: Declined, does with GYN  NEURO: normal gait, speech and thought processing grossly intact, muscle tone grossly intact throughout  PSYCH: normal affect, pleasant and cooperative  ASSESSMENT AND PLAN:  Discussed the following assessment and plan:  Encounter for preventive health examination - Plan: CBC, Basic metabolic panel, Hemoglobin A1c, Lipid panel   -Discussed and advised all [place] preventive services health task force level A and B recommendations for age, sex and risks.  -Advised at least 150 minutes of exercise per week and a healthy diet with avoidance of (less then 1 serving per week) processed foods, white starches, red meat, fast foods and sweets and consisting of: * 5-9 servings of fresh fruits and vegetables (not corn or potatoes) *nuts and seeds, beans *olives and olive oil *lean meats such as fish and white chicken  *whole grains  -labs, studies and vaccines per orders this encounter  Orders Placed This Encounter  Procedures  . CBC  . Basic metabolic panel  . Hemoglobin A1c  . Lipid panel    Patient advised to return to clinic immediately if symptoms worsen or persist or new concerns.  Patient Instructions  BEFORE YOU LEAVE: -follow up: yearly for physical and as needed -labs  We have ordered labs or studies at this visit. It can take up to 1-2 weeks for results and processing. IF results require follow up or  explanation, we will call you with instructions. Clinically stable results will be released to your [facility]. If you have not heard from [place] or cannot find your results in [facility] in 2 weeks please contact our office at [phone].  If you are not yet signed up for [facility], please consider signing up.   We recommend the following healthy lifestyle for LIFE: 1) Small portions.   Tip: eat off of a salad plate instead of a dinner plate.   Tip: if you need more or a snack choose fruits, veggies and/or a handful of nuts or seeds.  2) Eat a healthy clean diet.  * Tip: Avoid (less then 1 serving per week): processed foods, sweets, sweetened drinks, white starches (rice, flour, bread, potatoes, pasta, etc), red meat, fast foods, butter  *Tip: CHOOSE instead   * 5-9 servings per day of fresh or frozen fruits and vegetables (but not corn, potatoes, bananas,  canned or dried fruit)   *nuts and seeds, beans   *olives and olive oil   *small portions of lean meats such as fish and white chicken    *small portions of whole grains  3)Get at least 150 minutes of sweaty aerobic exercise per week.  4)Reduce stress - consider counseling, meditation and relaxation to balance other aspects of your life.  We now offer   [facility] [facility]!!!  Same Day Appointments for Acute Care  Such as: Sprains, Injuries, cuts, abrasions, rashes, muscle pain, joint pain, back pain Colds, flu, sore throats, cough, fever, ear pain, sinus and eye infections Abdominal pain, nausea, vomiting, diarrhea, upset stomach Animal/insect bites  3 Easy Ways to Schedule: Walk-In Scheduling Call in scheduling Mychart Sign-up: [email or web address]                  No Follow-up on file.  Kriste Basque R., DO

## 2017-02-19 ENCOUNTER — Ambulatory Visit (INDEPENDENT_AMBULATORY_CARE_PROVIDER_SITE_OTHER): Payer: Commercial Managed Care - HMO | Admitting: Family Medicine

## 2017-02-19 ENCOUNTER — Encounter: Payer: Self-pay | Admitting: Family Medicine

## 2017-02-19 VITALS — BP 90/70 | HR 73 | Temp 97.7°F | Ht 65.0 in | Wt 152.7 lb

## 2017-02-19 DIAGNOSIS — Z0000 Encounter for general adult medical examination without abnormal findings: Secondary | ICD-10-CM

## 2017-02-19 LAB — BASIC METABOLIC PANEL
BUN: 14 mg/dL (ref 6–23)
CALCIUM: 9.5 mg/dL (ref 8.4–10.5)
CO2: 29 meq/L (ref 19–32)
CREATININE: 0.6 mg/dL (ref 0.40–1.20)
Chloride: 103 mEq/L (ref 96–112)
GFR: 110.07 mL/min (ref >60.00)
GLUCOSE: 88 mg/dL (ref 70–99)
Potassium: 4 mEq/L (ref 3.5–5.1)
Sodium: 139 mEq/L (ref 135–145)

## 2017-02-19 LAB — LIPID PANEL
CHOL/HDL RATIO: 3
Cholesterol: 149 mg/dL (ref 0–200)
HDL: 52.2 mg/dL (ref >39.00)
LDL CALC: 72 mg/dL (ref 0–99)
NONHDL: 97.22
Triglycerides: 125 mg/dL (ref 0.0–149.0)
VLDL: 25 mg/dL (ref 0.0–40.0)

## 2017-02-19 LAB — CBC
HEMATOCRIT: 40.2 % (ref 36.0–46.0)
HEMOGLOBIN: 13.4 g/dL (ref 12.0–15.0)
MCHC: 33.3 g/dL (ref 30.0–36.0)
MCV: 90.2 fl (ref 78.0–100.0)
Platelets: 381 10*3/uL (ref 150.0–400.0)
RBC: 4.46 Mil/uL (ref 3.87–5.11)
RDW: 13.1 % (ref 11.5–15.5)
WBC: 6.6 10*3/uL (ref 4.0–10.5)

## 2017-02-19 LAB — HEMOGLOBIN A1C: Hgb A1c MFr Bld: 5.6 % (ref 4.6–6.5)

## 2017-02-19 NOTE — Progress Notes (Signed)
Pre visit review using our clinic review tool, if applicable. No additional management support is needed unless otherwise documented below in the visit note. 

## 2017-02-19 NOTE — Patient Instructions (Signed)
BEFORE YOU LEAVE: -follow up: yearly for physical and as needed -labs  We have ordered labs or studies at this visit. It can take up to 1-2 weeks for results and processing. IF results require follow up or explanation, we will call you with instructions. Clinically stable results will be released to your [facility]. If you have not heard from us or cannot find your results in [facility] in 2 weeks please contact our office at [phone].  If you are not yet signed up for [facility], please consider signing up.   We recommend the following healthy lifestyle for LIFE: 1) Small portions.   Tip: eat off of a salad plate instead of a dinner plate.   Tip: if you need more or a snack choose fruits, veggies and/or a handful of nuts or seeds.  2) Eat a healthy clean diet.  * Tip: Avoid (less then 1 serving per week): processed foods, sweets, sweetened drinks, white starches (rice, flour, bread, potatoes, pasta, etc), red meat, fast foods, butter  *Tip: CHOOSE instead   * 5-9 servings per day of fresh or frozen fruits and vegetables (but not corn, potatoes, bananas, canned or dried fruit)   *nuts and seeds, beans   *olives and olive oil   *small portions of lean meats such as fish and white chicken    *small portions of whole grains  3)Get at least 150 minutes of sweaty aerobic exercise per week.  4)Reduce stress - consider counseling, meditation and relaxation to balance other aspects of your life.  We now offer   [facility] [facility]!!!  Same Day Appointments for Acute Care  Such as: Sprains, Injuries, cuts, abrasions, rashes, muscle pain, joint pain, back pain Colds, flu, sore throats, cough, fever, ear pain, sinus and eye infections Abdominal pain, nausea, vomiting, diarrhea, upset stomach Animal/insect bites  3 Easy Ways to Schedule: Walk-In Scheduling Call in scheduling Mychart Sign-up: [email or web address]

## 2017-05-31 ENCOUNTER — Other Ambulatory Visit: Payer: Self-pay | Admitting: Obstetrics and Gynecology

## 2017-06-24 ENCOUNTER — Ambulatory Visit (INDEPENDENT_AMBULATORY_CARE_PROVIDER_SITE_OTHER): Payer: 59 | Admitting: Family Medicine

## 2017-06-24 ENCOUNTER — Encounter: Payer: Self-pay | Admitting: Family Medicine

## 2017-06-24 VITALS — Temp 98.0°F | Ht 65.0 in | Wt 154.2 lb

## 2017-06-24 DIAGNOSIS — M25562 Pain in left knee: Secondary | ICD-10-CM

## 2017-06-24 NOTE — Progress Notes (Signed)
HPI:  Acute visit for L knee pain: -started about 1 week ago after twisted knee -pain in medial joint with certain activities -ice and bio-freeze help -denies: weakness, numbness, giving away, catching, locking, swelling, redness -has known bilat PFS per her reports and has had popping sounds unde rknees for years - does exercises for this that and orthopedic doc gave her  ROS: See pertinent positives and negatives per HPI.  Past Medical History:  Diagnosis Date  . Arthritis    "knees" (05/19/2013)  . History of cardiac radiofrequency ablation 04/2013  . Hx of echocardiogram    Echo (12/15):  EF 60-65%, no RWMA, Gr 1 DD  . SVT (supraventricular tachycardia) (HCC)   . WPW (Wolff-Parkinson-White syndrome) ~ 1983   a. s/p ablation 05/20/13.    Past Surgical History:  Procedure Laterality Date  . ANAL FISSURE REPAIR  1980's  . BREAST BIOPSY Left 2000's  . EXCISION MORTON'S NEUROMA Left 05/04/2013   Procedure: LEFT SECOND MT  WEIL/SECOND WEBB SPACE NEUROMA EXCISION ;  Surgeon: [name], MD;  Location: [facility] OR;  Service: Orthopedics;  Laterality: Left;  . FOOT SURGERY Left 09/2014   hardware removal  . SUPRAVENTRICULAR TACHYCARDIA ABLATION  05/19/2013  . SUPRAVENTRICULAR TACHYCARDIA ABLATION N/A 05/19/2013   Procedure: SUPRAVENTRICULAR TACHYCARDIA ABLATION;  Surgeon: Marinus Maw, MD;  Location: [facility] CATH LAB;  Service: Cardiovascular;  Laterality: N/A;    Family History  Problem Relation Age of Onset  . Diabetes Mother   . Depression Mother   . Melanoma Mother   . Heart attack Neg Hx     Social History   Social History  . Marital status: Married    Spouse name: Loraine Leriche  . Number of children: 2  . Years of education: BS   Occupational History  .  [employer]    [employer]   Social History Main Topics  . Smoking status: Never Smoker  . Smokeless tobacco: Never Used  . Alcohol use 2.4 oz/week    4 Glasses of wine per week     Comment: 1-2 glasses occassionaly  . Drug  use: No  . Sexual activity: Yes    Birth control/ protection: Inserts   Other Topics Concern  . None   Social History Narrative   Patient is married and lives at home with her husband.     New job in 2017, [employer]   No regular exercise - diet is ok.        Current Outpatient Prescriptions:  .  Calcium Carb-Cholecalciferol (CALCIUM + D3 PO), Take 600 mg by mouth 2 (two) times daily., Disp: , Rfl:  .  Cetirizine HCl (ZYRTEC ALLERGY PO), Take 1 tablet by mouth as needed (for allergies)., Disp: , Rfl:  .  diphenhydrAMINE (BENADRYL) 25 MG tablet, Take 12.5-25 mg by mouth at bedtime as needed for sleep., Disp: , Rfl:  .  Misc Natural Products (OSTEO BI-FLEX ADV TRIPLE ST) TABS, Take 1 tablet by mouth daily. , Disp: , Rfl:  .  Multiple Vitamin (MULTIVITAMINS PO), Take by mouth., Disp: , Rfl:  .  vitamin C (ASCORBIC ACID) 500 MG tablet, Take 500 mg by mouth daily., Disp: , Rfl:   EXAM:  Vitals:   06/24/17 1709  Temp: 98 F (36.7 C)    Body mass index is 25.66 kg/m.  GENERAL: vitals reviewed and listed above, alert, oriented, appears well hydrated and in no acute distress  HEENT: atraumatic, conjunttiva clear, no obvious abnormalities on inspection of  external nose and ears  MS: moves all extremities without noticeable abnormality, gait is normal,normal inspection of both knees, positive patellar crepitus bilaterally, mild VMO atrophy bilaterally, mild J sign bilaterally, negative testing includes: Negative McMurray, negative valgus and varus stress, negative Lachman, negative drawer testing, neurovascularly intact distally  PSYCH: pleasant and cooperative, no obvious depression or anxiety  ASSESSMENT AND PLAN:  Discussed the following assessment and plan:  Acute pain of left knee  -we discussed possible serious and likely etiologies, workup and treatment, treatment risks and return precautions - suspect patellofemoral syndrome and mild strain, possible mild  meniscal tear for OA -after this discussion, Ohanna opted for home exercises, symptomatic care with topical menthol, modification of activity -follow up advised 3-4 weeks -of course, we advised Kenzli  to return or notify a doctor immediately if symptoms worsen or persist or new concerns arise.  Declined AVS. There are no Patient Instructions on file for this visit.  Kriste BasqueKIM, Rekisha Welling R., DO

## 2017-06-25 ENCOUNTER — Ambulatory Visit: Payer: Commercial Managed Care - HMO | Admitting: Family Medicine

## 2017-07-14 ENCOUNTER — Telehealth: Payer: Self-pay | Admitting: Family Medicine

## 2017-07-14 NOTE — Telephone Encounter (Addendum)
Pt was seen on 06/24/17 for knee pain. Pt knee is still bothering her. Please advise. Pt is aware md out of office today

## 2017-07-15 ENCOUNTER — Telehealth: Payer: Self-pay | Admitting: *Deleted

## 2017-07-15 DIAGNOSIS — M25562 Pain in left knee: Secondary | ICD-10-CM

## 2017-07-15 NOTE — Telephone Encounter (Signed)
We recommended 3-4 week follow up. Please ensure scheduled. Sooner if worsening or not improving at all with the treatments from the appt.Thanks.

## 2017-07-15 NOTE — Telephone Encounter (Signed)
I left a detailed message with information below at the pts cell number.

## 2017-07-15 NOTE — Telephone Encounter (Signed)
I called the pt and informed her of the message below and she is aware someone will call with appt info.

## 2017-07-15 NOTE — Telephone Encounter (Signed)
I would advise seeing sports med. Ok to send referral - they will be able to do any imaging if needed. Thanks.

## 2017-07-15 NOTE — Telephone Encounter (Signed)
Patient called back stating she is not coming for an appt and feels like she needs a scan of some type as she is the same.  States she needs treatment and if Dr Selena BattenKim cannot do this she will go somewhere else. Message forwarded to Dr Selena BattenKim.

## 2017-07-16 ENCOUNTER — Telehealth: Payer: Self-pay | Admitting: Family Medicine

## 2017-07-16 DIAGNOSIS — M25562 Pain in left knee: Secondary | ICD-10-CM | POA: Diagnosis not present

## 2017-07-16 NOTE — Telephone Encounter (Signed)
Spoke with patient and she has spoken to Dr Selena BattenKim

## 2017-07-16 NOTE — Telephone Encounter (Signed)
I called the pt and informed her of the message below.  She stated she would like to have Dr Selena BattenKim call her to discuss this as she is very disappointed that an MRI was not ordered.

## 2017-07-16 NOTE — Telephone Encounter (Signed)
Spoke with pt. Continued pain and she is frustrated it is not better and wants to feel better right now as it is impacting her mood. She is not doing the exercises. Taking aleve at times and does not want to have to take this. [place] and [place] her pain. Discussed options including potential etiologies, various imaging modalities, and treatments for common pathologies. She decided she will go to ortho urgent care at her ortho office or one of the offices in town.

## 2017-07-16 NOTE — Telephone Encounter (Signed)
Pt calling stating that [place] done before going to see Dr. Berline Choughigby so she will have a game plan for her treatment.  [facility] [place] [facility].

## 2017-07-16 NOTE — Telephone Encounter (Signed)
I would recommend waiting to see Dr. Berline Choughigby first as he may be able to make a dx and plan with other types of imaging that he can do there that day. Otherwise, if worsening or severe symptoms she could follow up with us or her orthopedic doctor that she saw before for in [place] or could go to ortho urgent care in [place] if she prefers in the interim and feels can not wait until her appointment.

## 2017-07-26 ENCOUNTER — Ambulatory Visit (INDEPENDENT_AMBULATORY_CARE_PROVIDER_SITE_OTHER): Payer: 59 | Admitting: Sports Medicine

## 2017-07-26 ENCOUNTER — Ambulatory Visit: Payer: Self-pay

## 2017-07-26 ENCOUNTER — Encounter: Payer: Self-pay | Admitting: Sports Medicine

## 2017-07-26 VITALS — BP 112/68 | HR 90 | Ht 65.0 in | Wt 156.8 lb

## 2017-07-26 DIAGNOSIS — S8992XA Unspecified injury of left lower leg, initial encounter: Secondary | ICD-10-CM | POA: Diagnosis not present

## 2017-07-26 DIAGNOSIS — S99922A Unspecified injury of left foot, initial encounter: Secondary | ICD-10-CM

## 2017-07-26 DIAGNOSIS — M25562 Pain in left knee: Secondary | ICD-10-CM | POA: Diagnosis not present

## 2017-07-26 NOTE — Patient Instructions (Signed)

## 2017-07-26 NOTE — Progress Notes (Signed)
OFFICE VISIT NOTE Laura Wheeler D. Laura Shinerigby, Wheeler  [facility] [phone]  Carmie KannerRobin D Wheeler - 56 y.o. feVeverly Fellsmale MRN [id number]  Date of birth: Nov 11, 1961  Visit Date: 07/26/2017  PCP: Laura Wheeler, Laura Wheeler   Referred by: Laura Wheeler, Laura Wheeler  Laura DakinBrandy Wheeler, CMA acting as scribe for Dr. Berline Choughigby.  SUBJECTIVE:   Chief Complaint  Patient presents with  . New Patient (Initial Visit)    left knee pain   HPI: As below and per problem based documentation when appropriate.  Laura BallRobin presents today as a new patient with complaint of left knee pain. She had xray done last Friday (06/22/2017) at [facility]. Pain started after twisting her knee the Friday before seeing her PCP (06/18/17). She has prior hx of surgery on the left foot (Dr Victorino DikeHewitt) and is unable to bend her toes now. She says that she didn't actually fall, she tripped on her dog and grabbed on to the railing and twisted her knee. Pain is in the medial joint. She has been icing her knee 1-2 x per day. She was given a brace to wear when she was seen at the orthopedic office and hasn't noticed much relief, it just causes her to be more aware of her knee and slow down. She did not wear the brace into the office today. She reports that she must go down stairs sideways d/t previous surgery on the left foot. She has also been dx with arthritis. She has been provided with home exercises to help strengthen the muscles in her legs. The pain is worse after walking around. She ices the knee before bed and has tried using Biofreeze and CBD with minimal relief. She has also tried using Voltaren gel and gotten little relief. She hasn't noticed any swelling around the knee, even after the incident first occurred because she iced it right away and has "stayed on top of icing it". She has tried Ibuprofen and Aleve with minimal relief. She thinks that she may have heard a pop when the incident  first occurred. She has a hard time describing the pain, its tender at night when her knees touch but using a wedge between her knees does help.     Review of Systems  Constitutional: Negative for chills and fever.  Respiratory: Negative for shortness of breath and wheezing.   Cardiovascular: Negative for chest pain, palpitations and leg swelling.  Musculoskeletal: Positive for falls (didn't fall, just "twisted") and joint pain.  Neurological: Positive for tingling (s/p left foot surgery). Negative for dizziness and headaches.  Endo/Heme/Allergies: Does not bruise/bleed easily.    Otherwise per HPI.  HISTORY & PERTINENT PRIOR DATA:  No specialty comments available. She reports that she has never smoked. She has never used smokeless tobacco.   Recent Labs  02/19/17 1213  HGBA1C 5.6   Medications & Allergies reviewed per EMR Patient Active Problem List   Diagnosis Date Noted  . Nephrolithiasis 01/15/2016  . Palpitations 01/15/2015  . Plantar plate injury 82/95/621310/09/2013  . Neuropathic pain of left foot 09/12/2013  . WOLFF (WOLFE)-PARKINSON-WHITE (WPW) SYNDROME 09/30/2009  . Allergic rhinitis 09/30/2009   Past Medical History:  Diagnosis Date  . Arthritis    "knees" (05/19/2013)  . History of cardiac radiofrequency ablation 04/2013  . Hx of echocardiogram    Echo (12/15):  EF 60-65%, no RWMA, Gr 1 DD  . SVT (supraventricular tachycardia) (HCC)   . WPW (Wolff-Parkinson-White syndrome) ~  1983   a. s/p ablation 05/20/13.   Family History  Problem Relation Age of Onset  . Diabetes Mother   . Depression Mother   . Melanoma Mother   . Heart attack Neg Hx    Past Surgical History:  Procedure Laterality Date  . ANAL FISSURE REPAIR  1980's  . BREAST BIOPSY Left 2000's  . EXCISION MORTON'S NEUROMA Left 05/04/2013   Procedure: LEFT SECOND MT  WEIL/SECOND WEBB SPACE NEUROMA EXCISION ;  Surgeon: [name], MD;  Location: [facility] OR;  Service: Orthopedics;  Laterality: Left;  . FOOT SURGERY  Left 09/2014   hardware removal  . SUPRAVENTRICULAR TACHYCARDIA ABLATION  05/19/2013  . SUPRAVENTRICULAR TACHYCARDIA ABLATION N/A 05/19/2013   Procedure: SUPRAVENTRICULAR TACHYCARDIA ABLATION;  Surgeon: Marinus Maw, MD;  Location: [facility] CATH LAB;  Service: Cardiovascular;  Laterality: N/A;   Social History   Occupational History  .  [employer]    [employer]   Social History Main Topics  . Smoking status: Never Smoker  . Smokeless tobacco: Never Used  . Alcohol use 2.4 oz/week    4 Glasses of wine per week     Comment: 1-2 glasses occassionaly  . Drug use: No  . Sexual activity: Yes    Birth control/ protection: Inserts    OBJECTIVE:  VS:  HT:5\' 5"  (165.1 cm)   WT:156 lb 12.8 oz (71.1 kg)  BMI:26.1    BP:112/68  HR:90bpm  TEMP: ( )  RESP:96 % EXAM: Findings:  WDWN, NAD, Non-toxic appearing Alert & appropriately interactive Not depressed or anxious appearing No increased work of breathing. Pupils are equal. EOM intact without nystagmus No clubbing or cyanosis of the extremities appreciated No significant rashes/lesions/ulcerations overlying the examined area. DP & PT pulses 2+/4.  No significant pretibial edema.  No clubbing or cyanosis Sensation intact to light touch in lower extremities.  Left Knee: Overall joint is well aligned, no significant deformity.   No significant effusion.   ROM: 0 to 120.   Extensor mechanism intact No focal medial or lateral joint line pain.  She does have some pain with palpation of the lateral trochlea and patellar facet..   Stable to varus/valgus strain & anterior/posterior drawer.  .   Negative McMurray's and Thessaly  Bilateral feet with significant loss of the transverse arch.  She has a Morton's callus that is present.  Pain over the metatarsal.  Postsurgical scarring is well-healed..        ASSESSMENT & PLAN:     ICD-10-CM   1. Left knee pain, unspecified chronicity M25.562 [place] LIMITED JOINT SPACE STRUCTURES LOW LEFT(NO  LINKED CHARGES)  2. Injury of plantar plate of left foot, initial encounter S89.92XA   =================================================================  She has associated loss of her transverse arch and significant metatarsal pain secondary to this.  Custom cushioned orthotics with [employer] would likely be very beneficial and we additionally added a very small metatarsal pad to her shoes today.  If she would like to follow up for [facility] she will call to make an appointment for these.  Otherwise we will see her back in 6 weeks for her left knee pain that was injected today.  Therapeutic exercises also reviewed.  Compression recommended. =================================================================   Follow-up: Return in about 2 weeks (around 08/09/2017), or if symptoms worsen or fail to improve, for Orthotics.  Otherwise in 6 weeks  CMA/ATC served as scribe during this visit. History, Physical, and Plan performed by medical provider. Documentation and orders reviewed and attested  to.      Teresa Coombs, [place] Physician

## 2017-07-30 ENCOUNTER — Other Ambulatory Visit: Payer: Self-pay | Admitting: Obstetrics and Gynecology

## 2017-07-30 DIAGNOSIS — Z1231 Encounter for screening mammogram for malignant neoplasm of breast: Secondary | ICD-10-CM

## 2017-08-09 ENCOUNTER — Ambulatory Visit: Payer: 59 | Admitting: Sports Medicine

## 2017-08-10 ENCOUNTER — Ambulatory Visit
Admission: RE | Admit: 2017-08-10 | Discharge: 2017-08-10 | Disposition: A | Discharge status: Home / Self Care | Payer: 59 | Source: Ambulatory Visit | Attending: Obstetrics and Gynecology | Admitting: Obstetrics and Gynecology

## 2017-08-10 DIAGNOSIS — Z1231 Encounter for screening mammogram for malignant neoplasm of breast: Secondary | ICD-10-CM | POA: Diagnosis not present

## 2017-08-12 ENCOUNTER — Ambulatory Visit: Payer: 59 | Admitting: Sports Medicine

## 2017-08-14 NOTE — Procedures (Signed)
PROCEDURE NOTE -  ULTRASOUND GUIDEDINJECTION: Left Knee Images were obtained and interpreted by myself, Gaspar Bidding, DO  Images have been saved and stored to PACS system. Images obtained on: GE S7 Ultrasound machine  ULTRASOUND FINDINGS: Moderate degenerative changes of the medial joint line with slight osteophytic spurring and bulging of the medial meniscus.  She has additional degenerative spurring along the lateral trochlea with notch view.  No significant effusion.  DESCRIPTION OF PROCEDURE:  The patient's clinical condition is marked by substantial pain and/or significant functional disability. Other conservative therapy has not provided relief, is contraindicated, or not appropriate. There is a reasonable likelihood that injection will significantly improve the patient's pain and/or functional impairment. After discussing the risks, benefits and expected outcomes of the injection and all questions were reviewed and answered, the patient wished to undergo the above named procedure. Verbal consent was obtained. The ultrasound was used to identify the target structure and adjacent neurovascular structures. The skin was then prepped in sterile fashion and the target structure was injected under direct visualization using sterile technique as below: PREP: Alcohol, Ethel Chloride APPROACH: Superior lateral, single injection 25g 1.5" needle INJECTATE: 2cc 1% lidocaine, 2cc 40mg  DepoMedrol ASPIRATE: N/A DRESSING: Band-Aid  Post procedural instructions including recommending icing and warning signs for infection were reviewed. This procedure was well tolerated and there were no complications.   IMPRESSION: Succesful US Guided Injection

## 2017-08-14 NOTE — Assessment & Plan Note (Signed)
She has associated loss of her transverse arch and significant metatarsal pain secondary to this.  Custom cushioned orthotics with [employer] would likely be very beneficial and we additionally added a very small metatarsal pad to her shoes today.  If she would like to follow up for [facility] she will call to make an appointment for these.  Otherwise we will see her back in 6 weeks for her left knee pain that was injected today.  Therapeutic exercises also reviewed.  Compression recommended.

## 2017-08-30 ENCOUNTER — Ambulatory Visit (INDEPENDENT_AMBULATORY_CARE_PROVIDER_SITE_OTHER): Payer: 59 | Admitting: Sports Medicine

## 2017-08-30 ENCOUNTER — Encounter: Payer: Self-pay | Admitting: Sports Medicine

## 2017-08-30 VITALS — BP 104/68 | HR 77 | Ht 65.0 in | Wt 155.8 lb

## 2017-08-30 DIAGNOSIS — S99922A Unspecified injury of left foot, initial encounter: Secondary | ICD-10-CM

## 2017-08-30 DIAGNOSIS — R269 Unspecified abnormalities of gait and mobility: Secondary | ICD-10-CM

## 2017-08-30 DIAGNOSIS — M25562 Pain in left knee: Secondary | ICD-10-CM | POA: Diagnosis not present

## 2017-08-30 DIAGNOSIS — S8992XA Unspecified injury of left lower leg, initial encounter: Secondary | ICD-10-CM

## 2017-08-30 NOTE — Assessment & Plan Note (Signed)
Patient has antalgic gait and left medial knee pain that are consistent with a significant foot etiology.  Custom cushioned insoles fabricated today with overall good improvement in her symptoms and reported good comfort with these.  She will follow-up in 6 weeks for clinical reevaluation and sooner if any worsening medial knee pain.  PROCEDURE NOTE : CUSTOM ORTHOTICS The patient was fitted for a standard, cushioned, semi-rigid orthotic. The orthotic was heated & placed on the orthotic stand. The patient was positioned in subtalar neutral position and 10 of ankle dorsiflexion and weight bearing stance some heated orthotic blank. After completion of the molding a stable paste was applied to the orthotic blank. The orthotic was ground to a stable position for weightbearing. The patient ambulated in these and reported they were comfortable without pressure spots.  Blank:  Size 7 mens - Standard Cushioned  Base:  Blue EVA  Postings:  Small Met Pads added bilaterally

## 2017-08-30 NOTE — Progress Notes (Signed)
OFFICE VISIT NOTE Michael D. Rigby, DO  [facility] [phone]  Laura Wheeler - 56 y.o. female MRN [id number]  Date of birth: 07/10/1961  Visit Date: 08/30/2017  PCP: Kim, Hannah R, DO   Referred by: Kim, Hannah R, DO  Brandy Shelton, CMA acting as scribe for Dr. Rigby.  SUBJECTIVE:   Chief Complaint  Patient presents with  . Follow-up    LT knee pain, foot pain   HPI: As below and per problem based documentation when appropriate.  Laura Wheeler is an established patient presenting today in follow-up of LT knee pain and injury to the plantar plate of the LT foot. She was last seen 07/26/2017 and had metatarsal pad placed into her LT shoe. She would like to be fitted for [employer] orthotics today.     ROS  Otherwise per HPI.  HISTORY & PERTINENT PRIOR DATA:  No specialty comments available. She reports that she has never smoked. She has never used smokeless tobacco.   Recent Labs  02/19/17 1213  HGBA1C 5.6   Medications & Allergies reviewed per EMR Patient Active Problem List   Diagnosis Date Noted  . Nephrolithiasis 01/15/2016  . Palpitations 01/15/2015  . Plantar plate injury 09/29/2013  . Neuropathic pain of left foot 09/12/2013  . WOLFF (WOLFE)-PARKINSON-WHITE (WPW) SYNDROME 09/30/2009  . Allergic rhinitis 09/30/2009   Past Medical History:  Diagnosis Date  . Arthritis    "knees" (05/19/2013)  . History of cardiac radiofrequency ablation 04/2013  . Hx of echocardiogram    Echo (12/15):  EF 60-65%, no RWMA, Gr 1 DD  . SVT (supraventricular tachycardia) (HCC)   . WPW (Wolff-Parkinson-White syndrome) ~ 1983   a. s/p ablation 05/20/13.   Family History  Problem Relation Age of Onset  . Diabetes Mother   . Depression Mother   . Melanoma Mother   . Heart attack Neg Hx    Past Surgical History:  Procedure Laterality Date  . ANAL FISSURE REPAIR  1980's  . BREAST BIOPSY Left 2000's  . EXCISION MORTON'S NEUROMA Left  05/04/2013   Procedure: LEFT SECOND MT  WEIL/SECOND WEBB SPACE NEUROMA EXCISION ;  Surgeon: John Hewitt, MD;  Location: [facility] OR;  Service: Orthopedics;  Laterality: Left;  . FOOT SURGERY Left 09/2014   hardware removal  . SUPRAVENTRICULAR TACHYCARDIA ABLATION  05/19/2013  . SUPRAVENTRICULAR TACHYCARDIA ABLATION N/A 05/19/2013   Procedure: SUPRAVENTRICULAR TACHYCARDIA ABLATION;  Surgeon: Gregg W Taylor, MD;  Location: [facility] CATH LAB;  Service: Cardiovascular;  Laterality: N/A;   Social History   Occupational History  .  [employer]    [employer]   Social History Main Topics  . Smoking status: Never Smoker  . Smokeless tobacco: Never Used  . Alcohol use 2.4 oz/week    4 Glasses of wine per week     Comment: 1-2 glasses occassionaly  . Drug use: No  . Sexual activity: Yes    Birth control/ protection: Inserts    OBJECTIVE:  VS:  HT:5' 5" (165.1 cm)   WT:155 lb 12.8 oz (70.7 kg)  BMI:25.93    BP:104/68  HR:77bpm  TEMP: ( )  RESP:97 % EXAM: Findings:  Slight antalgic gait with external rotation of the right greater than left leg.  She walks slightly uneasy and has marked tenderness over the plantar aspect of her metatarsal heads.     Mm Screening Breast Tomo Bilateral  Result Date: 08/11/2017 CLINICAL DATA:  Screening. EXAM: 2D DIGITAL SCREENING BILATERAL   MAMMOGRAM WITH CAD AND ADJUNCT TOMO COMPARISON:  Previous exam(s). ACR Breast Density Category c: The breast tissue is heterogeneously dense, which may obscure small masses. FINDINGS: There are no findings suspicious for malignancy. Images were processed with CAD. IMPRESSION: No mammographic evidence of malignancy. A result letter of this screening mammogram will be mailed directly to the patient. RECOMMENDATION: Screening mammogram in one year. (Code:SM-B-01Y) BI-RADS CATEGORY  1: Negative. Electronically Signed   By: David  Ormond M.D.   On: 08/11/2017 14:38   ASSESSMENT & PLAN:     ICD-10-CM   1. Left knee pain, unspecified  chronicity M25.562   2. Gait disturbance R26.9   3. Injury of plantar plate of left foot, initial encounter S89.92XA   ================================================================= Plantar plate injury Patient has antalgic gait and left medial knee pain that are consistent with a significant foot etiology.  Custom cushioned insoles fabricated today with overall good improvement in her symptoms and reported good comfort with these.  She will follow-up in 6 weeks for clinical reevaluation and sooner if any worsening medial knee pain.  PROCEDURE NOTE : CUSTOM ORTHOTICS The patient was fitted for a standard, cushioned, semi-rigid orthotic. The orthotic was heated & placed on the orthotic stand. The patient was positioned in subtalar neutral position and 10 of ankle dorsiflexion and weight bearing stance some heated orthotic blank. After completion of the molding a stable paste was applied to the orthotic blank. The orthotic was ground to a stable position for weightbearing. The patient ambulated in these and reported they were comfortable without pressure spots.  Blank:  Size 7 mens - Standard Cushioned  Base:  Blue EVA  Postings:  Small Met Pads added bilaterally     =================================================================  Follow-up: No Follow-up on file.   CMA/ATC served as scribe during this visit. History, Physical, and Plan performed by medical provider. Documentation and orders reviewed and attested to.      Michael Rigby, DO    [place] [facility] Physician  

## 2017-09-09 ENCOUNTER — Encounter: Payer: Self-pay | Admitting: Family Medicine

## 2017-09-19 DIAGNOSIS — Z23 Encounter for immunization: Secondary | ICD-10-CM | POA: Diagnosis not present

## 2017-10-11 ENCOUNTER — Ambulatory Visit: Payer: 59 | Admitting: Sports Medicine

## 2017-10-11 ENCOUNTER — Ambulatory Visit: Payer: Self-pay | Admitting: Sports Medicine

## 2017-12-30 ENCOUNTER — Encounter: Payer: Self-pay | Admitting: Family Medicine

## 2018-02-22 NOTE — Progress Notes (Signed)
HPI:  Here for CPE:  -Concerns and/or follow up today:  Sees gynecologist for her gynecology exams and reports she had a Pap smear, mammogram and pelvic in the summer 2018. Sees dermatologist at Dr. Meredith Staggers office for skin exams.  Reports she has her skin exam coming up next month. She has past medical history of Wolff-Parkinson-White, status post ablation, managed by cardiology.  She also sees a urologist for nephrolithiasis.  She has seen orthopedic [facility] in the past for knee and foot pain. She unfortunately had the flu in December and this was seen at an urgent care.  She had lab work done and this did show elevated platelets.  She would like to recheck this today.  She has felt well since.  No unexplained weight loss, bleeding, fevers or malaise since recovering from the flu.  -Diet: variety of foods, balance and well rounded, larger portion sizes -Exercise: no regular exercise -Taking folic acid, vitamin D or calcium: no -Diabetes and Dyslipidemia Screening: Not fasting, had screening last year that was good -Vaccines: see vaccine section EPIC -pap history: Sees GYN -FDLMP: see nursing notes -sexual activity: yes, female partner, no new partners -wants STI testing (Hep C if born [birth year]): no -FH breast, colon or ovarian ca: see FH Last mammogram: Last mammogram 07/2017 Last colon cancer screening: Had colonoscopy 12/2012 with Dr. Paulita Fujita at [facility] Assessment: see family history and pt history DEXA (>/= 65): Not applicable  -Alcohol, Tobacco, drug use: see social history  Review of Systems - no reported fevers, unintentional weight loss, vision loss, hearing loss, chest pain, sob, hemoptysis, melena, hematochezia, hematuria, genital discharge, changing or concerning skin lesions, bleeding, bruising, loc, thoughts of self harm or SI.  She jokes that she has mild depression from the poor weather and is waiting for spring.  Past Medical History:  Diagnosis Date  .  Arthritis    "knees" (05/19/2013)  . History of cardiac radiofrequency ablation 04/2013  . Hx of echocardiogram    Echo (12/15):  EF 60-65%, no RWMA, Gr 1 DD  . SVT (supraventricular tachycardia) ([place])   . WPW (Wolff-Parkinson-White syndrome) ~ 1983   a. s/p ablation 05/20/13.    Past Surgical History:  Procedure Laterality Date  . ANAL FISSURE REPAIR  1980's  . BREAST BIOPSY Left 2000's  . EXCISION MORTON'S NEUROMA Left 05/04/2013   Procedure: LEFT SECOND MT  WEIL/SECOND WEBB SPACE NEUROMA EXCISION ;  Surgeon: Wylene Simmer, MD;  Location: [facility];  Service: Orthopedics;  Laterality: Left;  . FOOT SURGERY Left 09/2014   hardware removal  . SUPRAVENTRICULAR TACHYCARDIA ABLATION  05/19/2013  . SUPRAVENTRICULAR TACHYCARDIA ABLATION N/A 05/19/2013   Procedure: SUPRAVENTRICULAR TACHYCARDIA ABLATION;  Surgeon: Evans Lance, MD;  Location: [facility] CATH LAB;  Service: Cardiovascular;  Laterality: N/A;    Family History  Problem Relation Age of Onset  . Diabetes Mother   . Depression Mother   . Melanoma Mother   . Heart attack Neg Hx     Social History   Socioeconomic History  . Marital status: Married    Spouse name: Elta Guadeloupe  . Number of children: 2  . Years of education: BS  . Highest education level: None  Social Needs  . Financial resource strain: None  . Food insecurity - worry: None  . Food insecurity - inability: None  . Transportation needs - medical: None  . Transportation needs - non-medical: None  Occupational History    Employer: [employer]  Comment: [place]  Tobacco Use  . Smoking status: Never Smoker  . Smokeless tobacco: Never Used  Substance and Sexual Activity  . Alcohol use: Yes    Alcohol/week: 2.4 oz    Types: 4 Glasses of wine per week    Comment: 1-2 glasses occassionaly  . Drug use: No  . Sexual activity: Yes    Birth control/protection: Inserts  Other Topics Concern  . None  Social History Narrative   Patient is married and lives at home with her  husband.     New job in 2017, Publishing rights manager   No regular exercise - diet is ok.     Current Outpatient Medications:  .  Calcium Carb-Cholecalciferol (CALCIUM + D3 PO), Take 600 mg by mouth 2 (two) times daily., Disp: , Rfl:  .  diclofenac sodium (VOLTAREN) 1 % GEL, Apply topically., Disp: , Rfl:  .  diphenhydrAMINE (BENADRYL) 25 MG tablet, Take 12.5-25 mg by mouth at bedtime as needed for sleep., Disp: , Rfl:  .  Multiple Vitamin (MULTIVITAMINS PO), Take by mouth., Disp: , Rfl:  .  OVER THE COUNTER MEDICATION, [place] supplement for joints, Disp: , Rfl:  .  OVER THE COUNTER MEDICATION, Shackley supplement for vitamin C, Disp: , Rfl:   EXAM:  Vitals:   02/24/18 0711  BP: 100/78  Pulse: 92    GENERAL: vitals reviewed and listed below, alert, oriented, appears well hydrated and in no acute distress  HEENT: head atraumatic, PERRLA, normal appearance of eyes, ears, nose and mouth. moist mucus membranes.  NECK: supple, no masses or lymphadenopathy  LUNGS: clear to auscultation bilaterally, no rales, rhonchi or wheeze  CV: HRRR, no peripheral edema or cyanosis, normal pedal pulses  ABDOMEN: bowel sounds normal, soft, non tender to palpation, no masses, no rebound or guarding  GU/BREAST: Declined, does with GYN  SKIN: no rash or abnormal lesions on visualized portions of skin, does her full skin check with her dermatologist  MS: normal gait, moves all extremities normally  NEURO: normal gait, speech and thought processing grossly intact, muscle tone grossly intact throughout  PSYCH: normal affect, pleasant and cooperative  ASSESSMENT AND PLAN:  Discussed the following assessment and plan:  PREVENTIVE EXAM: -Discussed and advised all [place] preventive services health task force level A and B recommendations for age, sex and risks. -Advised at least 150 minutes of exercise per week and a healthy diet with avoidance of (less then 1 serving per week) processed foods,  white starches, red meat, fast foods and sweets and consisting of: * 5-9 servings of fresh fruits and vegetables (not corn or potatoes) *nuts and seeds, beans *olives and olive oil *lean meats such as fish and white chicken  *whole grains -labs, studies and vaccines per orders this encounter - Hemoglobin A1c  2. Screening for depression -Negative  3. Elevated platelet count - CBC with Differential/Platelet Likely related to acute illness in December-  Patient advised to return to clinic immediately if symptoms worsen or persist or new concerns.  Patient Instructions  BEFORE YOU LEAVE: -Labs -follow up: Follow-up yearly and as needed  We have ordered labs or studies at this visit. It can take up to 1-2 weeks for results and processing. IF results require follow up or explanation, we will call you with instructions. Clinically stable results will be released to your [facility]. If you have not heard from [place] or cannot find your results in [facility] in 2 weeks please contact our office at [phone].  If you are not yet signed up for [facility], please consider signing up.   Preventive Care 40-64 Years, Female Preventive care refers to lifestyle choices and visits with your health care provider that can promote health and wellness. What does preventive care include?  A yearly physical exam. This is also called an annual well check.  Dental exams once or twice a year.  Routine eye exams. Ask your health care provider how often you should have your eyes checked.  Personal lifestyle choices, including: ? Daily care of your teeth and gums. ? Regular physical activity. ? Eating a healthy diet. ? Avoiding tobacco and drug use. ? Limiting alcohol use. ? Practicing safe sex. ? Taking low-dose aspirin daily starting at age 47. ? Taking vitamin and mineral supplements as recommended by your health care provider. What happens during an annual well check? The services and screenings done by  your health care provider during your annual well check will depend on your age, overall health, lifestyle risk factors, and family history of disease. Counseling Your health care provider may ask you questions about your:  Alcohol use.  Tobacco use.  Drug use.  Emotional well-being.  Home and relationship well-being.  Sexual activity.  Eating habits.  Work and work Statistician.  Method of birth control.  Menstrual cycle.  Pregnancy history.  Screening You may have the following tests or measurements:  Height, weight, and BMI.  Blood pressure.  Lipid and cholesterol levels. These may be checked every 5 years, or more frequently if you are over 40 years old.  Skin check.  Lung cancer screening. You may have this screening every year starting at age 60 if you have a 30-pack-year history of smoking and currently smoke or have quit within the past 15 years.  Fecal occult blood test (FOBT) of the stool. You may have this test every year starting at age 79.  Flexible sigmoidoscopy or colonoscopy. You may have a sigmoidoscopy every 5 years or a colonoscopy every 10 years starting at age 25.  Hepatitis C blood test.  Hepatitis B blood test.  Sexually transmitted disease (STD) testing.  Diabetes screening. This is done by checking your blood sugar (glucose) after you have not eaten for a while (fasting). You may have this done every 1-3 years.  Mammogram. This may be done every 1-2 years. Talk to your health care provider about when you should start having regular mammograms. This may depend on whether you have a family history of breast cancer.  BRCA-related cancer screening. This may be done if you have a family history of breast, ovarian, tubal, or peritoneal cancers.  Pelvic exam and Pap test. This may be done every 3 years starting at age 56. Starting at age 76, this may be done every 5 years if you have a Pap test in combination with an HPV test.  Bone density  scan. This is done to screen for osteoporosis. You may have this scan if you are at high risk for osteoporosis.  Discuss your test results, treatment options, and if necessary, the need for more tests with your health care provider. Vaccines Your health care provider may recommend certain vaccines, such as:  Influenza vaccine. This is recommended every year.  Tetanus, diphtheria, and acellular pertussis (Tdap, Td) vaccine. You may need a Td booster every 10 years.  Varicella vaccine. You may need this if you have not been vaccinated.  Zoster vaccine. You may need this after age 7.  Measles, mumps, and rubella (  MMR) vaccine. You may need at least one dose of MMR if you were born in 1957 or later. You may also need a second dose.  Pneumococcal 13-valent conjugate (PCV13) vaccine. You may need this if you have certain conditions and were not previously vaccinated.  Pneumococcal polysaccharide (PPSV23) vaccine. You may need one or two doses if you smoke cigarettes or if you have certain conditions.  Meningococcal vaccine. You may need this if you have certain conditions.  Hepatitis A vaccine. You may need this if you have certain conditions or if you travel or work in places where you may be exposed to hepatitis A.  Hepatitis B vaccine. You may need this if you have certain conditions or if you travel or work in places where you may be exposed to hepatitis B.  Haemophilus influenzae type b (Hib) vaccine. You may need this if you have certain conditions.  Talk to your health care provider about which screenings and vaccines you need and how often you need them. This information is not intended to replace advice given to you by your health care provider. Make sure you discuss any questions you have with your health care provider. Document Released: 01/03/2016 Document Revised: 08/26/2016 Document Reviewed: 10/08/2015 Elsevier Interactive Patient Education  2018 [employer].           No Follow-up on file.  Lucretia Kern, DO

## 2018-02-24 ENCOUNTER — Encounter: Payer: Self-pay | Admitting: Family Medicine

## 2018-02-24 ENCOUNTER — Ambulatory Visit (INDEPENDENT_AMBULATORY_CARE_PROVIDER_SITE_OTHER): Payer: BLUE CROSS/BLUE SHIELD | Admitting: Family Medicine

## 2018-02-24 VITALS — BP 100/78 | HR 92 | Ht 66.0 in | Wt 150.5 lb

## 2018-02-24 DIAGNOSIS — Z0000 Encounter for general adult medical examination without abnormal findings: Secondary | ICD-10-CM

## 2018-02-24 DIAGNOSIS — Z1331 Encounter for screening for depression: Secondary | ICD-10-CM

## 2018-02-24 DIAGNOSIS — R7989 Other specified abnormal findings of blood chemistry: Secondary | ICD-10-CM | POA: Diagnosis not present

## 2018-02-24 LAB — CBC WITH DIFFERENTIAL/PLATELET
BASOS PCT: 1.8 % (ref 0.0–3.0)
Basophils Absolute: 0.1 10*3/uL (ref 0.0–0.1)
Eosinophils Absolute: 0.7 10*3/uL (ref 0.0–0.7)
HEMATOCRIT: 40.2 % (ref 36.0–46.0)
HEMOGLOBIN: 13.5 g/dL (ref 12.0–15.0)
LYMPHS PCT: 33.8 % (ref 12.0–46.0)
Lymphs Abs: 2.3 10*3/uL (ref 0.7–4.0)
MCHC: 33.7 g/dL (ref 30.0–36.0)
MCV: 90.9 fl (ref 78.0–100.0)
MONOS PCT: 7.5 % (ref 3.0–12.0)
Monocytes Absolute: 0.5 10*3/uL (ref 0.1–1.0)
NEUTROS ABS: 3.2 10*3/uL (ref 1.4–7.7)
Neutrophils Relative %: 46.6 % (ref 43.0–77.0)
RBC: 4.43 Mil/uL (ref 3.87–5.11)
RDW: 15.3 % (ref 11.5–15.5)
WBC: 6.9 10*3/uL (ref 4.0–10.5)

## 2018-02-24 LAB — HEMOGLOBIN A1C: Hgb A1c MFr Bld: 5.6 % (ref 4.6–6.5)

## 2018-02-24 NOTE — Patient Instructions (Signed)
BEFORE YOU LEAVE: -Labs -follow up: Follow-up yearly and as needed  We have ordered labs or studies at this visit. It can take up to 1-2 weeks for results and processing. IF results require follow up or explanation, we will call you with instructions. Clinically stable results will be released to your [facility]. If you have not heard from [place] or cannot find your results in [facility] in 2 weeks please contact our office at [phone].  If you are not yet signed up for [facility], please consider signing up.   Preventive Care 40-64 Years, Female Preventive care refers to lifestyle choices and visits with your health care provider that can promote health and wellness. What does preventive care include?  A yearly physical exam. This is also called an annual well check.  Dental exams once or twice a year.  Routine eye exams. Ask your health care provider how often you should have your eyes checked.  Personal lifestyle choices, including: ? Daily care of your teeth and gums. ? Regular physical activity. ? Eating a healthy diet. ? Avoiding tobacco and drug use. ? Limiting alcohol use. ? Practicing safe sex. ? Taking low-dose aspirin daily starting at age 72. ? Taking vitamin and mineral supplements as recommended by your health care provider. What happens during an annual well check? The services and screenings done by your health care provider during your annual well check will depend on your age, overall health, lifestyle risk factors, and family history of disease. Counseling Your health care provider may ask you questions about your:  Alcohol use.  Tobacco use.  Drug use.  Emotional well-being.  Home and relationship well-being.  Sexual activity.  Eating habits.  Work and work Statistician.  Method of birth control.  Menstrual cycle.  Pregnancy history.  Screening You may have the following tests or measurements:  Height, weight, and BMI.  Blood pressure.  Lipid and  cholesterol levels. These may be checked every 5 years, or more frequently if you are over 24 years old.  Skin check.  Lung cancer screening. You may have this screening every year starting at age 28 if you have a 30-pack-year history of smoking and currently smoke or have quit within the past 15 years.  Fecal occult blood test (FOBT) of the stool. You may have this test every year starting at age 74.  Flexible sigmoidoscopy or colonoscopy. You may have a sigmoidoscopy every 5 years or a colonoscopy every 10 years starting at age 19.  Hepatitis C blood test.  Hepatitis B blood test.  Sexually transmitted disease (STD) testing.  Diabetes screening. This is done by checking your blood sugar (glucose) after you have not eaten for a while (fasting). You may have this done every 1-3 years.  Mammogram. This may be done every 1-2 years. Talk to your health care provider about when you should start having regular mammograms. This may depend on whether you have a family history of breast cancer.  BRCA-related cancer screening. This may be done if you have a family history of breast, ovarian, tubal, or peritoneal cancers.  Pelvic exam and Pap test. This may be done every 3 years starting at age 28. Starting at age 52, this may be done every 5 years if you have a Pap test in combination with an HPV test.  Bone density scan. This is done to screen for osteoporosis. You may have this scan if you are at high risk for osteoporosis.  Discuss your test results, treatment options, and  if necessary, the need for more tests with your health care provider. Vaccines Your health care provider may recommend certain vaccines, such as:  Influenza vaccine. This is recommended every year.  Tetanus, diphtheria, and acellular pertussis (Tdap, Td) vaccine. You may need a Td booster every 10 years.  Varicella vaccine. You may need this if you have not been vaccinated.  Zoster vaccine. You may need this after age  44.  Measles, mumps, and rubella (MMR) vaccine. You may need at least one dose of MMR if you were born in 1957 or later. You may also need a second dose.  Pneumococcal 13-valent conjugate (PCV13) vaccine. You may need this if you have certain conditions and were not previously vaccinated.  Pneumococcal polysaccharide (PPSV23) vaccine. You may need one or two doses if you smoke cigarettes or if you have certain conditions.  Meningococcal vaccine. You may need this if you have certain conditions.  Hepatitis A vaccine. You may need this if you have certain conditions or if you travel or work in places where you may be exposed to hepatitis A.  Hepatitis B vaccine. You may need this if you have certain conditions or if you travel or work in places where you may be exposed to hepatitis B.  Haemophilus influenzae type b (Hib) vaccine. You may need this if you have certain conditions.  Talk to your health care provider about which screenings and vaccines you need and how often you need them. This information is not intended to replace advice given to you by your health care provider. Make sure you discuss any questions you have with your health care provider. Document Released: 01/03/2016 Document Revised: 08/26/2016 Document Reviewed: 10/08/2015 Elsevier Interactive Patient Education  [employer].

## 2018-02-26 ENCOUNTER — Encounter: Payer: Self-pay | Admitting: Family Medicine

## 2018-02-28 NOTE — Addendum Note (Signed)
Addended by: Johnella MoloneyFUNDERBURK, JO A on: 02/28/2018 10:28 AM   Modules accepted: Orders

## 2018-03-03 NOTE — Addendum Note (Signed)
Addended by: Johnella MoloneyFUNDERBURK, Keziyah Kneale A on: 03/03/2018 10:10 AM   Modules accepted: Orders

## 2018-04-05 ENCOUNTER — Other Ambulatory Visit (INDEPENDENT_AMBULATORY_CARE_PROVIDER_SITE_OTHER): Payer: BLUE CROSS/BLUE SHIELD

## 2018-04-05 ENCOUNTER — Encounter: Payer: Self-pay | Admitting: Family Medicine

## 2018-04-05 DIAGNOSIS — R7989 Other specified abnormal findings of blood chemistry: Secondary | ICD-10-CM

## 2018-04-05 LAB — CBC WITH DIFFERENTIAL/PLATELET
BASOS PCT: 1.3 % (ref 0.0–3.0)
Basophils Absolute: 0.1 10*3/uL (ref 0.0–0.1)
EOS PCT: 10 % — AB (ref 0.0–5.0)
Eosinophils Absolute: 0.7 10*3/uL (ref 0.0–0.7)
HCT: 37 % (ref 36.0–46.0)
Hemoglobin: 12.6 g/dL (ref 12.0–15.0)
LYMPHS ABS: 2.6 10*3/uL (ref 0.7–4.0)
Lymphocytes Relative: 36.3 % (ref 12.0–46.0)
MCHC: 34 g/dL (ref 30.0–36.0)
MCV: 91.9 fl (ref 78.0–100.0)
MONOS PCT: 8 % (ref 3.0–12.0)
Monocytes Absolute: 0.6 10*3/uL (ref 0.1–1.0)
Neutro Abs: 3.2 10*3/uL (ref 1.4–7.7)
Neutrophils Relative %: 44.4 % (ref 43.0–77.0)
Platelets: 353 10*3/uL (ref 150.0–400.0)
RBC: 4.02 Mil/uL (ref 3.87–5.11)
RDW: 13.4 % (ref 11.5–15.5)
WBC: 7.1 10*3/uL (ref 4.0–10.5)

## 2019-01-30 ENCOUNTER — Telehealth: Payer: Self-pay | Admitting: Family Medicine

## 2019-01-30 NOTE — Telephone Encounter (Signed)
See note

## 2019-01-30 NOTE — Telephone Encounter (Signed)
Called pt and discussed the orthotic process.  Informed her that she can call her insurance company in advance to determine coverage or lack thereof for orthotics.  Pt scheduled for this Friday at 1 pm for orthotic fabrication.

## 2019-01-30 NOTE — Telephone Encounter (Signed)
Copied from CRM [id number]. Topic: Quick Communication - See Telephone Encounter >> Jan 30, 2019  9:43 AM Jens Som A wrote: CRM for notification. See Telephone encounter for: 01/30/19.  Patient is calling because she lost her orthotics and is requesting if Dr. Berline Chough can make her another pair Please advise [phone]

## 2019-02-03 ENCOUNTER — Ambulatory Visit: Payer: BLUE CROSS/BLUE SHIELD | Admitting: Sports Medicine

## 2019-02-28 NOTE — Progress Notes (Signed)
HPI:  Using dictation device. Unfortunately this device frequently misinterprets words/phrases.  Here for CPE: Sees gyn for gynecology exams and sees dermatology for skin exams. Due for flu vaccine, ? Lipids  -Concerns and/or follow up today:   Sees cardiologist for a hx of WPW. Sees urologist for a hx of nephrolithiasis Has L index finger IP jt pain intermittently and requests ortho referral for this. Reports has been icing and using tylenol and it is better, but she wants to see a hand specialist as it wakes her up at night sometimes. She also wonders about seeing an allergist. Has allergy symptoms when around dogs and horses. Has noticed a lot of emotional support dogs on airplanes and this is upsetting to her as the airlines don't know how many are on the plane. Take benadryl before flights. She wants to see allergist as has never been tested. -Diet: variety of foods, balance and well rounded, larger portion sizes -Exercise:  regular exercise -Taking folic acid, vitamin D or calcium: no -Diabetes and Dyslipidemia Screening: fasting and desires to screen lipids and for diabetes -Vaccines: see vaccine section EPIC -pap history:sees gyn -FDLMP: see nursing notes -wants STI testing (Hep C if born 58-65): no -FH breast, colon or ovarian ca: see FH Last mammogram: sees gyn for this Last colon cancer screening: last colonoscopy was 01/09/13 Breast Ca Risk Assessment: see family history and pt history DEXA (>/= 65): n/a  -Alcohol, Tobacco, drug use: see social history  Review of Systems - no fevers, unintentional weight loss, vision loss, hearing loss, chest pain, sob, hemoptysis, melena, hematochezia, hematuria, genital discharge, changing or concerning skin lesions, bleeding, bruising, loc, thoughts of self harm or SI  Past Medical History:  Diagnosis Date  . Arthritis    "knees" (05/19/2013)  . History of cardiac radiofrequency ablation 04/2013  . Hx of echocardiogram    Echo  (12/15):  EF 60-65%, no RWMA, Gr 1 DD  . SVT (supraventricular tachycardia) ([place])   . WPW (Wolff-Parkinson-White syndrome) ~ 1983   a. s/p ablation 05/20/13.    Past Surgical History:  Procedure Laterality Date  . ANAL FISSURE REPAIR  1980's  . BREAST BIOPSY Left 2000's  . EXCISION MORTON'S NEUROMA Left 05/04/2013   Procedure: LEFT SECOND MT  WEIL/SECOND WEBB SPACE NEUROMA EXCISION ;  Surgeon: Wylene Simmer, MD;  Location: [place];  Service: Orthopedics;  Laterality: Left;  . FOOT SURGERY Left 09/2014   hardware removal  . SUPRAVENTRICULAR TACHYCARDIA ABLATION  05/19/2013  . SUPRAVENTRICULAR TACHYCARDIA ABLATION N/A 05/19/2013   Procedure: SUPRAVENTRICULAR TACHYCARDIA ABLATION;  Surgeon: Evans Lance, MD;  Location: [facility] CATH LAB;  Service: Cardiovascular;  Laterality: N/A;    Family History  Problem Relation Age of Onset  . Diabetes Mother   . Depression Mother   . Melanoma Mother   . Heart attack Neg Hx     Social History   Socioeconomic History  . Marital status: Married    Spouse name: Elta Guadeloupe  . Number of children: 2  . Years of education: BS  . Highest education level: Not on file  Occupational History    Employer: [employer]    Comment: [employer]  . Financial resource strain: Not on file  . Food insecurity:    Worry: Not on file    Inability: Not on file  . Transportation needs:    Medical: Not on file    Non-medical: Not on file  Tobacco Use  . Smoking status: Never Smoker  .  Smokeless tobacco: Never Used  Substance and Sexual Activity  . Alcohol use: Yes    Alcohol/week: 4.0 standard drinks    Types: 4 Glasses of wine per week    Comment: 1-2 glasses occassionaly  . Drug use: No  . Sexual activity: Yes    Birth control/protection: Inserts  Lifestyle  . Physical activity:    Days per week: Not on file    Minutes per session: Not on file  . Stress: Not on file  Relationships  . Social connections:    Talks on phone: Not on file    Gets  together: Not on file    Attends religious service: Not on file    Active member of club or organization: Not on file    Attends meetings of clubs or organizations: Not on file    Relationship status: Not on file  Other Topics Concern  . Not on file  Social History Narrative   Patient is married and lives at home with her husband.     New job in 2017, Publishing rights manager   No regular exercise - diet is ok.     Current Outpatient Medications:  .  Calcium Carb-Cholecalciferol (CALCIUM + D3 PO), Take 600 mg by mouth 2 (two) times daily., Disp: , Rfl:  .  diphenhydrAMINE (BENADRYL) 25 MG tablet, Take 12.5-25 mg by mouth at bedtime as needed for sleep., Disp: , Rfl:  .  Multiple Vitamin (MULTIVITAMINS PO), Take by mouth., Disp: , Rfl:  .  OVER THE COUNTER MEDICATION, Shackley supplement for joints, Disp: , Rfl:  .  OVER THE COUNTER MEDICATION, Shackley supplement for vitamin C, Disp: , Rfl:   EXAM:  Vitals:   03/02/19 0659  BP: 90/60  Pulse: 85  Temp: 98.6 F (37 C)    GENERAL: vitals reviewed and listed below, alert, oriented, appears well hydrated and in no acute distress  HEENT: head atraumatic, PERRLA, normal appearance of eyes, ears, nose and mouth. moist mucus membranes.  NECK: supple, no masses or lymphadenopathy  LUNGS: clear to auscultation bilaterally, no rales, rhonchi or wheeze  CV: HRRR, no peripheral edema or cyanosis, normal pedal pulses  ABDOMEN: bowel sounds normal, soft, non tender to palpation, no masses, no rebound or guarding  GU/BREAST: declined, sees gyn  SKIN: no rash or abnormal lesions on exposed portions   MS: normal gait, moves all extremities normally  NEURO: normal gait, speech and thought processing grossly intact, muscle tone grossly intact throughout  PSYCH: normal affect, pleasant and cooperative  ASSESSMENT AND PLAN:  Discussed the following assessment and plan:  PREVENTIVE EXAM: -Discussed and advised all [place] preventive  services health task force level A and B recommendations for age, sex and risks. -Advised healthy lifestyle -labs, studies and vaccines per orders this encounter  Screening for hyperlipidemia - Plan: Lipid panel  Encounter for screening examination for intermediate hyperglycemia and diabetes mellitus - Plan: Hemoglobin A1c  Pain of finger of left hand - Plan: Ambulatory referral to Orthopedic Surgery  She plans to call the allergist for testing for pet allergies.    Patient advised to return to clinic immediately if symptoms worsen or persist or new concerns.  Patient Instructions  BEFORE YOU LEAVE: -labs -follow up: yearly for physical  -We placed a referral for you as discussed to the orthopedic specialist. It usually takes about 1-2 weeks to process and schedule this referral. If you have not heard from [place] regarding this appointment in 2 weeks please contact  our office.  We have ordered labs or studies at this visit. It can take up to 1-2 weeks for results and processing. IF results require follow up or explanation, we will call you with instructions. Clinically stable results will be released to your [facility]. If you have not heard from [place] or cannot find your results in [facility] in 2 weeks please contact our office at [phone].  If you are not yet signed up for [facility], please consider signing up.   Preventive Care 40-64 Years, Female Preventive care refers to lifestyle choices and visits with your health care provider that can promote health and wellness. What does preventive care include?   A yearly physical exam. This is also called an annual well check.  Dental exams once or twice a year.  Routine eye exams. Ask your health care provider how often you should have your eyes checked.  Personal lifestyle choices, including: ? Daily care of your teeth and gums. ? Regular physical activity. ? Eating a healthy diet. ? Avoiding tobacco and drug use. ? Limiting alcohol  use. ? Practicing safe sex. ? Taking vitamin and mineral supplements as recommended by your health care provider. What happens during an annual well check? The services and screenings done by your health care provider during your annual well check will depend on your age, overall health, lifestyle risk factors, and family history of disease. Counseling Your health care provider may ask you questions about your:  Alcohol use.  Tobacco use.  Drug use.  Emotional well-being.  Home and relationship well-being.  Sexual activity.  Eating habits.  Work and work Statistician.  Method of birth control.  Menstrual cycle.  Pregnancy history. Screening You may have the following tests or measurements:  Height, weight, and BMI.  Blood pressure.  Lipid and cholesterol levels. These may be checked every 5 years, or more frequently if you are over 46 years old.  Skin check.  Lung cancer screening. You may have this screening every year starting at age 5 if you have a 30-pack-year history of smoking and currently smoke or have quit within the past 15 years.  Colorectal cancer screening. All adults should have this screening starting at age 52 and continuing until age 60. Your health care provider may recommend screening at age 55. You will have tests every 1-10 years, depending on your results and the type of screening test. People at increased risk should start screening at an earlier age. Screening tests may include: ? Guaiac-based fecal occult blood testing. ? Fecal immunochemical test (FIT). ? Stool DNA test. ? Virtual colonoscopy. ? Sigmoidoscopy. During this test, a flexible tube with a tiny camera (sigmoidoscope) is used to examine your rectum and lower colon. The sigmoidoscope is inserted through your anus into your rectum and lower colon. ? Colonoscopy. During this test, a long, thin, flexible tube with a tiny camera (colonoscope) is used to examine your entire colon and  rectum.  Hepatitis C blood test.  Hepatitis B blood test.  Sexually transmitted disease (STD) testing.  Diabetes screening. This is done by checking your blood sugar (glucose) after you have not eaten for a while (fasting). You may have this done every 1-3 years.  Mammogram. This may be done every 1-2 years. Talk to your health care provider about when you should start having regular mammograms. This may depend on whether you have a family history of breast cancer.  BRCA-related cancer screening. This may be done if you have a family history of  breast, ovarian, tubal, or peritoneal cancers.  Pelvic exam and Pap test. This may be done every 3 years starting at age 21. Starting at age 13, this may be done every 5 years if you have a Pap test in combination with an HPV test.  Bone density scan. This is done to screen for osteoporosis. You may have this scan if you are at high risk for osteoporosis. Discuss your test results, treatment options, and if necessary, the need for more tests with your health care provider. Vaccines Your health care provider may recommend certain vaccines, such as:  Influenza vaccine. This is recommended every year.  Tetanus, diphtheria, and acellular pertussis (Tdap, Td) vaccine. You may need a Td booster every 10 years.  Varicella vaccine. You may need this if you have not been vaccinated.  Zoster vaccine. You may need this after age 28.  Measles, mumps, and rubella (MMR) vaccine. You may need at least one dose of MMR if you were born in 1957 or later. You may also need a second dose.  Pneumococcal 13-valent conjugate (PCV13) vaccine. You may need this if you have certain conditions and were not previously vaccinated.  Pneumococcal polysaccharide (PPSV23) vaccine. You may need one or two doses if you smoke cigarettes or if you have certain conditions.  Meningococcal vaccine. You may need this if you have certain conditions.  Hepatitis A vaccine. You may  need this if you have certain conditions or if you travel or work in places where you may be exposed to hepatitis A.  Hepatitis B vaccine. You may need this if you have certain conditions or if you travel or work in places where you may be exposed to hepatitis B.  Haemophilus influenzae type b (Hib) vaccine. You may need this if you have certain conditions. Talk to your health care provider about which screenings and vaccines you need and how often you need them. This information is not intended to replace advice given to you by your health care provider. Make sure you discuss any questions you have with your health care provider. Document Released: 01/03/2016 Document Revised: 01/27/2018 Document Reviewed: 10/08/2015 Elsevier Interactive Patient Education  2019 [employer].           No follow-ups on file.  Lucretia Kern, DO

## 2019-03-02 ENCOUNTER — Ambulatory Visit (INDEPENDENT_AMBULATORY_CARE_PROVIDER_SITE_OTHER): Payer: 59 | Admitting: Family Medicine

## 2019-03-02 ENCOUNTER — Other Ambulatory Visit: Payer: Self-pay

## 2019-03-02 ENCOUNTER — Encounter: Payer: Self-pay | Admitting: Family Medicine

## 2019-03-02 VITALS — BP 90/60 | HR 85 | Temp 98.6°F | Ht 65.0 in | Wt 157.3 lb

## 2019-03-02 DIAGNOSIS — Z131 Encounter for screening for diabetes mellitus: Secondary | ICD-10-CM

## 2019-03-02 DIAGNOSIS — M79645 Pain in left finger(s): Secondary | ICD-10-CM | POA: Diagnosis not present

## 2019-03-02 DIAGNOSIS — Z13220 Encounter for screening for lipoid disorders: Secondary | ICD-10-CM | POA: Diagnosis not present

## 2019-03-02 DIAGNOSIS — Z0000 Encounter for general adult medical examination without abnormal findings: Secondary | ICD-10-CM

## 2019-03-02 LAB — LIPID PANEL
CHOLESTEROL: 161 mg/dL (ref 0–200)
HDL: 56 mg/dL (ref >39.00)
LDL CALC: 87 mg/dL (ref 0–99)
NonHDL: 105.39
TRIGLYCERIDES: 92 mg/dL (ref 0.0–149.0)
Total CHOL/HDL Ratio: 3
VLDL: 18.4 mg/dL (ref 0.0–40.0)

## 2019-03-02 LAB — HEMOGLOBIN A1C: Hgb A1c MFr Bld: 5.5 % (ref 4.6–6.5)

## 2019-03-02 NOTE — Patient Instructions (Signed)
BEFORE YOU LEAVE: -labs -follow up: yearly for physical  -We placed a referral for you as discussed to the orthopedic specialist. It usually takes about 1-2 weeks to process and schedule this referral. If you have not heard from [place] regarding this appointment in 2 weeks please contact our office.  We have ordered labs or studies at this visit. It can take up to 1-2 weeks for results and processing. IF results require follow up or explanation, we will call you with instructions. Clinically stable results will be released to your [facility]. If you have not heard from [place] or cannot find your results in [facility] in 2 weeks please contact our office at [phone].  If you are not yet signed up for [facility], please consider signing up.   Preventive Care 40-64 Years, Female Preventive care refers to lifestyle choices and visits with your health care provider that can promote health and wellness. What does preventive care include?   A yearly physical exam. This is also called an annual well check.  Dental exams once or twice a year.  Routine eye exams. Ask your health care provider how often you should have your eyes checked.  Personal lifestyle choices, including: ? Daily care of your teeth and gums. ? Regular physical activity. ? Eating a healthy diet. ? Avoiding tobacco and drug use. ? Limiting alcohol use. ? Practicing safe sex. ? Taking vitamin and mineral supplements as recommended by your health care provider. What happens during an annual well check? The services and screenings done by your health care provider during your annual well check will depend on your age, overall health, lifestyle risk factors, and family history of disease. Counseling Your health care provider may ask you questions about your:  Alcohol use.  Tobacco use.  Drug use.  Emotional well-being.  Home and relationship well-being.  Sexual activity.  Eating habits.  Work and work Statistician.  Method of  birth control.  Menstrual cycle.  Pregnancy history. Screening You may have the following tests or measurements:  Height, weight, and BMI.  Blood pressure.  Lipid and cholesterol levels. These may be checked every 5 years, or more frequently if you are over 40 years old.  Skin check.  Lung cancer screening. You may have this screening every year starting at age 67 if you have a 30-pack-year history of smoking and currently smoke or have quit within the past 15 years.  Colorectal cancer screening. All adults should have this screening starting at age 46 and continuing until age 14. Your health care provider may recommend screening at age 63. You will have tests every 1-10 years, depending on your results and the type of screening test. People at increased risk should start screening at an earlier age. Screening tests may include: ? Guaiac-based fecal occult blood testing. ? Fecal immunochemical test (FIT). ? Stool DNA test. ? Virtual colonoscopy. ? Sigmoidoscopy. During this test, a flexible tube with a tiny camera (sigmoidoscope) is used to examine your rectum and lower colon. The sigmoidoscope is inserted through your anus into your rectum and lower colon. ? Colonoscopy. During this test, a long, thin, flexible tube with a tiny camera (colonoscope) is used to examine your entire colon and rectum.  Hepatitis C blood test.  Hepatitis B blood test.  Sexually transmitted disease (STD) testing.  Diabetes screening. This is done by checking your blood sugar (glucose) after you have not eaten for a while (fasting). You may have this done every 1-3 years.  Mammogram. This  may be done every 1-2 years. Talk to your health care provider about when you should start having regular mammograms. This may depend on whether you have a family history of breast cancer.  BRCA-related cancer screening. This may be done if you have a family history of breast, ovarian, tubal, or peritoneal cancers.   Pelvic exam and Pap test. This may be done every 3 years starting at age 66. Starting at age 11, this may be done every 5 years if you have a Pap test in combination with an HPV test.  Bone density scan. This is done to screen for osteoporosis. You may have this scan if you are at high risk for osteoporosis. Discuss your test results, treatment options, and if necessary, the need for more tests with your health care provider. Vaccines Your health care provider may recommend certain vaccines, such as:  Influenza vaccine. This is recommended every year.  Tetanus, diphtheria, and acellular pertussis (Tdap, Td) vaccine. You may need a Td booster every 10 years.  Varicella vaccine. You may need this if you have not been vaccinated.  Zoster vaccine. You may need this after age 83.  Measles, mumps, and rubella (MMR) vaccine. You may need at least one dose of MMR if you were born in 1957 or later. You may also need a second dose.  Pneumococcal 13-valent conjugate (PCV13) vaccine. You may need this if you have certain conditions and were not previously vaccinated.  Pneumococcal polysaccharide (PPSV23) vaccine. You may need one or two doses if you smoke cigarettes or if you have certain conditions.  Meningococcal vaccine. You may need this if you have certain conditions.  Hepatitis A vaccine. You may need this if you have certain conditions or if you travel or work in places where you may be exposed to hepatitis A.  Hepatitis B vaccine. You may need this if you have certain conditions or if you travel or work in places where you may be exposed to hepatitis B.  Haemophilus influenzae type b (Hib) vaccine. You may need this if you have certain conditions. Talk to your health care provider about which screenings and vaccines you need and how often you need them. This information is not intended to replace advice given to you by your health care provider. Make sure you discuss any questions you have  with your health care provider. Document Released: 01/03/2016 Document Revised: 01/27/2018 Document Reviewed: 10/08/2015 Elsevier Interactive Patient Education  2019 [employer].

## 2019-09-06 ENCOUNTER — Telehealth: Payer: Self-pay

## 2019-09-06 NOTE — Telephone Encounter (Signed)
Please see message. Please advise.  Copied from Sam [place]burn [phone]. Topic: General - Other >> Sep 06, 2019  1:21 PM Pauline Good wrote: Reason for CRM: pt stated her husband said Dr Elease Hashimoto would take his family on as new pt's. Pt stated her husband said Dr Elease Hashimoto would get with nurse to sched her and their son and appt. Please call to advise

## 2019-09-06 NOTE — Telephone Encounter (Signed)
OK 

## 2019-09-08 NOTE — Telephone Encounter (Signed)
Can someone proceed with new patient procedure for this patient and her son to see Dr. Elease Hashimoto? Thank you!!

## 2019-09-19 NOTE — Telephone Encounter (Signed)
Patient has a TOC appointment with Dr. Elease Hashimoto on 09/22/2019.

## 2019-09-22 ENCOUNTER — Other Ambulatory Visit: Payer: Self-pay

## 2019-09-22 ENCOUNTER — Ambulatory Visit (INDEPENDENT_AMBULATORY_CARE_PROVIDER_SITE_OTHER): Payer: 59 | Admitting: Family Medicine

## 2019-09-22 DIAGNOSIS — Z20822 Contact with and (suspected) exposure to covid-19: Secondary | ICD-10-CM

## 2019-09-22 DIAGNOSIS — R0981 Nasal congestion: Secondary | ICD-10-CM

## 2019-09-22 NOTE — Progress Notes (Signed)
Patient ID: Laura Wheeler, female   DOB: Mar 02, 1961, 58 y.o.   MRN: [id number]  This visit type was conducted due to national recommendations for restrictions regarding the COVID-19 pandemic in an effort to limit this patient's exposure and mitigate transmission in our community.   Virtual Visit via Video Note  I connected with Laura Wheeler on 09/22/19 at 11:30 AM EDT by a video enabled telemedicine application and verified that I am speaking with the correct person using two identifiers.  Location patient: home Location provider:work or home office Persons participating in the virtual visit: patient, provider  I discussed the limitations of evaluation and management by telemedicine and the availability of in person appointments. The patient expressed understanding and agreed to proceed.   HPI: Laura Wheeler is generally healthy.  She and her husband just got back from [place].  They were out there 10 days and stayed in 5 different hotels.  They were visiting their son and did lots of motorcycling..  She developed some nasal congestion and wonders whether she should be screened and tested for COVID.  She has not had any loss of taste or smell.  No fever.  No chills.  No diarrhea.  No cough.  No dyspnea.  She has past history of Wolff-Parkinson-White syndrome and has had previous ablation.  Past history of kidney stones.  Takes no regular medications.  No chronic heart or lung problems otherwise.   ROS: See pertinent positives and negatives per HPI.  Past Medical History:  Diagnosis Date  . Arthritis    "knees" (05/19/2013)  . History of cardiac radiofrequency ablation 04/2013  . Hx of echocardiogram    Echo (12/15):  EF 60-65%, no RWMA, Gr 1 DD  . SVT (supraventricular tachycardia) ([place])   . WPW (Wolff-Parkinson-White syndrome) ~ 1983   a. s/p ablation 05/20/13.    Past Surgical History:  Procedure Laterality Date  . ANAL FISSURE REPAIR  1980's  . BREAST BIOPSY Left 2000's  .  EXCISION MORTON'S NEUROMA Left 05/04/2013   Procedure: LEFT SECOND MT  WEIL/SECOND WEBB SPACE NEUROMA EXCISION ;  Surgeon: Wylene Simmer, MD;  Location: [place];  Service: Orthopedics;  Laterality: Left;  . FOOT SURGERY Left 09/2014   hardware removal  . SUPRAVENTRICULAR TACHYCARDIA ABLATION  05/19/2013  . SUPRAVENTRICULAR TACHYCARDIA ABLATION N/A 05/19/2013   Procedure: SUPRAVENTRICULAR TACHYCARDIA ABLATION;  Surgeon: Evans Lance, MD;  Location: [facility] CATH LAB;  Service: Cardiovascular;  Laterality: N/A;    Family History  Problem Relation Age of Onset  . Diabetes Mother   . Depression Mother   . Melanoma Mother   . Heart attack Neg Hx     SOCIAL HX: Non-smoker   Current Outpatient Medications:  .  Calcium Carb-Cholecalciferol (CALCIUM + D3 PO), Take 600 mg by mouth 2 (two) times daily., Disp: , Rfl:  .  Multiple Vitamin (MULTIVITAMINS PO), Take by mouth., Disp: , Rfl:  .  OVER THE COUNTER MEDICATION, Shackley supplement for joints, Disp: , Rfl:  .  OVER THE COUNTER MEDICATION, Shackley supplement for vitamin C, Disp: , Rfl:   EXAM:  VITALS per patient if applicable:  GENERAL: alert, oriented, appears well and in no acute distress  HEENT: atraumatic, conjunttiva clear, no obvious abnormalities on inspection of external nose and ears  NECK: normal movements of the head and neck  LUNGS: on inspection no signs of respiratory distress, breathing rate appears normal, no obvious gross SOB, gasping or wheezing  CV: no obvious cyanosis  MS: moves  all visible extremities without noticeable abnormality  PSYCH/NEURO: pleasant and cooperative, no obvious depression or anxiety, speech and thought processing grossly intact  ASSESSMENT AND PLAN:  Discussed the following assessment and plan:  Nasal congestion - Plan: Novel Coronavirus, NAA ([facility])  -Patient does not have any red flags such as dyspnea, cough, fever but did have recent travels as above.  She is particularly concerned about  ruling out COVID-19 infection because her husband has had some heart issues    I discussed the assessment and treatment plan with the patient. The patient was provided an opportunity to ask questions and all were answered. The patient agreed with the plan and demonstrated an understanding of the instructions.   The patient was advised to call back or seek an in-person evaluation if the symptoms worsen or if the condition fails to improve as anticipated.   Evelena Peat, MD

## 2019-09-23 LAB — NOVEL CORONAVIRUS, NAA: SARS-CoV-2, NAA: NOT DETECTED

## 2019-09-24 ENCOUNTER — Encounter: Payer: Self-pay | Admitting: Family Medicine

## 2019-10-17 ENCOUNTER — Ambulatory Visit (INDEPENDENT_AMBULATORY_CARE_PROVIDER_SITE_OTHER): Payer: 59 | Admitting: Family Medicine

## 2019-10-17 ENCOUNTER — Encounter: Payer: Self-pay | Admitting: Family Medicine

## 2019-10-17 ENCOUNTER — Other Ambulatory Visit: Payer: Self-pay

## 2019-10-17 VITALS — BP 140/66 | HR 79 | Temp 97.9°F | Wt 165.0 lb

## 2019-10-17 DIAGNOSIS — M25562 Pain in left knee: Secondary | ICD-10-CM

## 2019-10-17 MED ORDER — DICLOFENAC SODIUM 1 % TD GEL: 4.0000 g | Freq: Four times a day (QID) | TRANSDERMAL | 11 refills | Status: DC

## 2019-10-17 NOTE — Patient Instructions (Signed)
Thank you for coming in today. Use diclofenac gel up to 4x daily for pain.  Call or go to the ER if you develop a large red swollen joint with extreme pain or oozing puss.  Let me know if not better.    Meniscus Tear  A meniscus tear is a knee injury that happens when a piece of the meniscus is torn. The meniscus is a thick, rubbery, wedge-shaped cartilage in the knee. Two menisci are located in each knee. They sit between the upper bone (femur) and lower bone (tibia) that make up the knee joint. Each meniscus acts as a shock absorber for the knee. A torn meniscus is one of the most common types of knee injuries. This injury can range from mild to severe. Surgery may be needed to repair a severe tear. What are the causes? This condition may be caused by any kneeling, squatting, twisting, or pivoting movement. Sports-related injuries are the most common cause. These often occur from:  Running and stopping suddenly. ? Changing direction. ? Being tackled or knocked off your feet.  Lifting or carrying heavy weights. As people get older, their menisci get thinner and weaker. In these people, tears can happen more easily, such as from climbing stairs. What increases the risk? You are more likely to develop this condition if you:  Play contact sports.  Have a job that requires kneeling or squatting.  Are female.  Are over 87 years old. What are the signs or symptoms? Symptoms of this condition include:  Knee pain, especially at the side of the knee joint. You may feel pain when the injury occurs, or you may only hear a pop and feel pain later.  A feeling that your knee is clicking, catching, locking, or giving way.  Not being able to fully bend or extend your knee.  Bruising or swelling in your knee. How is this diagnosed? This condition may be diagnosed based on your symptoms and a physical exam. You may also have tests, such as:  X-rays.  MRI.  A procedure to look inside your  knee with a narrow surgical telescope (arthroscopy). You may be referred to a knee specialist (orthopedic surgeon). How is this treated? Treatment for this injury depends on the severity of the tear. Treatment for a mild tear may include:  Rest.  Medicine to reduce pain and swelling. This is usually a nonsteroidal anti-inflammatory drug (NSAID), like ibuprofen.  A knee brace, sleeve, or wrap.  Using crutches or a walker to keep weight off your knee and to help you walk.  Exercises to strengthen your knee (physical therapy). You may need surgery if you have a severe tear or if other treatments are not working. Follow these instructions at home: If you have a brace, sleeve, or wrap:  Wear it as told by your health care provider. Remove it only as told by your health care provider.  Loosen the brace, sleeve, or wrap if your toes tingle, become numb, or turn cold and blue.  Keep the brace, sleeve, or wrap clean and dry.  If the brace, sleeve, or wrap is not waterproof: ? Do not let it get wet. ? Cover it with a watertight covering when you take a bath or shower. Managing pain and swelling   Take over-the-counter and prescription medicines only as told by your health care provider.  If directed, put ice on your knee: ? If you have a removable brace, sleeve, or wrap, remove it as told by your  health care provider. ? Put ice in a plastic bag. ? Place a towel between your skin and the bag. ? Leave the ice on for 20 minutes, 2-3 times per day.  Move your toes often to avoid stiffness and to lessen swelling.  Raise (elevate) the injured area above the level of your heart while you are sitting or lying down. Activity  Do not use the injured limb to support your body weight until your health care provider says that you can. Use crutches or a walker as told by your health care provider.  Return to your normal activities as told by your health care provider. Ask your health care  provider what activities are safe for you.  Perform range-of-motion exercises only as told by your health care provider.  Begin doing exercises to strengthen your knee and leg muscles only as told by your health care provider. After you recover, your health care provider may recommend these exercises to help prevent another injury. General instructions  Use a knee brace, sleeve, or wrap as told by your health care provider.  Ask your health care provider when it is safe to drive if you have a brace, sleeve, or wrap on your knee.  Do not use any products that contain nicotine or tobacco, such as cigarettes, e-cigarettes, and chewing tobacco. If you need help quitting, ask your health care provider.  Ask your health care provider if the medicine prescribed to you: ? Requires you to avoid driving or using heavy machinery. ? Can cause constipation. You may need to take these actions to prevent or treat constipation:  Drink enough fluid to keep your urine pale yellow.  Take over-the-counter or prescription medicines.  Eat foods that are high in fiber, such as beans, whole grains, and fresh fruits and vegetables.  Limit foods that are high in fat and processed sugars, such as fried or sweet foods.  Keep all follow-up visits as told by your health care provider. This is important. Contact a health care provider if:  You have a fever.  Your knee becomes red, tender, or swollen.  Your pain medicine is not helping.  Your symptoms get worse or do not improve after 2 weeks of home care. Summary  A meniscus tear is a knee injury that happens when a piece of the meniscus is torn.  Treatment for this injury depends on the severity of the tear. You may need surgery if you have a severe tear or if other treatments are not working.  Rest, ice, and raise (elevate) your injured knee as told by your health care provider. This will help lessen pain and swelling.  Contact a health care provider  if you have new symptoms, or your symptoms get worse or do not improve after 2 weeks of home care.  Keep all follow-up visits as told by your health care provider. This is important. This information is not intended to replace advice given to you by your health care provider. Make sure you discuss any questions you have with your health care provider. Document Released: 02/27/2003 Document Revised: 06/21/2018 Document Reviewed: 06/21/2018 Elsevier Patient Education  2020 [employer].

## 2019-10-17 NOTE — Progress Notes (Signed)
Subjective:    NEW PATIENT  CC: Left knee pain  HPI: Laura Wheeler developed left knee pain starting Sunday, October 24.  She was getting out of bed and felt her left knee twisted little.  She developed some swelling.  She notes some catching and giving way sensation.  She notes her knee continues to bother her off and on.  Typically the pain is worse with activity and better with rest.  She tried Tylenol and ibuprofen as well as topical CBD which helps a little.    She notes a history of multiple left toe surgeries and problems with residual stiffness that does affect her gait occasionally.  She exercises regular doing Pilates and yoga and has had to modify her activity a little bit because of the knee stiffness and pain.   Past medical history, Surgical history, Family history not pertinant except as noted below, Social history, Allergies, and medications have been entered into the medical record, reviewed, and no changes needed.   Review of Systems: No headache, visual changes, nausea, vomiting, diarrhea, constipation, dizziness, abdominal pain, skin rash, fevers, chills, night sweats, weight loss, swollen lymph nodes, body aches, joint swelling, muscle aches, chest pain, shortness of breath, mood changes, visual or auditory hallucinations.   Objective:    Vitals:   10/17/19 1338  BP: 140/66  Pulse: 79  Temp: 97.9 F (36.6 C)   General: Well Developed, well nourished, and in no acute distress.  Neuro/Psych: Alert and oriented x3, extra-ocular muscles intact, able to move all 4 extremities, sensation grossly intact. Skin: Warm and dry, no rashes noted.  Respiratory: Not using accessory muscles, speaking in full sentences, trachea midline.  Cardiovascular: Pulses palpable, no extremity edema. Abdomen: Does not appear distended. MSK:  Left knee mild effusion otherwise normal-appearing Range of motion 0-120 degrees with retropatellar crepitations. Nontender. Stable ligaments exam with  no laxity or pain. Negative Murray's test. Positive Thessaly's test. Intact flexion extension strength.   Lab and Radiology Results Procedure: Real-time Ultrasound Guided Injection of left knee Device: GE Logiq E   Images permanently stored and available for review in the ultrasound unit. Verbal informed consent obtained.  Discussed risks and benefits of procedure. Warned about infection bleeding damage to structures skin hypopigmentation and fat atrophy among others. Patient expresses understanding and agreement Time-out conducted.   Noted no overlying erythema, induration, or other signs of local infection.   Skin prepped in a sterile fashion.   Local anesthesia: Topical Ethyl chloride.   With sterile technique and under real time ultrasound guidance:  80 mg of Kenalog and 3 mL of Marcaine injected easily.   Completed without difficulty   Pain immediately resolved suggesting accurate placement of the medication.   Advised to call if fevers/chills, erythema, induration, drainage, or persistent bleeding.   Images permanently stored and available for review in the ultrasound unit.  Impression: Technically successful ultrasound guided injection.       Impression and Recommendations:    Assessment and Plan: 58 y.o. female with left knee pain.  Likely exacerbation of DJD versus degenerative meniscus tear.  Discussed options.  Plan for trial of diclofenac gel relative rest and steroid injection.  If not improving patient will return for recheck reevaluation.  Likely proceed with x-ray at that time with possible MRI in the future.  Discussed precautions recheck as needed.Marland Kitchen  PDMP not reviewed this encounter. No orders of the defined types were placed in this encounter.  Meds ordered this encounter  Medications  . diclofenac  sodium (VOLTAREN) 1 % GEL    Sig: Apply 4 g topically 4 (four) times daily. To affected joint.    Dispense:  100 g    Refill:  11    Discussed warning signs  or symptoms. Please see discharge instructions. Patient expresses understanding.

## 2019-10-18 ENCOUNTER — Encounter: Payer: Self-pay | Admitting: Family Medicine

## 2019-10-20 ENCOUNTER — Encounter: Payer: Self-pay | Admitting: Family Medicine

## 2019-10-20 DIAGNOSIS — M25562 Pain in left knee: Secondary | ICD-10-CM

## 2019-10-20 DIAGNOSIS — G8929 Other chronic pain: Secondary | ICD-10-CM

## 2019-10-20 DIAGNOSIS — M1712 Unilateral primary osteoarthritis, left knee: Secondary | ICD-10-CM | POA: Insufficient documentation

## 2019-10-20 NOTE — Assessment & Plan Note (Signed)
Left knee pain, persistent in spite of greater than 6 weeks of conservative measures including injection by Dr. Georgina Snell. He suspected meniscal tear, adding an MRI of the knee, she did not improve post injection.

## 2019-10-20 NOTE — Telephone Encounter (Signed)
MRI ordered, she will get a call for scheduling.

## 2019-10-23 ENCOUNTER — Other Ambulatory Visit: Payer: Self-pay | Admitting: Obstetrics and Gynecology

## 2019-10-23 DIAGNOSIS — Z803 Family history of malignant neoplasm of breast: Secondary | ICD-10-CM

## 2019-10-24 ENCOUNTER — Telehealth: Payer: 59 | Admitting: Physician Assistant

## 2019-10-24 DIAGNOSIS — J329 Chronic sinusitis, unspecified: Secondary | ICD-10-CM | POA: Diagnosis not present

## 2019-10-24 MED ORDER — AZITHROMYCIN 250 MG PO TABS: ORAL_TABLET | ORAL | 0 refills | Status: DC

## 2019-10-24 NOTE — Progress Notes (Signed)
E-visit 31min.  Patient is a 58 year old female who has been having problems with her sinuses.  This is been going on for the last 4 days.  She is having some pain around the nose and face, as well as nasal congestion and low-grade fever.  She has yellowish to yellow-green mucus present.  She has been trying a Nettie pot and ibuprofen, but having only minimal relief from this.  There is been no reported bloody mucus.  Is been no reported difficulty with breathing.  He has been low-grade fever, but no high fever reported.  The patient is to continue with the Nettie pot.  Continue the ibuprofen for discomfort.  I have suggested the use of Mucinex or Mucinex D, as well as ordered a prescription for Zithromax.

## 2019-10-30 ENCOUNTER — Other Ambulatory Visit (HOSPITAL_BASED_OUTPATIENT_CLINIC_OR_DEPARTMENT_OTHER): Payer: 59

## 2019-10-30 ENCOUNTER — Ambulatory Visit (INDEPENDENT_AMBULATORY_CARE_PROVIDER_SITE_OTHER): Payer: 59

## 2019-10-30 ENCOUNTER — Other Ambulatory Visit: Payer: Self-pay

## 2019-10-30 DIAGNOSIS — M25562 Pain in left knee: Secondary | ICD-10-CM | POA: Diagnosis not present

## 2019-10-30 DIAGNOSIS — G8929 Other chronic pain: Secondary | ICD-10-CM

## 2019-11-07 ENCOUNTER — Ambulatory Visit: Payer: 59

## 2020-03-18 ENCOUNTER — Ambulatory Visit: Payer: 59

## 2020-03-18 ENCOUNTER — Encounter: Payer: 59 | Admitting: Family Medicine

## 2020-03-19 ENCOUNTER — Encounter: Payer: Self-pay | Admitting: Family Medicine

## 2020-03-19 ENCOUNTER — Ambulatory Visit (INDEPENDENT_AMBULATORY_CARE_PROVIDER_SITE_OTHER): Payer: 59 | Admitting: Family Medicine

## 2020-03-19 ENCOUNTER — Other Ambulatory Visit: Payer: Self-pay

## 2020-03-19 VITALS — BP 116/62 | HR 96 | Temp 97.7°F | Ht 65.0 in | Wt 158.0 lb

## 2020-03-19 DIAGNOSIS — Z0000 Encounter for general adult medical examination without abnormal findings: Secondary | ICD-10-CM | POA: Diagnosis not present

## 2020-03-19 LAB — CBC WITH DIFFERENTIAL/PLATELET
Basophils Absolute: 0.1 10*3/uL (ref 0.0–0.1)
Basophils Relative: 1.1 % (ref 0.0–3.0)
Eosinophils Absolute: 0.2 10*3/uL (ref 0.0–0.7)
Eosinophils Relative: 2.5 % (ref 0.0–5.0)
HCT: 40.1 % (ref 36.0–46.0)
Hemoglobin: 13.6 g/dL (ref 12.0–15.0)
Lymphocytes Relative: 39 % (ref 12.0–46.0)
Lymphs Abs: 2.7 10*3/uL (ref 0.7–4.0)
MCHC: 33.9 g/dL (ref 30.0–36.0)
MCV: 92.9 fl (ref 78.0–100.0)
Monocytes Absolute: 0.6 10*3/uL (ref 0.1–1.0)
Monocytes Relative: 8.3 % (ref 3.0–12.0)
Neutro Abs: 3.4 10*3/uL (ref 1.4–7.7)
Neutrophils Relative %: 49.1 % (ref 43.0–77.0)
Platelets: 374 10*3/uL (ref 150.0–400.0)
RBC: 4.32 Mil/uL (ref 3.87–5.11)
RDW: 13.4 % (ref 11.5–15.5)
WBC: 7 10*3/uL (ref 4.0–10.5)

## 2020-03-19 LAB — LIPID PANEL
Cholesterol: 167 mg/dL (ref 0–200)
HDL: 55.1 mg/dL (ref >39.00)
LDL Cholesterol: 90 mg/dL (ref 0–99)
NonHDL: 112.12
Total CHOL/HDL Ratio: 3
Triglycerides: 110 mg/dL (ref 0.0–149.0)
VLDL: 22 mg/dL (ref 0.0–40.0)

## 2020-03-19 LAB — BASIC METABOLIC PANEL
BUN: 15 mg/dL (ref 6–23)
CO2: 29 mEq/L (ref 19–32)
Calcium: 10 mg/dL (ref 8.4–10.5)
Chloride: 100 mEq/L (ref 96–112)
Creatinine, Ser: 0.67 mg/dL (ref 0.40–1.20)
GFR: 90.19 mL/min (ref >60.00)
Glucose, Bld: 87 mg/dL (ref 70–99)
Potassium: 4.8 mEq/L (ref 3.5–5.1)
Sodium: 136 mEq/L (ref 135–145)

## 2020-03-19 LAB — HEPATIC FUNCTION PANEL
ALT: 20 U/L (ref 0–35)
AST: 20 U/L (ref 0–37)
Albumin: 4.5 g/dL (ref 3.5–5.2)
Alkaline Phosphatase: 73 U/L (ref 39–117)
Bilirubin, Direct: 0.1 mg/dL (ref 0.0–0.3)
Total Bilirubin: 0.6 mg/dL (ref 0.2–1.2)
Total Protein: 7.2 g/dL (ref 6.0–8.3)

## 2020-03-19 LAB — TSH: TSH: 1.82 u[IU]/mL (ref 0.35–4.50)

## 2020-03-19 NOTE — Patient Instructions (Signed)

## 2020-03-19 NOTE — Progress Notes (Signed)
Subjective:     Patient ID: Laura Wheeler, female   DOB: 03-Aug-1961, 59 y.o.   MRN: [id number]  HPI   Laura Wheeler is here for physical exam.  She sees gynecologist yearly.  She has no significant chronic problems other than osteoarthritis involving several joints.  She takes no regular medications.  She takes a few supplements.  Family history is reviewed and unchanged.  Her mom has type 2 diabetes and history of hypertension ans  hyperlipidemia.  Father has hypothyroidism and hypertension history.  Laura Wheeler sees gynecologist yearly and her mammogram and Pap smears are up-to-date.  She has had previous hepatitis C screening negative.  She just had her first Covid vaccine.  She has had previous Shingrix.  Colonoscopy due 2024.  Past Medical History:  Diagnosis Date  . Arthritis    "knees" (05/19/2013)  . History of cardiac radiofrequency ablation 04/2013  . Hx of echocardiogram    Echo (12/15):  EF 60-65%, no RWMA, Gr 1 DD  . SVT (supraventricular tachycardia) (HCC)   . WPW (Wolff-Parkinson-White syndrome) ~ 1983   a. s/p ablation 05/20/13.   Past Surgical History:  Procedure Laterality Date  . ANAL FISSURE REPAIR  1980's  . BREAST BIOPSY Left 2000's  . EXCISION MORTON'S NEUROMA Left 05/04/2013   Procedure: LEFT SECOND MT  WEIL/SECOND WEBB SPACE NEUROMA EXCISION ;  Surgeon: [name], MD;  Location: [facility] OR;  Service: Orthopedics;  Laterality: Left;  . FOOT SURGERY Left 09/2014   hardware removal  . SUPRAVENTRICULAR TACHYCARDIA ABLATION  05/19/2013  . SUPRAVENTRICULAR TACHYCARDIA ABLATION N/A 05/19/2013   Procedure: SUPRAVENTRICULAR TACHYCARDIA ABLATION;  Surgeon: Marinus Maw, MD;  Location: [facility] CATH LAB;  Service: Cardiovascular;  Laterality: N/A;    reports that she has never smoked. She has never used smokeless tobacco. She reports current alcohol use of about 4.0 standard drinks of alcohol per week. She reports that she does not use drugs. family history includes Depression in her  mother; Diabetes in her mother; Melanoma in her mother. Allergies  Allergen Reactions  . Sulfa Antibiotics Itching    Doesn't remember reaction - 20 years ago     Review of Systems  Constitutional: Negative for activity change, appetite change, fatigue, fever and unexpected weight change.  HENT: Negative for ear pain, hearing loss, sore throat and trouble swallowing.   Eyes: Negative for visual disturbance.  Respiratory: Negative for cough and shortness of breath.   Cardiovascular: Negative for chest pain and palpitations.  Gastrointestinal: Negative for abdominal pain, blood in stool, constipation and diarrhea.  Genitourinary: Negative for dysuria and hematuria.  Musculoskeletal: Positive for arthralgias. Negative for back pain and myalgias.  Skin: Negative for rash.  Neurological: Negative for dizziness, syncope and headaches.  Hematological: Negative for adenopathy.  Psychiatric/Behavioral: Negative for confusion and dysphoric mood.       Objective:   Physical Exam Constitutional:      Appearance: She is well-developed.  HENT:     Head: Normocephalic and atraumatic.  Eyes:     Pupils: Pupils are equal, round, and reactive to light.  Neck:     Thyroid: No thyromegaly.  Cardiovascular:     Rate and Rhythm: Normal rate and regular rhythm.     Heart sounds: Normal heart sounds. No murmur.  Pulmonary:     Effort: No respiratory distress.     Breath sounds: Normal breath sounds. No wheezing or rales.  Abdominal:     General: Bowel sounds are normal. There is no distension.  Palpations: Abdomen is soft. There is no mass.     Tenderness: There is no abdominal tenderness. There is no guarding or rebound.  Musculoskeletal:        General: Normal range of motion.     Cervical back: Normal range of motion and neck supple.  Lymphadenopathy:     Cervical: No cervical adenopathy.  Skin:    Findings: No rash.  Neurological:     Mental Status: She is alert and oriented to  person, place, and time.     Cranial Nerves: No cranial nerve deficit.     Deep Tendon Reflexes: Reflexes normal.  Psychiatric:        Behavior: Behavior normal.        Thought Content: Thought content normal.        Judgment: Judgment normal.        Assessment:     Physical exam.  Laura Wheeler has no significant chronic medical problems other than primary osteoarthritis involving multiple joints.  Health maintenance up-to-date with exception of tetanus but she does had Covid vaccine just last week so we deferred at this time    Plan:     -Reminder to consider tetanus at some point later this year  -Obtain screening labs  -Continue regular exercise habits  -She plans to continue with regular GYN follow-up  Eulas Post MD [facility]

## 2020-04-15 ENCOUNTER — Ambulatory Visit: Payer: 59

## 2020-05-16 ENCOUNTER — Ambulatory Visit: Payer: 59

## 2020-06-11 ENCOUNTER — Other Ambulatory Visit: Payer: Self-pay

## 2020-06-11 ENCOUNTER — Ambulatory Visit
Admission: RE | Admit: 2020-06-11 | Discharge: 2020-06-11 | Disposition: A | Discharge status: Home / Self Care | Payer: No Typology Code available for payment source | Source: Ambulatory Visit | Attending: Obstetrics and Gynecology | Admitting: Obstetrics and Gynecology

## 2020-06-11 DIAGNOSIS — Z803 Family history of malignant neoplasm of breast: Secondary | ICD-10-CM

## 2020-06-11 MED ORDER — GADOBUTROL 1 MMOL/ML IV SOLN
7.0000 mL | Freq: Once | INTRAVENOUS | Status: AC | PRN
  Administered 2020-06-11: 7 mL via INTRAVENOUS

## 2020-10-02 ENCOUNTER — Encounter: Payer: Self-pay | Admitting: Family Medicine

## 2020-10-08 ENCOUNTER — Ambulatory Visit: Payer: Self-pay | Admitting: Family Medicine

## 2020-10-23 ENCOUNTER — Telehealth (INDEPENDENT_AMBULATORY_CARE_PROVIDER_SITE_OTHER): Payer: 59 | Admitting: Family Medicine

## 2020-10-23 ENCOUNTER — Encounter: Payer: Self-pay | Admitting: Family Medicine

## 2020-10-23 ENCOUNTER — Other Ambulatory Visit: Payer: Self-pay

## 2020-10-23 ENCOUNTER — Ambulatory Visit: Payer: 59

## 2020-10-23 ENCOUNTER — Ambulatory Visit (INDEPENDENT_AMBULATORY_CARE_PROVIDER_SITE_OTHER)
Admission: RE | Admit: 2020-10-23 | Discharge: 2020-10-23 | Disposition: A | Discharge status: Home / Self Care | Payer: 59 | Source: Ambulatory Visit | Attending: Family Medicine | Admitting: Family Medicine

## 2020-10-23 VITALS — Ht 65.0 in | Wt 160.0 lb

## 2020-10-23 DIAGNOSIS — M79601 Pain in right arm: Secondary | ICD-10-CM

## 2020-10-23 DIAGNOSIS — R062 Wheezing: Secondary | ICD-10-CM

## 2020-10-23 MED ORDER — ALBUTEROL SULFATE HFA 108 (90 BASE) MCG/ACT IN AERS: 2.0000 | INHALATION_SPRAY | Freq: Four times a day (QID) | RESPIRATORY_TRACT | 0 refills | Status: DC | PRN

## 2020-10-23 MED ORDER — PREDNISONE 10 MG PO TABS: ORAL_TABLET | ORAL | 0 refills | Status: DC

## 2020-10-23 NOTE — Progress Notes (Signed)
Patient ID: Laura Wheeler, female   DOB: [birth year], 59 y.o.   MRN: [id number]  This visit type was conducted due to national recommendations for restrictions regarding the COVID-19 pandemic in an effort to limit this patient's exposure and mitigate transmission in our community.   Virtual Visit via Telephone Note  I connected with Laura Wheeler on 10/23/20 at  9:15 AM EDT by telephone and verified that I am speaking with the correct person using two identifiers.   I discussed the limitations, risks, security and privacy concerns of performing an evaluation and management service by telephone and the availability of in person appointments. I also discussed with the patient that there may be a patient responsible charge related to this service. The patient expressed understanding and agreed to proceed.  Location patient: home Location provider: work or home office Participants present for the call: patient, provider Patient did not have a visit in the prior 7 days to address this/these issue(s).   History of Present Illness:  Laura Wheeler relates the following concerns.  She has had right proximal lateral arm pain for months.  She had Covid vaccine back in April and she noticed soreness then and feels like this has persisted.  She sometimes has intermittent "throbbing" pain right arm.  No visible swelling.  No redness.  Her main concern is that her father (who is in his 60s) was recently diagnosed with bone cancer and had amputation of the leg.  She does not have any consistent pain in her neck or upper back.  Some pain with abducting the shoulder. No recent appetite or weight changes.  Second concern is that she has some wheezing and cough past several days.  No history of asthma.  No major dyspnea.  No fever.  No body aches.  Still working full-time.  She has used albuterol inhaler in the past but currently her inhaler is out  Past Medical History:  Diagnosis Date  . Arthritis    "knees"  (05/19/2013)  . History of cardiac radiofrequency ablation 04/2013  . Hx of echocardiogram    Echo (12/15):  EF 60-65%, no RWMA, Gr 1 DD  . SVT (supraventricular tachycardia) (HCC)   . WPW (Wolff-Parkinson-White syndrome) ~ 1983   a. s/p ablation 05/20/13.   Past Surgical History:  Procedure Laterality Date  . ANAL FISSURE REPAIR  1980's  . BREAST BIOPSY Left 2000's  . EXCISION MORTON'S NEUROMA Left 05/04/2013   Procedure: LEFT SECOND MT  WEIL/SECOND WEBB SPACE NEUROMA EXCISION ;  Surgeon: [name], MD;  Location: [facility] OR;  Service: Orthopedics;  Laterality: Left;  . FOOT SURGERY Left 09/2014   hardware removal  . SUPRAVENTRICULAR TACHYCARDIA ABLATION  05/19/2013  . SUPRAVENTRICULAR TACHYCARDIA ABLATION N/A 05/19/2013   Procedure: SUPRAVENTRICULAR TACHYCARDIA ABLATION;  Surgeon: Marinus Maw, MD;  Location: [facility] CATH LAB;  Service: Cardiovascular;  Laterality: N/A;    reports that she has never smoked. She has never used smokeless tobacco. She reports current alcohol use of about 4.0 standard drinks of alcohol per week. She reports that she does not use drugs. family history includes Depression in her mother; Diabetes in her mother; Melanoma in her mother. Allergies  Allergen Reactions  . Sulfa Antibiotics Itching    Doesn't remember reaction - 20 years ago      Observations/Objective: Patient sounds cheerful and well on the phone. I do not appreciate any SOB. Speech and thought processing are grossly intact. Patient reported vitals:  Assessment and Plan:  #1 right proximal  lateral arm pain.  This seemed to start around time of her vaccine.  She specifically is requesting x-rays -Order for x-ray right humerus.  If normal and arm pain persists recommend office follow-up to further assess  #2 intermittent wheezing and mild cough -Consider Covid testing-although sounds like low probability -Refill albuterol inhaler for as needed use -Prednisone taper starting at 40 mg  daily -Follow-up immediately for any fever or other concerns  Follow Up Instructions:  - as above.   [id number] [id number] [id number] 11-20 [id number] 21-30 I did not refer this patient for an OV in the next 24 hours for this/these issue(s).  I discussed the assessment and treatment plan with the patient. The patient was provided an opportunity to ask questions and all were answered. The patient agreed with the plan and demonstrated an understanding of the instructions.   The patient was advised to call back or seek an in-person evaluation if the symptoms worsen or if the condition fails to improve as anticipated.  I provided 22 minutes of non-face-to-face time during this encounter.   Laura Peat, MD

## 2020-10-24 ENCOUNTER — Telehealth: Payer: Self-pay

## 2020-10-24 NOTE — Telephone Encounter (Signed)
error 

## 2020-10-24 NOTE — Telephone Encounter (Signed)
Called and informed pt of this,May take a couple of days for the prednisone to see much effect.    OK to take OTC Mucinex.  Most bronchitis is viral.   Average duration is about 3 weeks.  Be in touch if not better in one week.

## 2020-10-30 ENCOUNTER — Encounter: Payer: Self-pay | Admitting: Family Medicine

## 2020-10-30 ENCOUNTER — Other Ambulatory Visit: Payer: Self-pay

## 2020-10-30 ENCOUNTER — Ambulatory Visit (INDEPENDENT_AMBULATORY_CARE_PROVIDER_SITE_OTHER): Payer: 59 | Admitting: Family Medicine

## 2020-10-30 ENCOUNTER — Ambulatory Visit (INDEPENDENT_AMBULATORY_CARE_PROVIDER_SITE_OTHER): Payer: 59

## 2020-10-30 VITALS — Temp 98.5°F | Ht 65.0 in | Wt 165.8 lb

## 2020-10-30 DIAGNOSIS — M79601 Pain in right arm: Secondary | ICD-10-CM

## 2020-10-30 DIAGNOSIS — R062 Wheezing: Secondary | ICD-10-CM

## 2020-10-30 DIAGNOSIS — R059 Cough, unspecified: Secondary | ICD-10-CM | POA: Diagnosis not present

## 2020-10-30 MED ORDER — BENZONATATE 100 MG PO CAPS: 100.0000 mg | ORAL_CAPSULE | Freq: Three times a day (TID) | ORAL | 0 refills | Status: DC | PRN

## 2020-10-30 NOTE — Patient Instructions (Signed)

## 2020-10-30 NOTE — Progress Notes (Signed)
Established Patient Office Visit  Subjective:  Patient ID: Laura Wheeler, female    DOB: 01-Apr-1961  Age: 59 y.o. MRN: [id number]  CC:  Chief Complaint  Patient presents with  . Nasal Congestion    coughing, sob     HPI Laura Wheeler presents for recent respiratory symptoms.  Her symptoms started just prior to 10/16/2020.  She had some nasal congestion and cough and has had some residual cough.  She had Covid test through [facility] which is negative.  We did virtual visit last week and she was describing some wheezing and we sent in albuterol inhaler along with prednisone.  She does think the prednisone is helped some.  Her major issue now is that she is having some shortness of breath and increased cough at night when lying supine.  Albuterol seems to help temporarily.  She denies any chest pain.  No pleuritic pain.  No calf or lower extremity pain or swelling.  She is currently taking Xyzal and Astelin for possible allergy symptoms.  She is on her last day of prednisone now.  She requested chest x-ray to further assess.  No loss of taste or smell.  No nausea, vomiting, or diarrhea.  She had some recent right humerus pains and x-ray revealed no acute abnormalities.  No neck pain.  No pain with abduction or internal rotation.  She had requested humerus films as her dad apparently died of a type of bone cancer.  Past Medical History:  Diagnosis Date  . Arthritis    "knees" (05/19/2013)  . History of cardiac radiofrequency ablation 04/2013  . Hx of echocardiogram    Echo (12/15):  EF 60-65%, no RWMA, Gr 1 DD  . SVT (supraventricular tachycardia) (HCC)   . WPW (Wolff-Parkinson-White syndrome) ~ 1983   a. s/p ablation 05/20/13.    Past Surgical History:  Procedure Laterality Date  . ANAL FISSURE REPAIR  1980's  . BREAST BIOPSY Left 2000's  . EXCISION MORTON'S NEUROMA Left 05/04/2013   Procedure: LEFT SECOND MT  WEIL/SECOND WEBB SPACE NEUROMA EXCISION ;  Surgeon: [name], MD;   Location: [facility] OR;  Service: Orthopedics;  Laterality: Left;  . FOOT SURGERY Left 09/2014   hardware removal  . SUPRAVENTRICULAR TACHYCARDIA ABLATION  05/19/2013  . SUPRAVENTRICULAR TACHYCARDIA ABLATION N/A 05/19/2013   Procedure: SUPRAVENTRICULAR TACHYCARDIA ABLATION;  Surgeon: Marinus Maw, MD;  Location: [facility] CATH LAB;  Service: Cardiovascular;  Laterality: N/A;    Family History  Problem Relation Age of Onset  . Diabetes Mother   . Depression Mother   . Melanoma Mother   . Heart attack Neg Hx     Social History   Socioeconomic History  . Marital status: Married    Spouse name: Loraine Leriche  . Number of children: 2  . Years of education: BS  . Highest education level: Not on file  Occupational History    Employer: [employer]    Comment: [place]  Tobacco Use  . Smoking status: Never Smoker  . Smokeless tobacco: Never Used  Substance and Sexual Activity  . Alcohol use: Yes    Alcohol/week: 4.0 standard drinks    Types: 4 Glasses of wine per week    Comment: 1-2 glasses occassionaly  . Drug use: No  . Sexual activity: Yes    Birth control/protection: Inserts  Other Topics Concern  . Not on file  Social History Narrative   Patient is married and lives at home with her husband.  New job in 2017, [employer]   No regular exercise - diet is ok.   Social Determinants of Health   Financial Resource Strain:   . Difficulty of Paying Living Expenses: Not on file  Food Insecurity:   . Worried About Programme researcher, broadcasting/film/video in the Last Year: Not on file  . Ran Out of Food in the Last Year: Not on file  Transportation Needs:   . Lack of Transportation (Medical): Not on file  . Lack of Transportation (Non-Medical): Not on file  Physical Activity:   . Days of Exercise per Week: Not on file  . Minutes of Exercise per Session: Not on file  Stress:   . Feeling of Stress : Not on file  Social Connections:   . Frequency of Communication with Friends and Family: Not on  file  . Frequency of Social Gatherings with Friends and Family: Not on file  . Attends Religious Services: Not on file  . Active Member of Clubs or Organizations: Not on file  . Attends Banker Meetings: Not on file  . Marital Status: Not on file  Intimate Partner Violence:   . Fear of Current or Ex-Partner: Not on file  . Emotionally Abused: Not on file  . Physically Abused: Not on file  . Sexually Abused: Not on file    Outpatient Medications Prior to Visit  Medication Sig Dispense Refill  . albuterol (VENTOLIN HFA) 108 (90 Base) MCG/ACT inhaler Inhale 2 puffs into the lungs every 6 (six) hours as needed for wheezing or shortness of breath. 18 g 0  . Azelastine-Fluticasone 137-50 MCG/ACT SUSP     . Calcium Carb-Cholecalciferol (CALCIUM + D3 PO) Take 600 mg by mouth 2 (two) times daily.    . diclofenac sodium (VOLTAREN) 1 % GEL Apply 4 g topically 4 (four) times daily. To affected joint. 100 g 11  . levocetirizine (XYZAL) 5 MG tablet Take 5 mg by mouth at bedtime.    . Multiple Vitamin (MULTIVITAMINS PO) Take by mouth.    Marland Kitchen OVER THE COUNTER MEDICATION Shackley supplement for joints    . OVER THE COUNTER MEDICATION Shackley supplement for vitamin C    . predniSONE (DELTASONE) 10 MG tablet Taper as follows: 4-4-4-3-3-2-2-1-1 24 tablet 0   No facility-administered medications prior to visit.    Allergies  Allergen Reactions  . Sulfa Antibiotics Itching    Doesn't remember reaction - 20 years ago    ROS Review of Systems  Constitutional: Negative for chills and fever.  Respiratory: Positive for cough, shortness of breath and wheezing.   Cardiovascular: Negative for chest pain, palpitations and leg swelling.  Neurological: Negative for dizziness.      Objective:    Physical Exam Vitals reviewed.  Constitutional:      Appearance: Normal appearance.  Cardiovascular:     Rate and Rhythm: Normal rate and regular rhythm.  Pulmonary:     Effort: Pulmonary effort  is normal.     Breath sounds: Normal breath sounds. No wheezing or rales.  Musculoskeletal:     Comments: Full range of motion right shoulder.  Right arm nontender to palpation  Neurological:     Mental Status: She is alert.     Temp 98.5 F (36.9 C) (Oral)   Ht 5\' 5"  (1.651 m)   Wt 165 lb 12.8 oz (75.2 kg)   LMP 09/20/2014   SpO2 97%   BMI 27.59 kg/m  Wt Readings from Last 3 Encounters:  10/30/20 165 lb 12.8 oz (75.2 kg)  10/23/20 160 lb (72.6 kg)  03/19/20 158 lb (71.7 kg)     Health Maintenance Due  Topic Date Due  . TETANUS/TDAP  12/22/2019  . COVID-19 Vaccine (2 - Pfizer 2-dose series) 04/02/2020  . INFLUENZA VACCINE  07/21/2020    There are no preventive care reminders to display for this patient.  Lab Results  Component Value Date   TSH 1.82 03/19/2020   Lab Results  Component Value Date   WBC 7.0 03/19/2020   HGB 13.6 03/19/2020   HCT 40.1 03/19/2020   MCV 92.9 03/19/2020   PLT 374.0 03/19/2020   Lab Results  Component Value Date   NA 136 03/19/2020   K 4.8 03/19/2020   CO2 29 03/19/2020   GLUCOSE 87 03/19/2020   BUN 15 03/19/2020   CREATININE 0.67 03/19/2020   BILITOT 0.6 03/19/2020   ALKPHOS 73 03/19/2020   AST 20 03/19/2020   ALT 20 03/19/2020   PROT 7.2 03/19/2020   ALBUMIN 4.5 03/19/2020   CALCIUM 10.0 03/19/2020   GFR 90.19 03/19/2020   Lab Results  Component Value Date   CHOL 167 03/19/2020   Lab Results  Component Value Date   HDL 55.10 03/19/2020   Lab Results  Component Value Date   LDLCALC 90 03/19/2020   Lab Results  Component Value Date   TRIG 110.0 03/19/2020   Lab Results  Component Value Date   CHOLHDL 3 03/19/2020   Lab Results  Component Value Date   HGBA1C 5.5 03/02/2019      Assessment & Plan:   Problem List Items Addressed This Visit    None    Visit Diagnoses    Coughing    -  Primary   Relevant Orders   DG Chest 2 View (Completed)    She presents with over 2-week history of some nasal  congestion and cough with negative Covid testing this past Monday.  She is in no respiratory distress.  She has had evidence of some reactive airway findings and has improved some with prednisone and albuterol.  Chest x-ray today in office shows normal heart size.  No effusion.  No infiltrates.  No active findings.  Her pulse oximetry is 97% room air.  Suspect she has some residual bronchitis symptoms-non-Covid related  Continue albuterol as needed.  Finish out prednisone.  Tessalon Perles 100 mg every 8 hours as needed for cough  Meds ordered this encounter  Medications  . benzonatate (TESSALON PERLES) 100 MG capsule    Sig: Take 1 capsule (100 mg total) by mouth 3 (three) times daily as needed for cough.    Dispense:  30 capsule    Refill:  0    Follow-up: No follow-ups on file.    Evelena Peat, MD

## 2020-11-15 ENCOUNTER — Other Ambulatory Visit: Payer: Self-pay | Admitting: Family Medicine

## 2020-12-10 ENCOUNTER — Other Ambulatory Visit: Payer: Self-pay

## 2020-12-10 ENCOUNTER — Other Ambulatory Visit: Payer: 59

## 2020-12-10 ENCOUNTER — Encounter: Payer: Self-pay | Admitting: Family Medicine

## 2020-12-10 ENCOUNTER — Telehealth (INDEPENDENT_AMBULATORY_CARE_PROVIDER_SITE_OTHER): Payer: 59 | Admitting: Family Medicine

## 2020-12-10 VITALS — Wt 161.0 lb

## 2020-12-10 DIAGNOSIS — N390 Urinary tract infection, site not specified: Secondary | ICD-10-CM

## 2020-12-10 MED ORDER — CIPROFLOXACIN HCL 500 MG PO TABS: 500.0000 mg | ORAL_TABLET | Freq: Two times a day (BID) | ORAL | 0 refills | Status: DC

## 2020-12-10 NOTE — Progress Notes (Signed)
   Subjective:    Patient ID: Laura Wheeler, female    DOB: June 07, 1961, 59 y.o.   MRN: [id number]  HPI Virtual Visit via Telephone Note  I connected with the patient on 12/10/20 at  3:30 PM EST by telephone and verified that I am speaking with the correct person using two identifiers.   I discussed the limitations, risks, security and privacy concerns of performing an evaluation and management service by telephone and the availability of in person appointments. I also discussed with the patient that there may be a patient responsible charge related to this service. The patient expressed understanding and agreed to proceed.  Location patient: home Location provider: work or home office Participants present for the call: patient, provider Patient did not have a visit in the prior 7 days to address this/these issue(s).   History of Present Illness: Here for 2 days of urgency to urinate with burning. No back pain or fever or nausea. She took an OTC which appeared to be positive.    Observations/Objective: Patient sounds cheerful and well on the phone. I do not appreciate any SOB. Speech and thought processing are grossly intact. Patient reported vitals:  Assessment and Plan: UTI, treat with Cipro. Drink plenty of water. She dropped a urine sample by the clinic today, so we will send this for a culture.  Gershon Crane, MD   Follow Up Instructions:     [phone] 11-20 [phone] 21-30 I did not refer this patient for an OV in the next 24 hours for this/these issue(s).  I discussed the assessment and treatment plan with the patient. The patient was provided an opportunity to ask questions and all were answered. The patient agreed with the plan and demonstrated an understanding of the instructions.   The patient was advised to call back or seek an in-person evaluation if the symptoms worsen or if the condition fails to improve as anticipated.  I provided 10 minutes of  non-face-to-face time during this encounter.   Gershon Crane, MD    Review of Systems     Objective:   Physical Exam        Assessment & Plan:

## 2020-12-11 LAB — URINE CULTURE
MICRO NUMBER:: 11344064
Result:: NO GROWTH
SPECIMEN QUALITY:: ADEQUATE

## 2020-12-11 LAB — URINALYSIS
Bilirubin Urine: NEGATIVE
Glucose, UA: NEGATIVE
Hgb urine dipstick: NEGATIVE
Ketones, ur: NEGATIVE
Leukocytes,Ua: NEGATIVE
Nitrite: NEGATIVE
Protein, ur: NEGATIVE
Specific Gravity, Urine: 1.016 (ref 1.001–1.03)
pH: 6.5 (ref 5.0–8.0)

## 2021-03-24 ENCOUNTER — Other Ambulatory Visit: Payer: Self-pay

## 2021-03-25 ENCOUNTER — Ambulatory Visit (INDEPENDENT_AMBULATORY_CARE_PROVIDER_SITE_OTHER): Payer: 59 | Admitting: Family Medicine

## 2021-03-25 ENCOUNTER — Encounter: Payer: Self-pay | Admitting: Family Medicine

## 2021-03-25 VITALS — BP 128/80 | HR 106 | Temp 98.6°F | Ht 65.0 in | Wt 166.2 lb

## 2021-03-25 DIAGNOSIS — Z0000 Encounter for general adult medical examination without abnormal findings: Secondary | ICD-10-CM

## 2021-03-25 NOTE — Patient Instructions (Signed)
Preventive Care 60-60 Years Old, Female Preventive care refers to lifestyle choices and visits with your health care provider that can promote health and wellness. This includes:  A yearly physical exam. This is also called an annual wellness visit.  Regular dental and eye exams.  Immunizations.  Screening for certain conditions.  Healthy lifestyle choices, such as: ? Eating a healthy diet. ? Getting regular exercise. ? Not using drugs or products that contain nicotine and tobacco. ? Limiting alcohol use. What can I expect for my preventive care visit? Physical exam Your health care provider will check your:  Height and weight. These may be used to calculate your BMI (body mass index). BMI is a measurement that tells if you are at a healthy weight.  Heart rate and blood pressure.  Body temperature.  Skin for abnormal spots. Counseling Your health care provider may ask you questions about your:  Past medical problems.  Family's medical history.  Alcohol, tobacco, and drug use.  Emotional well-being.  Home life and relationship well-being.  Sexual activity.  Diet, exercise, and sleep habits.  Work and work Statistician.  Access to firearms.  Method of birth control.  Menstrual cycle.  Pregnancy history. What immunizations do I need? Vaccines are usually given at various ages, according to a schedule. Your health care provider will recommend vaccines for you based on your age, medical history, and lifestyle or other factors, such as travel or where you work.   What tests do I need? Blood tests  Lipid and cholesterol levels. These may be checked every 5 years, or more often if you are over 60 years old.  Hepatitis C test.  Hepatitis B test. Screening  Lung cancer screening. You may have this screening every year starting at age 60 if you have a 30-pack-year history of smoking and currently smoke or have quit within the past 15 years.  Colorectal cancer  screening. ? All adults should have this screening starting at age 60 and continuing until age 60. ? Your health care provider may recommend screening at age 60 if you are at increased risk. ? You will have tests every 1-10 years, depending on your results and the type of screening test.  Diabetes screening. ? This is done by checking your blood sugar (glucose) after you have not eaten for a while (fasting). ? You may have this done every 1-3 years.  Mammogram. ? This may be done every 1-2 years. ? Talk with your health care provider about when you should start having regular mammograms. This may depend on whether you have a family history of breast cancer.  BRCA-related cancer screening. This may be done if you have a family history of breast, ovarian, tubal, or peritoneal cancers.  Pelvic exam and Pap test. ? This may be done every 3 years starting at age 60. ? Starting at age 60, this may be done every 5 years if you have a Pap test in combination with an HPV test. Other tests  STD (sexually transmitted disease) testing, if you are at risk.  Bone density scan. This is done to screen for osteoporosis. You may have this scan if you are at high risk for osteoporosis. Talk with your health care provider about your test results, treatment options, and if necessary, the need for more tests. Follow these instructions at home: Eating and drinking  Eat a diet that includes fresh fruits and vegetables, whole grains, lean protein, and low-fat dairy products.  Take vitamin and mineral supplements  as recommended by your health care provider.  Do not drink alcohol if: ? Your health care provider tells you not to drink. ? You are pregnant, may be pregnant, or are planning to become pregnant.  If you drink alcohol: ? Limit how much you have to 0-1 drink a day. ? Be aware of how much alcohol is in your drink. In the [place] one drink equals one 12 oz bottle of beer (355 mL), one 5 oz glass of  wine (148 mL), or one 1 oz glass of hard liquor (44 mL).   Lifestyle  Take daily care of your teeth and gums. Brush your teeth every morning and night with fluoride toothpaste. Floss one time each day.  Stay active. Exercise for at least 30 minutes 5 or more days each week.  Do not use any products that contain nicotine or tobacco, such as cigarettes, e-cigarettes, and chewing tobacco. If you need help quitting, ask your health care provider.  Do not use drugs.  If you are sexually active, practice safe sex. Use a condom or other form of protection to prevent STIs (sexually transmitted infections).  If you do not wish to become pregnant, use a form of birth control. If you plan to become pregnant, see your health care provider for a prepregnancy visit.  If told by your health care provider, take low-dose aspirin daily starting at age 50.  Find healthy ways to cope with stress, such as: ? Meditation, yoga, or listening to music. ? Journaling. ? Talking to a trusted person. ? Spending time with friends and family. Safety  Always wear your seat belt while driving or riding in a vehicle.  Do not drive: ? If you have been drinking alcohol. Do not ride with someone who has been drinking. ? When you are tired or distracted. ? While texting.  Wear a helmet and other protective equipment during sports activities.  If you have firearms in your house, make sure you follow all gun safety procedures. What's next?  Visit your health care provider once a year for an annual wellness visit.  Ask your health care provider how often you should have your eyes and teeth checked.  Stay up to date on all vaccines. This information is not intended to replace advice given to you by your health care provider. Make sure you discuss any questions you have with your health care provider. Document Revised: 09/10/2020 Document Reviewed: 08/18/2018 Elsevier Patient Education  2021 [employer]  

## 2021-03-25 NOTE — Progress Notes (Signed)
Established Patient Office Visit  Subjective:  Patient ID: Laura Wheeler, female    DOB: [birth year]  Age: 60 y.o. MRN: [id number]  CC:  Chief Complaint  Patient presents with  . Annual Exam    No new concnerns    HPI Laura Wheeler presents for physical exam.  She sees gynecologist regularly and gets Pap smears and mammograms through them.  She is had some recent bronchial issues and saw allergist.  She is currently on prednisone and would like to defer labs until next week because of that.  Generally fairly healthy.  She does have some osteoarthritis issues in her knees and is recently had some injection therapy which she thinks may be helping.  Maintenance reviewed:  -Flu vaccine already given -COVID vaccines up-to-date -Previous hepatitis C screen negative -Colonoscopy due 2025 -She gets yearly mammograms through GYN -Previous Shingrix given  Social history-she is married.  Has 2 children.  No grandchildren.  Never smoked.  No regular alcohol.  She works as a Actor projects.  Family history-mother has multiple issues including history of depression, type 2 diabetes, hyperlipidemia hypothyroidism, history of melanoma.  Her father was diagnosed in his 65s with what sounds like osteosarcoma of the leg.  He has hypertension history as well.  Brother with osteoarthritis.  Past Medical History:  Diagnosis Date  . Arthritis    "knees" (05/19/2013)  . History of cardiac radiofrequency ablation 04/2013  . Hx of echocardiogram    Echo (12/15):  EF 60-65%, no RWMA, Gr 1 DD  . SVT (supraventricular tachycardia) (HCC)   . WPW (Wolff-Parkinson-White syndrome) ~ 1983   a. s/p ablation 05/20/13.    Past Surgical History:  Procedure Laterality Date  . ANAL FISSURE REPAIR  1980's  . BREAST BIOPSY Left 2000's  . EXCISION MORTON'S NEUROMA Left 05/04/2013   Procedure: LEFT SECOND MT  WEIL/SECOND WEBB SPACE NEUROMA EXCISION ;  Surgeon: [name], MD;   Location: [facility] OR;  Service: Orthopedics;  Laterality: Left;  . FOOT SURGERY Left 09/2014   hardware removal  . SUPRAVENTRICULAR TACHYCARDIA ABLATION  05/19/2013  . SUPRAVENTRICULAR TACHYCARDIA ABLATION N/A 05/19/2013   Procedure: SUPRAVENTRICULAR TACHYCARDIA ABLATION;  Surgeon: Marinus Maw, MD;  Location: [facility] CATH LAB;  Service: Cardiovascular;  Laterality: N/A;    Family History  Problem Relation Age of Onset  . Diabetes Mother   . Depression Mother   . Melanoma Mother   . Hypothyroidism Mother   . Cancer Father        ?osteosarcoma leg  . Hypertension Father   . Arthritis Brother   . Heart attack Neg Hx     Social History   Socioeconomic History  . Marital status: Married    Spouse name: Loraine Leriche  . Number of children: 2  . Years of education: BS  . Highest education level: Not on file  Occupational History    Employer: [employer]    Comment: [place]  Tobacco Use  . Smoking status: Never Smoker  . Smokeless tobacco: Never Used  Substance and Sexual Activity  . Alcohol use: Yes    Alcohol/week: 4.0 standard drinks    Types: 4 Glasses of wine per week    Comment: 1-2 glasses occassionaly  . Drug use: No  . Sexual activity: Yes    Birth control/protection: Inserts  Other Topics Concern  . Not on file  Social History Narrative   Patient is married and lives at home with her husband.  New job in 2017, [employer]   No regular exercise - diet is ok.   Social Determinants of Health   Financial Resource Strain: Not on file  Food Insecurity: Not on file  Transportation Needs: Not on file  Physical Activity: Not on file  Stress: Not on file  Social Connections: Not on file  Intimate Partner Violence: Not on file    Outpatient Medications Prior to Visit  Medication Sig Dispense Refill  . Azelastine-Fluticasone 137-50 MCG/ACT SUSP     . Calcium Carb-Cholecalciferol (CALCIUM + D3 PO) Take 600 mg by mouth 2 (two) times daily.    . ciprofloxacin  (CIPRO) 500 MG tablet Take 1 tablet (500 mg total) by mouth 2 (two) times daily. 14 tablet 0  . levocetirizine (XYZAL) 5 MG tablet Take 5 mg by mouth at bedtime.    . MELOXICAM PO Take by mouth.    . Multiple Vitamin (MULTIVITAMINS PO) Take by mouth.    Marland Kitchen OVER THE COUNTER MEDICATION Shackley supplement for joints    . OVER THE COUNTER MEDICATION Shackley supplement for vitamin C    . predniSONE (DELTASONE) 20 MG tablet PLEASE SEE ATTACHED FOR DETAILED DIRECTIONS    . SYMBICORT 80-4.5 MCG/ACT inhaler Inhale into the lungs.     No facility-administered medications prior to visit.    Allergies  Allergen Reactions  . Sulfa Antibiotics Itching    Doesn't remember reaction - 20 years ago    ROS Review of Systems  Constitutional: Negative for activity change, appetite change, fatigue, fever and unexpected weight change.  HENT: Negative for ear pain, hearing loss, sore throat and trouble swallowing.   Eyes: Negative for visual disturbance.  Respiratory: Negative for cough and shortness of breath.   Cardiovascular: Negative for chest pain and palpitations.  Gastrointestinal: Negative for abdominal pain, blood in stool, constipation and diarrhea.  Genitourinary: Negative for dysuria and hematuria.  Musculoskeletal: Positive for arthralgias. Negative for back pain and myalgias.  Skin: Negative for rash.  Neurological: Negative for dizziness, syncope and headaches.  Hematological: Negative for adenopathy.  Psychiatric/Behavioral: Negative for confusion and dysphoric mood.      Objective:    Physical Exam Constitutional:      Appearance: She is well-developed.  HENT:     Head: Normocephalic and atraumatic.  Eyes:     Pupils: Pupils are equal, round, and reactive to light.  Neck:     Thyroid: No thyromegaly.  Cardiovascular:     Rate and Rhythm: Normal rate and regular rhythm.     Heart sounds: Normal heart sounds. No murmur heard.   Pulmonary:     Effort: No respiratory  distress.     Breath sounds: Normal breath sounds. No wheezing or rales.  Abdominal:     General: Bowel sounds are normal. There is no distension.     Palpations: Abdomen is soft. There is no mass.     Tenderness: There is no abdominal tenderness. There is no guarding or rebound.  Musculoskeletal:        General: Normal range of motion.     Cervical back: Normal range of motion and neck supple.  Lymphadenopathy:     Cervical: No cervical adenopathy.  Skin:    Findings: No rash.  Neurological:     Mental Status: She is alert and oriented to person, place, and time.     Cranial Nerves: No cranial nerve deficit.     Deep Tendon Reflexes: Reflexes normal.  Psychiatric:  Behavior: Behavior normal.        Thought Content: Thought content normal.        Judgment: Judgment normal.     BP 128/80 (BP Location: Left Arm, Cuff Size: Normal)   Pulse (!) 106   Temp 98.6 F (37 C) (Oral)   Ht 5\' 5"  (1.651 m)   Wt 166 lb 3.2 oz (75.4 kg)   LMP 09/20/2014   SpO2 93%   BMI 27.66 kg/m  Wt Readings from Last 3 Encounters:  03/25/21 166 lb 3.2 oz (75.4 kg)  12/10/20 161 lb (73 kg)  10/30/20 165 lb 12.8 oz (75.2 kg)     There are no preventive care reminders to display for this patient.  There are no preventive care reminders to display for this patient.  Lab Results  Component Value Date   TSH 1.82 03/19/2020   Lab Results  Component Value Date   WBC 7.0 03/19/2020   HGB 13.6 03/19/2020   HCT 40.1 03/19/2020   MCV 92.9 03/19/2020   PLT 374.0 03/19/2020   Lab Results  Component Value Date   NA 136 03/19/2020   K 4.8 03/19/2020   CO2 29 03/19/2020   GLUCOSE 87 03/19/2020   BUN 15 03/19/2020   CREATININE 0.67 03/19/2020   BILITOT 0.6 03/19/2020   ALKPHOS 73 03/19/2020   AST 20 03/19/2020   ALT 20 03/19/2020   PROT 7.2 03/19/2020   ALBUMIN 4.5 03/19/2020   CALCIUM 10.0 03/19/2020   GFR 90.19 03/19/2020   Lab Results  Component Value Date   CHOL 167 03/19/2020    Lab Results  Component Value Date   HDL 55.10 03/19/2020   Lab Results  Component Value Date   LDLCALC 90 03/19/2020   Lab Results  Component Value Date   TRIG 110.0 03/19/2020   Lab Results  Component Value Date   CHOLHDL 3 03/19/2020   Lab Results  Component Value Date   HGBA1C 5.5 03/02/2019      Assessment & Plan:   Problem List Items Addressed This Visit   None   Visit Diagnoses    Physical exam    -  Primary   Relevant Orders   Basic metabolic panel   Lipid panel   CBC with Differential/Platelet   TSH   Hepatic function panel    We discussed the following health maintenance issues  -Recommend she continue with yearly dermatology follow-up with positive family history of melanoma -She will return next week for fasting labs and she is on prednisone today -We recommend regular weightbearing exercise -She will continue follow-up with GYN regarding Pap smears and mammogram -Vaccines up-to-date with exception of tetanus.  She declines today but recommend she get during the next year  No orders of the defined types were placed in this encounter.   Follow-up: No follow-ups on file.    05/02/2019, MD

## 2021-04-02 ENCOUNTER — Other Ambulatory Visit (INDEPENDENT_AMBULATORY_CARE_PROVIDER_SITE_OTHER): Payer: 59

## 2021-04-02 ENCOUNTER — Other Ambulatory Visit: Payer: Self-pay

## 2021-04-02 DIAGNOSIS — Z0000 Encounter for general adult medical examination without abnormal findings: Secondary | ICD-10-CM

## 2021-04-02 LAB — CBC WITH DIFFERENTIAL/PLATELET
Basophils Absolute: 0.1 10*3/uL (ref 0.0–0.1)
Basophils Relative: 1.2 % (ref 0.0–3.0)
Eosinophils Absolute: 0.3 10*3/uL (ref 0.0–0.7)
Eosinophils Relative: 5.4 % — ABNORMAL HIGH (ref 0.0–5.0)
HCT: 39 % (ref 36.0–46.0)
Hemoglobin: 13.1 g/dL (ref 12.0–15.0)
Lymphocytes Relative: 48.2 % — ABNORMAL HIGH (ref 12.0–46.0)
Lymphs Abs: 2.9 10*3/uL (ref 0.7–4.0)
MCHC: 33.6 g/dL (ref 30.0–36.0)
MCV: 92.9 fl (ref 78.0–100.0)
Monocytes Absolute: 0.6 10*3/uL (ref 0.1–1.0)
Monocytes Relative: 10.4 % (ref 3.0–12.0)
Neutro Abs: 2.1 10*3/uL (ref 1.4–7.7)
Neutrophils Relative %: 34.8 % — ABNORMAL LOW (ref 43.0–77.0)
Platelets: 437 10*3/uL — ABNORMAL HIGH (ref 150.0–400.0)
RBC: 4.19 Mil/uL (ref 3.87–5.11)
RDW: 13.4 % (ref 11.5–15.5)
WBC: 6 10*3/uL (ref 4.0–10.5)

## 2021-04-02 LAB — LIPID PANEL
Cholesterol: 141 mg/dL (ref 0–200)
HDL: 43.4 mg/dL (ref >39.00)
LDL Cholesterol: 69 mg/dL (ref 0–99)
NonHDL: 97.45
Total CHOL/HDL Ratio: 3
Triglycerides: 143 mg/dL (ref 0.0–149.0)
VLDL: 28.6 mg/dL (ref 0.0–40.0)

## 2021-04-02 LAB — BASIC METABOLIC PANEL
BUN: 14 mg/dL (ref 6–23)
CO2: 28 mEq/L (ref 19–32)
Calcium: 9.6 mg/dL (ref 8.4–10.5)
Chloride: 101 mEq/L (ref 96–112)
Creatinine, Ser: 0.74 mg/dL (ref 0.40–1.20)
GFR: 88.37 mL/min (ref >60.00)
Glucose, Bld: 83 mg/dL (ref 70–99)
Potassium: 4.6 mEq/L (ref 3.5–5.1)
Sodium: 137 mEq/L (ref 135–145)

## 2021-04-02 LAB — HEPATIC FUNCTION PANEL
ALT: 17 U/L (ref 0–35)
AST: 18 U/L (ref 0–37)
Albumin: 4.1 g/dL (ref 3.5–5.2)
Alkaline Phosphatase: 72 U/L (ref 39–117)
Bilirubin, Direct: 0.1 mg/dL (ref 0.0–0.3)
Total Bilirubin: 0.7 mg/dL (ref 0.2–1.2)
Total Protein: 7.3 g/dL (ref 6.0–8.3)

## 2021-04-02 LAB — TSH: TSH: 1.28 u[IU]/mL (ref 0.35–4.50)

## 2021-09-16 ENCOUNTER — Encounter: Payer: Self-pay | Admitting: Orthopaedic Surgery

## 2021-09-16 ENCOUNTER — Ambulatory Visit: Payer: Self-pay

## 2021-09-16 ENCOUNTER — Ambulatory Visit (INDEPENDENT_AMBULATORY_CARE_PROVIDER_SITE_OTHER): Payer: 59 | Admitting: Orthopaedic Surgery

## 2021-09-16 ENCOUNTER — Other Ambulatory Visit: Payer: Self-pay

## 2021-09-16 VITALS — Ht 65.0 in | Wt 166.4 lb

## 2021-09-16 DIAGNOSIS — M1711 Unilateral primary osteoarthritis, right knee: Secondary | ICD-10-CM | POA: Diagnosis not present

## 2021-09-16 DIAGNOSIS — M25561 Pain in right knee: Secondary | ICD-10-CM | POA: Diagnosis not present

## 2021-09-16 DIAGNOSIS — M25562 Pain in left knee: Secondary | ICD-10-CM | POA: Diagnosis not present

## 2021-09-16 DIAGNOSIS — G8929 Other chronic pain: Secondary | ICD-10-CM

## 2021-09-16 DIAGNOSIS — M1712 Unilateral primary osteoarthritis, left knee: Secondary | ICD-10-CM | POA: Diagnosis not present

## 2021-09-16 NOTE — Progress Notes (Signed)
Office Visit Note   Patient: Laura Wheeler           Date of Birth: [birth year]           MRN: [id number] Visit Date: 09/16/2021              Requested by: Kristian Covey, MD [address] [place],  [place] [address] PCP: Kristian Covey, MD   Assessment & Plan: Visit Diagnoses:  1. Chronic pain of left knee   2. Chronic pain of right knee   3. Unilateral primary osteoarthritis, left knee   4. Unilateral primary osteoarthritis, right knee     Plan: I spoke to the patient in length in detail about knee replacement surgery.  We went over the intraoperative and postoperative course.  We went over her x-rays and it showed her knee replacement model and described in detail what the surgery involves including an assessment of the risk and benefits of surgery.  All questions and concerns were answered and addressed.  She would like to consider repeat steroid injections around early January of this next year.  We will see her then.  If there is any issues before then she knows to let us know.  Follow-Up Instructions: No follow-ups on file.   Orders:  Orders Placed This Encounter  Procedures   XR Knee 1-2 Views Right   XR Knee 1-2 Views Left   No orders of the defined types were placed in this encounter.     Procedures: No procedures performed   Clinical Data: No additional findings.   Subjective: Chief Complaint  Patient presents with   Right Knee - Pain   Left Knee - Pain  The patient comes in today for third opinion as it relates to bilateral knee osteoarthritis.  She has had visits to at least 2 orthopedic surgeons who recommended knee replacement surgery.  She is only 60 years old and very thin and incredibly active.  She does not participate in Pilates but has not been able to do surgery to her knees.  She did have a steroid injection in late August to help her through a cruise and so far that is helped her quite a bit.  She does have a lot of files with  her but have been able to see MRIs of her knees.  She did let us get plain films of both knees today.  She has been having knee pain for about 10 years now with the right worse than left.  At this point her knee pain is detrimentally affecting her mobility, her quality of life and actives daily living.  She has had PRP injections and hyaluronic acid injections as well as steroid injections.  She is wanting to consider knee replacement surgery but not until probably March of next year.  She is a healthy individual otherwise not diabetic.  Again she states her right knee hurts worse than her left.  She does state that her knees are doing well today but were quite angry and swollen prior to getting steroid injections.  HPI  Review of Systems There is currently no headache, chest pain, shortness of breath, fever, chills, nausea, vomiting  Objective: Vital Signs: Ht 5\' 5"  (1.651 m)   Wt 166 lb 6.4 oz (75.5 kg)   LMP 09/20/2014   BMI 27.69 kg/m   Physical Exam She is alert and orient x3 and in no acute distress Ortho Exam Both knees today have excellent range of motion and  her ligaments is stable with no effusion.  Both knees have global tenderness and patellofemoral crepitation. Specialty Comments:  No specialty comments available.  Imaging: XR Knee 1-2 Views Left  Result Date: 09/16/2021 3 views left knee show tricompartment arthritis.  There is irregular joint surface and osteophytes in all 3 compartments.  There is severe patellofemoral narrowing.  XR Knee 1-2 Views Right  Result Date: 09/16/2021 3 views of the right knee show moderate tricompartment arthritis in terms of the medial lateral compartments but severe at the patellofemoral joint.  There is irregular joint surface on all 3 compartments and osteophytes.  The alignment is neutral.    PMFS History: Patient Active Problem List   Diagnosis Date Noted   Primary osteoarthritis of left knee 10/20/2019   Nephrolithiasis 01/15/2016    Palpitations 01/15/2015   Plantar plate injury 93/81/0175   Neuropathic pain of left foot 09/12/2013   WOLFF (WOLFE)-PARKINSON-WHITE (WPW) SYNDROME 09/30/2009   Allergic rhinitis 09/30/2009   Past Medical History:  Diagnosis Date   Arthritis    "knees" (05/19/2013)   History of cardiac radiofrequency ablation 04/2013   Hx of echocardiogram    Echo (12/15):  EF 60-65%, no RWMA, Gr 1 DD   SVT (supraventricular tachycardia) (HCC)    WPW (Wolff-Parkinson-White syndrome) ~ 1983   a. s/p ablation 05/20/13.    Family History  Problem Relation Age of Onset   Diabetes Mother    Depression Mother    Melanoma Mother    Hypothyroidism Mother    Hyperlipidemia Mother    Cancer Father        ?osteosarcoma leg   Hypertension Father    Arthritis Brother    Heart attack Neg Hx     Past Surgical History:  Procedure Laterality Date   ANAL FISSURE REPAIR  1980's   BREAST BIOPSY Left 2000's   EXCISION MORTON'S NEUROMA Left 05/04/2013   Procedure: LEFT SECOND MT  WEIL/SECOND WEBB SPACE NEUROMA EXCISION ;  Surgeon: [name], MD;  Location: [facility] OR;  Service: Orthopedics;  Laterality: Left;   FOOT SURGERY Left 09/2014   hardware removal   SUPRAVENTRICULAR TACHYCARDIA ABLATION  05/19/2013   SUPRAVENTRICULAR TACHYCARDIA ABLATION N/A 05/19/2013   Procedure: SUPRAVENTRICULAR TACHYCARDIA ABLATION;  Surgeon: Marinus Maw, MD;  Location: [facility] CATH LAB;  Service: Cardiovascular;  Laterality: N/A;   Social History   Occupational History    Employer: [employer]    Comment: [employer]  Tobacco Use   Smoking status: Never   Smokeless tobacco: Never  Substance and Sexual Activity   Alcohol use: Yes    Alcohol/week: 4.0 standard drinks    Types: 4 Glasses of wine per week    Comment: 1-2 glasses occassionaly   Drug use: No   Sexual activity: Yes    Birth control/protection: Inserts

## 2021-10-17 ENCOUNTER — Encounter: Payer: Self-pay | Admitting: Family Medicine

## 2021-10-17 MED ORDER — PREDNISONE 20 MG PO TABS: ORAL_TABLET | ORAL | 0 refills | Status: DC

## 2021-10-22 ENCOUNTER — Telehealth: Payer: 59 | Admitting: Family Medicine

## 2021-10-24 ENCOUNTER — Other Ambulatory Visit: Payer: Self-pay

## 2021-10-24 ENCOUNTER — Ambulatory Visit (INDEPENDENT_AMBULATORY_CARE_PROVIDER_SITE_OTHER): Payer: 59 | Admitting: Family Medicine

## 2021-10-24 VITALS — BP 120/60 | HR 110 | Temp 98.8°F | Wt 164.0 lb

## 2021-10-24 DIAGNOSIS — R051 Acute cough: Secondary | ICD-10-CM | POA: Diagnosis not present

## 2021-10-24 MED ORDER — BENZONATATE 100 MG PO CAPS: 100.0000 mg | ORAL_CAPSULE | Freq: Three times a day (TID) | ORAL | 1 refills | Status: DC | PRN

## 2021-10-24 NOTE — Progress Notes (Signed)
Established Patient Office Visit  Subjective:  Patient ID: Laura Wheeler, female    DOB: 1961/09/27  Age: 60 y.o. MRN: [id number]  CC: No chief complaint on file.   HPI Laura Wheeler presents for recent cough and congestion.  She states she gets this usually about 3 times per year usually after Christmas and around May and around this time of year.  She relates several days of congestion and we recently called in some prednisone.  She does feel like this helped some.  She coughed up a fairly large glob of mucus earlier today and has felt like her breathing has been some better since then.  She takes Symbicort which was prescribed by pulmonary and uses that as a rescue inhaler.  No recent fever.  No hemoptysis.  Requesting refill of Tessalon.  Past Medical History:  Diagnosis Date   Arthritis    "knees" (05/19/2013)   History of cardiac radiofrequency ablation 04/2013   Hx of echocardiogram    Echo (12/15):  EF 60-65%, no RWMA, Gr 1 DD   SVT (supraventricular tachycardia) (HCC)    WPW (Wolff-Parkinson-White syndrome) ~ 1983   a. s/p ablation 05/20/13.    Past Surgical History:  Procedure Laterality Date   ANAL FISSURE REPAIR  1980's   BREAST BIOPSY Left 2000's   EXCISION MORTON'S NEUROMA Left 05/04/2013   Procedure: LEFT SECOND MT  WEIL/SECOND WEBB SPACE NEUROMA EXCISION ;  Surgeon: [name], MD;  Location: [facility] OR;  Service: Orthopedics;  Laterality: Left;   FOOT SURGERY Left 09/2014   hardware removal   SUPRAVENTRICULAR TACHYCARDIA ABLATION  05/19/2013   SUPRAVENTRICULAR TACHYCARDIA ABLATION N/A 05/19/2013   Procedure: SUPRAVENTRICULAR TACHYCARDIA ABLATION;  Surgeon: Marinus Maw, MD;  Location: [facility] CATH LAB;  Service: Cardiovascular;  Laterality: N/A;    Family History  Problem Relation Age of Onset   Diabetes Mother    Depression Mother    Melanoma Mother    Hypothyroidism Mother    Hyperlipidemia Mother    Cancer Father        ?osteosarcoma leg   Hypertension  Father    Arthritis Brother    Heart attack Neg Hx     Social History   Socioeconomic History   Marital status: Married    Spouse name: Loraine Leriche   Number of children: 2   Years of education: BS   Highest education level: Bachelor's degree (e.g., BA, AB, BS)  Occupational History    Employer: [employer]    Comment: [employer]  Tobacco Use   Smoking status: Never   Smokeless tobacco: Never  Substance and Sexual Activity   Alcohol use: Yes    Alcohol/week: 4.0 standard drinks    Types: 4 Glasses of wine per week    Comment: 1-2 glasses occassionaly   Drug use: No   Sexual activity: Yes    Birth control/protection: Inserts  Other Topics Concern   Not on file  Social History Narrative   Patient is married and lives at home with her husband.     New job in 2017, [employer]   No regular exercise - diet is ok.   Social Determinants of Health   Financial Resource Strain: Low Risk    Difficulty of Paying Living Expenses: Not hard at all  Food Insecurity: No Food Insecurity   Worried About Programme researcher, broadcasting/film/video in the Last Year: Never true   Ran Out of Food in the Last Year: Never true  Transportation  Needs: No Transportation Needs   Lack of Transportation (Medical): No   Lack of Transportation (Non-Medical): No  Physical Activity: Sufficiently Active   Days of Exercise per Week: 5 days   Minutes of Exercise per Session: 50 min  Stress: No Stress Concern Present   Feeling of Stress : Only a little  Social Connections: Press photographer of Communication with Friends and Family: More than three times a week   Frequency of Social Gatherings with Friends and Family: Three times a week   Attends Religious Services: More than 4 times per year   Active Member of Clubs or Organizations: Yes   Attends Banker Meetings: More than 4 times per year   Marital Status: Married  Catering manager Violence: Not on file    Outpatient Medications  Prior to Visit  Medication Sig Dispense Refill   Azelastine-Fluticasone 137-50 MCG/ACT SUSP      Calcium Carb-Cholecalciferol (CALCIUM + D3 PO) Take 600 mg by mouth 2 (two) times daily.     levocetirizine (XYZAL) 5 MG tablet Take 5 mg by mouth at bedtime.     MELOXICAM PO Take by mouth.     Multiple Vitamin (MULTIVITAMINS PO) Take by mouth.     OVER THE COUNTER MEDICATION Shackley supplement for joints     OVER THE COUNTER MEDICATION Shackley supplement for vitamin C     predniSONE (DELTASONE) 20 MG tablet Take 2 tablets by mouth, once a day for 5 days 10 tablet 0   SYMBICORT 80-4.5 MCG/ACT inhaler Inhale into the lungs.     ciprofloxacin (CIPRO) 500 MG tablet Take 1 tablet (500 mg total) by mouth 2 (two) times daily. 14 tablet 0   predniSONE (DELTASONE) 20 MG tablet PLEASE SEE ATTACHED FOR DETAILED DIRECTIONS     No facility-administered medications prior to visit.    Allergies  Allergen Reactions   Sulfa Antibiotics Itching    Doesn't remember reaction - 20 years ago    ROS Review of Systems  Constitutional:  Negative for chills and fever.  HENT:  Positive for congestion.   Respiratory:  Positive for cough. Negative for shortness of breath and wheezing.      Objective:    Physical Exam Vitals reviewed.  Constitutional:      Appearance: Normal appearance.  Cardiovascular:     Rate and Rhythm: Normal rate and regular rhythm.  Pulmonary:     Effort: Pulmonary effort is normal.     Breath sounds: Normal breath sounds. No wheezing or rales.  Neurological:     Mental Status: She is alert.    BP 120/60 (BP Location: Left Arm, Patient Position: Sitting, Cuff Size: Normal)   Pulse (!) 110   Temp 98.8 F (37.1 C) (Oral)   Wt 164 lb (74.4 kg)   LMP 09/20/2014   SpO2 96%   BMI 27.29 kg/m  Wt Readings from Last 3 Encounters:  10/24/21 164 lb (74.4 kg)  09/16/21 166 lb 6.4 oz (75.5 kg)  03/25/21 166 lb 3.2 oz (75.4 kg)     Health Maintenance Due  Topic Date Due    MAMMOGRAM  09/04/2021   PAP SMEAR-Modifier  09/20/2021    There are no preventive care reminders to display for this patient.  Lab Results  Component Value Date   TSH 1.28 04/02/2021   Lab Results  Component Value Date   WBC 6.0 04/02/2021   HGB 13.1 04/02/2021   HCT 39.0 04/02/2021   MCV 92.9 04/02/2021  PLT 437.0 (H) 04/02/2021   Lab Results  Component Value Date   NA 137 04/02/2021   K 4.6 04/02/2021   CO2 28 04/02/2021   GLUCOSE 83 04/02/2021   BUN 14 04/02/2021   CREATININE 0.74 04/02/2021   BILITOT 0.7 04/02/2021   ALKPHOS 72 04/02/2021   AST 18 04/02/2021   ALT 17 04/02/2021   PROT 7.3 04/02/2021   ALBUMIN 4.1 04/02/2021   CALCIUM 9.6 04/02/2021   GFR 88.37 04/02/2021   Lab Results  Component Value Date   CHOL 141 04/02/2021   Lab Results  Component Value Date   HDL 43.40 04/02/2021   Lab Results  Component Value Date   LDLCALC 69 04/02/2021   Lab Results  Component Value Date   TRIG 143.0 04/02/2021   Lab Results  Component Value Date   CHOLHDL 3 04/02/2021   Lab Results  Component Value Date   HGBA1C 5.5 03/02/2019      Assessment & Plan:   Recent cough.  Suspect acute bronchial illness.  Unremarkable lung exam at this time.  Does not any red flags such as hemoptysis, dyspnea, fever, etc.  -Refill Tessalon Perles to use as needed -Continue Symbicort as needed  Meds ordered this encounter  Medications   benzonatate (TESSALON PERLES) 100 MG capsule    Sig: Take 1 capsule (100 mg total) by mouth 3 (three) times daily as needed for cough.    Dispense:  30 capsule    Refill:  1    Follow-up: No follow-ups on file.    Evelena Peat, MD

## 2021-12-05 ENCOUNTER — Telehealth: Payer: Self-pay | Admitting: Orthopaedic Surgery

## 2021-12-05 NOTE — Telephone Encounter (Signed)
Called to r/s with blackman

## 2022-01-06 ENCOUNTER — Ambulatory Visit: Payer: 59 | Admitting: Orthopaedic Surgery

## 2022-01-07 ENCOUNTER — Ambulatory Visit (INDEPENDENT_AMBULATORY_CARE_PROVIDER_SITE_OTHER): Payer: 59 | Admitting: Orthopaedic Surgery

## 2022-01-07 ENCOUNTER — Other Ambulatory Visit: Payer: Self-pay

## 2022-01-07 ENCOUNTER — Encounter: Payer: Self-pay | Admitting: Orthopaedic Surgery

## 2022-01-07 DIAGNOSIS — M1711 Unilateral primary osteoarthritis, right knee: Secondary | ICD-10-CM

## 2022-01-07 DIAGNOSIS — G8929 Other chronic pain: Secondary | ICD-10-CM

## 2022-01-07 DIAGNOSIS — M1712 Unilateral primary osteoarthritis, left knee: Secondary | ICD-10-CM | POA: Diagnosis not present

## 2022-01-07 DIAGNOSIS — M25562 Pain in left knee: Secondary | ICD-10-CM | POA: Diagnosis not present

## 2022-01-07 DIAGNOSIS — M25561 Pain in right knee: Secondary | ICD-10-CM

## 2022-01-07 MED ORDER — LIDOCAINE HCL 1 % IJ SOLN
3.0000 mL | INTRAMUSCULAR | Status: AC | PRN
  Administered 2022-01-07: 3 mL

## 2022-01-07 MED ORDER — METHYLPREDNISOLONE ACETATE 40 MG/ML IJ SUSP
40.0000 mg | INTRAMUSCULAR | Status: AC | PRN
  Administered 2022-01-07: 40 mg via INTRA_ARTICULAR

## 2022-01-07 NOTE — Progress Notes (Signed)
Office Visit Note   Patient: Laura Wheeler           Date of Birth: [birth year]           MRN: [id number] Visit Date: 01/07/2022              Requested by: Eulas Post, MD [place],  [place] [id number] PCP: Eulas Post, MD   Assessment & Plan: Visit Diagnoses:  1. Chronic pain of left knee   2. Chronic pain of right knee   3. Unilateral primary osteoarthritis, left knee   4. Unilateral primary osteoarthritis, right knee     Plan: Per the patient's request I did place steroid injections in both knees today.  We will work on getting her set up for a right total knee arthroplasty.  I did discuss the risk and benefits of the surgery with expect from an intraoperative and postoperative course.  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  We will work on getting her scheduled for surgery in early March is her preferred date which I think is reasonable as well.  This will be for the right knee.  Follow-Up Instructions: Return for 2 weeks post-op.   Orders:  Orders Placed This Encounter  Procedures   Large Joint Inj   Large Joint Inj   No orders of the defined types were placed in this encounter.     Procedures: Large Joint Inj: R knee on 01/07/2022 8:52 AM Indications: diagnostic evaluation and pain Details: 22 G 1.5 in needle, superolateral approach  Arthrogram: No  Medications: 3 mL lidocaine 1 %; 40 mg methylPREDNISolone acetate 40 MG/ML Outcome: tolerated well, no immediate complications Procedure, treatment alternatives, risks and benefits explained, specific risks discussed. Consent was given by the patient. Immediately prior to procedure a time out was called to verify the correct patient, procedure, equipment, support staff and site/side marked as  required. Patient was prepped and draped in the usual sterile fashion.    Large Joint Inj: L knee on 01/07/2022 8:52 AM Indications: diagnostic evaluation and pain Details: 22 G 1.5 in needle, superolateral approach  Arthrogram: No  Medications: 3 mL lidocaine 1 %; 40 mg methylPREDNISolone acetate 40 MG/ML Outcome: tolerated well, no immediate complications Procedure, treatment alternatives, risks and benefits explained, specific risks discussed. Consent was given by the patient. Immediately prior to procedure a time out was called to verify the correct patient, procedure, equipment, support staff and site/side marked as required. Patient was prepped and draped in the usual sterile fashion.      Clinical Data: No additional findings.   Subjective: Chief Complaint  Patient presents with   Right Knee - Pain   Left Knee - Pain  The patient is a very pleasant 61 year old female who comes in with known osteoarthritis of both her knees.  We have provided steroid injections in her knees before and she would like them in both knees today.  She also is ready to discuss knee replacement surgery in detail.  We have discussed this with her in the past.  At this point her bilateral knee pain is definitely affecting her mobility, her quality of life and actives daily living.  She has failed conservative treatment for over 12 months.  She does wish  to proceed with her knee replacement on both knees but I did let her know that I am not comfortable with proceeding with bilateral knee replacements given the higher complication rate.  Without being said she would like to have a right knee replaced first because that is the more symptomatic knee although both knees hurt her quite a bit.  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  She had a history of Wolff-Parkinson-White syndrome in the past but that was cured with an ablation.  She has no other significant active medical  issues.  HPI  Review of Systems There is currently listed no headache, chest pain, shortness of breath, fever, chills, nausea, vomiting  Objective: Vital Signs: LMP 09/20/2014   Physical Exam She is alert and orient x3 and in no acute distress Ortho Exam Examination of both knees shows slight varus malalignment and medial lateral joint line tenderness with patellofemoral crepitation.  She has more problems with motion on the right knee than left. Specialty Comments:  No specialty comments available.  Imaging: No results found. Previous x-rays show tricompartmental arthritis of both knees.  PMFS History: Patient Active Problem List   Diagnosis Date Noted   Primary osteoarthritis of left knee 10/20/2019   Nephrolithiasis 01/15/2016   Palpitations 01/15/2015   Plantar plate injury [id number]   Neuropathic pain of left foot 09/12/2013   WOLFF (WOLFE)-PARKINSON-WHITE (WPW) SYNDROME 09/30/2009   Allergic rhinitis 09/30/2009   Past Medical History:  Diagnosis Date   Arthritis    "knees" (05/19/2013)   History of cardiac radiofrequency ablation 04/2013   Hx of echocardiogram    Echo (12/15):  EF 60-65%, no RWMA, Gr 1 DD   SVT (supraventricular tachycardia) (HCC)    WPW (Wolff-Parkinson-White syndrome) ~ 1983   a. s/p ablation 05/20/13.    Family History  Problem Relation Age of Onset   Diabetes Mother    Depression Mother    Melanoma Mother    Hypothyroidism Mother    Hyperlipidemia Mother    Cancer Father        ?osteosarcoma leg   Hypertension Father    Arthritis Brother    Heart attack Neg Hx     Past Surgical History:  Procedure Laterality Date   ANAL FISSURE REPAIR  1980's   BREAST BIOPSY Left 2000's   EXCISION MORTON'S NEUROMA Left 05/04/2013   Procedure: LEFT SECOND MT  WEIL/SECOND WEBB SPACE NEUROMA EXCISION ;  Surgeon: Wylene Simmer, MD;  Location: [facility];  Service: Orthopedics;  Laterality: Left;   FOOT SURGERY Left 09/2014   hardware removal    SUPRAVENTRICULAR TACHYCARDIA ABLATION  05/19/2013   SUPRAVENTRICULAR TACHYCARDIA ABLATION N/A 05/19/2013   Procedure: SUPRAVENTRICULAR TACHYCARDIA ABLATION;  Surgeon: Evans Lance, MD;  Location: [facility] CATH LAB;  Service: Cardiovascular;  Laterality: N/A;   Social History   Occupational History    Employer: [employer]    Comment: [place]  Tobacco Use   Smoking status: Never   Smokeless tobacco: Never  Substance and Sexual Activity   Alcohol use: Yes    Alcohol/week: 4.0 standard drinks    Types: 4 Glasses of wine per week    Comment: 1-2 glasses occassionaly   Drug use: No   Sexual activity: Yes    Birth control/protection: Inserts

## 2022-02-02 ENCOUNTER — Other Ambulatory Visit: Payer: Self-pay

## 2022-02-20 ENCOUNTER — Telehealth: Payer: Self-pay | Admitting: Orthopaedic Surgery

## 2022-02-20 NOTE — Telephone Encounter (Signed)
[employer] forms received. To [employer]. 

## 2022-02-23 ENCOUNTER — Other Ambulatory Visit: Payer: Self-pay | Admitting: Physician Assistant

## 2022-02-23 DIAGNOSIS — M1711 Unilateral primary osteoarthritis, right knee: Secondary | ICD-10-CM

## 2022-03-05 NOTE — Pre-Procedure Instructions (Signed)
Surgical Instructions ? ? ? Your procedure is scheduled on Tuesday, March 21. ? Report to [facility] Main Entrance "A" at 11:45 A.M., then check in with the Admitting office. ? Call this number if you have problems the morning of surgery: ? [phone] ? ? If you have any questions prior to your surgery date call [phone]: Open Monday-Friday 8am-4pm ? ? ? Remember: ? Do not eat after midnight the night before your surgery ? ?You may drink clear liquids until 10:45AM the morning of your surgery.   ?Clear liquids allowed are: Water, Non-Citrus Juices (without pulp), Carbonated Beverages, Clear Tea, Black Coffee ONLY (NO MILK, CREAM OR POWDERED CREAMER of any kind), and Gatorade ? ?Patient Instructions ? ?The night before surgery:  ?No food after midnight. ONLY clear liquids after midnight ? ?The day of surgery (if you do NOT have diabetes):  ?Drink ONE (1) Pre-Surgery Clear Ensure by 10:45AM the morning of surgery. Drink in one sitting. Do not sip.  ?This drink was given to you during your hospital  ?pre-op appointment visit. ? ?Nothing else to drink after completing the  ?Pre-Surgery Clear Ensure. ? ?       If you have questions, please contact your surgeon?s office. ? ?  ? Take these medicines the morning of surgery with A SIP OF WATER:  ? ?Azelastine-Fluticasone 137-50 MCG/ACT SUSP ?SYMBICORT 80-4.5 MCG/ACT inhaler if needed- please bring inhalers to the hospital with you ? ?As of today, STOP taking any Aspirin (unless otherwise instructed by your surgeon), Meloxicam (Mobic), Aleve, Naproxen, Ibuprofen, Motrin, Advil, Goody's, BC's, all herbal medications, fish oil, and all vitamins. ? ?         ?Do not wear jewelry or makeup ?Do not wear lotions, powders, perfumes/colognes, or deodorant. ?Do not shave 48 hours prior to surgery.  Men may shave face and neck. ?Do not bring valuables to the hospital. ?Do not wear nail polish, gel polish, artificial nails, or any other type of covering on natural nails (fingers  and toes) ?If you have artificial nails or gel coating that need to be removed by a nail salon, please have this removed prior to surgery. Artificial nails or gel coating may interfere with anesthesia's ability to adequately monitor your vital signs. ? ?[facility] is not responsible for any belongings or valuables. .  ? ?Do NOT Smoke (Tobacco/Vaping)  24 hours prior to your procedure ? ?If you use a CPAP at night, you may bring your mask for your overnight stay. ?  ?Contacts, glasses, hearing aids, dentures or partials may not be worn into surgery, please bring cases for these belongings ?  ?For patients admitted to the hospital, discharge time will be determined by your treatment team. ?  ?Patients discharged the day of surgery will not be allowed to drive home, and someone needs to stay with them for 24 hours. ? ?NO VISITORS WILL BE ALLOWED IN PRE-OP WHERE PATIENTS ARE PREPPED FOR SURGERY.  ONLY 1 SUPPORT PERSON MAY BE PRESENT IN THE WAITING ROOM WHILE YOU ARE IN SURGERY.  IF YOU ARE TO BE ADMITTED, ONCE YOU ARE IN YOUR ROOM YOU WILL BE ALLOWED TWO (2) VISITORS. 1 (ONE) VISITOR MAY STAY OVERNIGHT BUT MUST ARRIVE TO THE ROOM BY 8pm.  Minor children may have two parents present. Special consideration for safety and communication needs will be reviewed on a case by case basis. ? ?Special instructions:   ? ?Oral Hygiene is also important to reduce your risk of infection.  Remember -  BRUSH YOUR TEETH THE MORNING OF SURGERY WITH YOUR REGULAR TOOTHPASTE ? ? ?[facility]- Preparing For Surgery ? ?Before surgery, you can play an important role. Because skin is not sterile, your skin needs to be as free of germs as possible. You can reduce the number of germs on your skin by washing with CHG (chlorahexidine gluconate) Soap before surgery.  CHG is an antiseptic cleaner which kills germs and bonds with the skin to continue killing germs even after washing.   ? ? ?Please do not use if you have an allergy to CHG or  antibacterial soaps. If your skin becomes reddened/irritated stop using the CHG.  ?Do not shave (including legs and underarms) for at least 48 hours prior to first CHG shower. It is OK to shave your face. ? ?Please follow these instructions carefully. ?  ? ? Shower the NIGHT BEFORE SURGERY and the MORNING OF SURGERY with CHG Soap.  ? If you chose to wash your hair, wash your hair first as usual with your normal shampoo. After you shampoo, rinse your hair and body thoroughly to remove the shampoo.  Then [employer] and genitals (private parts) with your normal soap and rinse thoroughly to remove soap. ? ?After that Use CHG Soap as you would any other liquid soap. You can apply CHG directly to the skin and wash gently with a scrungie or a clean washcloth.  ? ?Apply the CHG Soap to your body ONLY FROM THE NECK DOWN.  Do not use on open wounds or open sores. Avoid contact with your eyes, ears, mouth and genitals (private parts). Wash Face and genitals (private parts)  with your normal soap.  ? ?Wash thoroughly, paying special attention to the area where your surgery will be performed. ? ?Thoroughly rinse your body with warm water from the neck down. ? ?DO NOT shower/wash with your normal soap after using and rinsing off the CHG Soap. ? ?Pat yourself dry with a CLEAN TOWEL. ? ?Wear CLEAN PAJAMAS to bed the night before surgery ? ?Place CLEAN SHEETS on your bed the night before your surgery ? ?DO NOT SLEEP WITH PETS. ? ? ?Day of Surgery: ? ?Take a shower with CHG soap. ?Wear Clean/Comfortable clothing the morning of surgery ?Do not apply any deodorants/lotions.   ?Remember to brush your teeth WITH YOUR REGULAR TOOTHPASTE. ? ? ? ?COVID testing ? ?If you are going to stay overnight or be admitted after your procedure/surgery and require a pre-op COVID test, please follow these instructions after your COVID test  ? ?You are not required to quarantine however you are required to wear a well-fitting mask when you are out and  around people not in your household.  If your mask becomes wet or soiled, replace with a new one. ? ?Wash your hands often with soap and water for 20 seconds or clean your hands with an alcohol-based hand sanitizer that contains at least 60% alcohol. ? ?Do not share personal items. ? ?Notify your provider: ?if you are in close contact with someone who has COVID  ?or if you develop a fever of 100.4 or greater, sneezing, cough, sore throat, shortness of breath or body aches. ? ?  ?Please read over the following fact sheets that you were given.  ? ?

## 2022-03-06 ENCOUNTER — Encounter (HOSPITAL_COMMUNITY)
Admission: RE | Admit: 2022-03-06 | Discharge: 2022-03-06 | Disposition: A | Discharge status: Home / Self Care | Payer: 59 | Source: Ambulatory Visit | Attending: Orthopaedic Surgery | Admitting: Orthopaedic Surgery

## 2022-03-06 ENCOUNTER — Other Ambulatory Visit: Payer: Self-pay

## 2022-03-06 ENCOUNTER — Encounter (HOSPITAL_COMMUNITY): Payer: Self-pay

## 2022-03-06 VITALS — BP 123/72 | HR 76 | Temp 98.3°F | Resp 17 | Ht 66.0 in | Wt 165.3 lb

## 2022-03-06 DIAGNOSIS — M1711 Unilateral primary osteoarthritis, right knee: Secondary | ICD-10-CM | POA: Insufficient documentation

## 2022-03-06 DIAGNOSIS — I2510 Atherosclerotic heart disease of native coronary artery without angina pectoris: Secondary | ICD-10-CM | POA: Insufficient documentation

## 2022-03-06 DIAGNOSIS — Z01818 Encounter for other preprocedural examination: Secondary | ICD-10-CM | POA: Diagnosis present

## 2022-03-06 DIAGNOSIS — I456 Pre-excitation syndrome: Secondary | ICD-10-CM | POA: Diagnosis not present

## 2022-03-06 DIAGNOSIS — I471 Supraventricular tachycardia: Secondary | ICD-10-CM | POA: Insufficient documentation

## 2022-03-06 HISTORY — DX: Bronchitis, not specified as acute or chronic: J40

## 2022-03-06 LAB — BASIC METABOLIC PANEL
Anion gap: 9 (ref 5–15)
BUN: 15 mg/dL (ref 6–20)
CO2: 26 mmol/L (ref 22–32)
Calcium: 9.6 mg/dL (ref 8.9–10.3)
Chloride: 102 mmol/L (ref 98–111)
Creatinine, Ser: 0.71 mg/dL (ref 0.44–1.00)
GFR, Estimated: >60 mL/min (ref >60)
Glucose, Bld: 95 mg/dL (ref 70–99)
Potassium: 4.2 mmol/L (ref 3.5–5.1)
Sodium: 137 mmol/L (ref 135–145)

## 2022-03-06 LAB — CBC
HCT: 39.7 % (ref 36.0–46.0)
Hemoglobin: 13.3 g/dL (ref 12.0–15.0)
MCH: 31.3 pg (ref 26.0–34.0)
MCHC: 33.5 g/dL (ref 30.0–36.0)
MCV: 93.4 fL (ref 80.0–100.0)
Platelets: 361 10*3/uL (ref 150–400)
RBC: 4.25 MIL/uL (ref 3.87–5.11)
RDW: 13.7 % (ref 11.5–15.5)
WBC: 8.7 10*3/uL (ref 4.0–10.5)
nRBC: 0 % (ref 0.0–0.2)

## 2022-03-06 LAB — SURGICAL PCR SCREEN
MRSA, PCR: NEGATIVE
Staphylococcus aureus: POSITIVE — AB

## 2022-03-06 LAB — TYPE AND SCREEN
ABO/RH(D): A NEG
Antibody Screen: NEGATIVE

## 2022-03-06 NOTE — Progress Notes (Signed)
PCP - Dr. Carolann Littler ?Cardiologist/EP-Dr. Rayann Heman ? ?PPM/ICD - n/a ? ?Chest x-ray - n/a ?EKG - 03/06/22 ?Stress Test - 30+ years ago? Initial diagnosis of Wolff-Parkinson White Syndrome  ?ECHO - 12/18/14 ?Cardiac Cath - denies ? ?Sleep Study - denies ?CPAP - denies ? ?Blood Thinner Instructions:n/a ?Aspirin Instructions: n/a ? ?ERAS Protcol -CLear liquids until 1045 DOS ?PRE-SURGERY Ensure or G2- (1) Ensure provided  ? ?COVID TEST- n/a ? ?Anesthesia review: Yes, cardiac hx of Wolff-Parkinson White Syndrome.  ? ?Patient denies shortness of breath, fever, cough and chest pain at PAT appointment ? ? ?All instructions explained to the patient, with a verbal understanding of the material. Patient agrees to go over the instructions while at home for a better understanding. Patient also instructed to self quarantine after being tested for COVID-19. The opportunity to ask questions was provided. ? ? ?

## 2022-03-09 NOTE — Progress Notes (Signed)
Anesthesia Chart Review: ? Case: [id number] Date/Time: 03/10/22 1330  ? Procedure: Right TOTAL KNEE ARTHROPLASTY (Right: Knee)  ? Anesthesia type: Choice  ? Pre-op diagnosis: Osteoarthritis / Degenerative Joint Diease Right Knee  ? [facility]: [facility] ROOM 07 / [facility]  ? Surgeons: Mcarthur Rossetti, MD  ? ?  ? ? ?DISCUSSION: Patient is a 61 year old female scheduled for the above procedure. ? ?History includes never smoker, SVT/WPW (diagnosed ~ 1983; s/p ablation 05/19/13), osteoarthritis. ? ?EKG showed normal sinus rhythm.  She denied chest pain and shortness of breath at PAT RN visit.  Anesthesia team to evaluate on the day of surgery. ? ? ?VS: BP 123/72   Pulse 76   Temp 36.8 ?C (Oral)   Resp 17   Ht 5\' 6"  (1.676 m)   Wt 75 kg   LMP 09/20/2014   SpO2 100%   BMI 26.68 kg/m?  ? ? ?PROVIDERS: ?Eulas Post, MD is PCP  ?Cristopher Peru, MD is EP cardiologist. Last visit 01/15/15. No evidence of recurrent WPW on EKG our cardiac monitor. Palpitations controlled at that time. If becomes bothersome in the future, could consider adding b-blocker.  ? ? ?LABS: Labs reviewed: Acceptable for surgery. ?(all labs ordered are listed, but only abnormal results are displayed) ? ?Labs Reviewed  ?SURGICAL PCR SCREEN - Abnormal; Notable for the following components:  ?    Result Value  ? Staphylococcus aureus POSITIVE (*)   ? All other components within normal limits  ?BASIC METABOLIC PANEL  ?CBC  ?TYPE AND SCREEN  ? ? ? ?IMAGES: ?Xray bilateral knee 3V 12/25/20 ([facility]) ?IMPRESSION:  ?1.  Bilateral knee osteoarthritis.  ?2.  Mild left genu varus.  ?3.  Subtle chondroid matrix calcification in the distal femoral metaphysis,  ?most consistent with a benign enchondroma.  ? ? ?EKG: 03/06/22: ?Normal sinus rhythm ?Normal ECG ?When compared with ECG of 28-Jan-2016 16:15, ?PREVIOUS ECG IS PRESENT ?since last tracing no significant change ?Confirmed by Larae Grooms [phone]) on 03/06/2022 9:24:29 AM ? ? ?CV: ?Echo 12/18/14: ?Study  Conclusions  ?- Left ventricle: The cavity size was normal. Wall thickness was  ?  normal. Systolic function was normal. The estimated ejection  ?  fraction was in the range of 60% to 65%. Wall motion was normal;  ?  there were no regional wall motion abnormalities. Doppler  ?  parameters are consistent with abnormal left ventricular  ?  relaxation (grade 1 diastolic dysfunction).   ? ?48 Hour Holter monitor 12/18/14: ?Predominant rhythm: Sinus. ?Questionable triplet PVC versus artifact.  Some multiform PVCs may be supraventricular with a variant conduction.  3 atrial runs and some baseline artifact.  No ectopic Tiffany is during diary entries. ?Ventricular complexes represent 1.77% of total.  SVE complexes represent 0.01% of total.  Longest pause 1.15 seconds at 10:31 PM. ? ?She reported a remote history of stress test at the time of her initial diagnosis of WPW. ? ? ?Past Surgical History:  ?Procedure Laterality Date  ? ANAL FISSURE REPAIR  1980's  ? BREAST BIOPSY Left 2000's  ? EXCISION MORTON'S NEUROMA Left 05/04/2013  ? Procedure: LEFT SECOND MT  WEIL/SECOND WEBB SPACE NEUROMA EXCISION ;  Surgeon: Wylene Simmer, MD;  Location: [place];  Service: Orthopedics;  Laterality: Left;  ? FOOT SURGERY Left 09/2014  ? hardware removal  ? SUPRAVENTRICULAR TACHYCARDIA ABLATION  05/19/2013  ? SUPRAVENTRICULAR TACHYCARDIA ABLATION N/A 05/19/2013  ? Procedure: SUPRAVENTRICULAR TACHYCARDIA ABLATION;  Surgeon: Evans Lance, MD;  Location: [facility]  CATH LAB;  Service: Cardiovascular;  Laterality: N/A;  ? ? ?MEDICATIONS: ? Azelastine-Fluticasone 137-50 MCG/ACT SUSP  ? EPINEPHrine 0.3 mg/0.3 mL IJ SOAJ injection  ? levocetirizine (XYZAL) 5 MG tablet  ? MELOXICAM PO  ? Multiple Vitamin (MULTIVITAMINS PO)  ? OVER THE COUNTER MEDICATION  ? OVER THE COUNTER MEDICATION  ? OVER THE COUNTER MEDICATION  ? Protein POWD  ? SYMBICORT 80-4.5 MCG/ACT inhaler  ? TURMERIC PO  ? valACYclovir (VALTREX) 500 MG tablet  ? ?No current facility-administered  medications for this encounter.  ? ? ?Myra Gianotti, PA-C ?Surgical Short Stay/Anesthesiology ?[facility] Phone [phone] ?[facility] Phone [phone] ?03/09/2022 11:13 AM ? ? ? ? ? ? ?

## 2022-03-09 NOTE — Anesthesia Preprocedure Evaluation (Addendum)
Anesthesia Evaluation  ?Patient identified by MRN, date of birth, ID band ?Patient awake ? ? ? ?Reviewed: ?Allergy & Precautions, NPO status , Patient's Chart, lab work & pertinent test results ? ?Airway ?Mallampati: II ? ?TM Distance: >3 FB ?Neck ROM: Full ? ? ? Dental ?no notable dental hx. ? ?  ?Pulmonary ?neg pulmonary ROS,  ?  ?Pulmonary exam normal ?breath sounds clear to auscultation ? ? ? ? ? ? Cardiovascular ?Exercise Tolerance: Good ?Normal cardiovascular exam+ dysrhythmias Supra Ventricular Tachycardia  ?Rhythm:Regular Rate:Normal ? ?EKG: 03/06/22: ?Normal sinus rhythm ?Normal ECG ?When compared with ECG of 28-Jan-2016 16:15, ?PREVIOUS ECG IS PRESENT ?since last tracing no significant change ?Confirmed by Larae Grooms [phone]) on 03/06/2022 9:24:29 AM ?? ?  ?Neuro/Psych ?negative neurological ROS ? negative psych ROS  ? GI/Hepatic ?negative GI ROS, Neg liver ROS,   ?Endo/Other  ?negative endocrine ROS ? Renal/GU ?Renal disease (kidney stones)  ?negative genitourinary ?  ?Musculoskeletal ? ?(+) Arthritis ,  ? Abdominal ?  ?Peds ?negative pediatric ROS ?(+)  Hematology ?negative hematology ROS ?(+)   ?Anesthesia Other Findings ? ? Reproductive/Obstetrics ?negative OB ROS ? ?  ? ? ? ? ? ? ? ? ? ? ? ? ? ?  ?  ? ? ? ? ? ? ? ?Anesthesia Physical ?Anesthesia Plan ? ?ASA: 2 ? ?Anesthesia Plan: Regional, MAC and Spinal  ? ?Post-op Pain Management: Tylenol PO (pre-op)* and Minimal or no pain anticipated  ? ?Induction: Intravenous ? ?PONV Risk Score and Plan: 2 and Treatment may vary due to age or medical condition, Ondansetron and Propofol infusion ? ?Airway Management Planned: Natural Airway and Simple Face Mask ? ?Additional Equipment: None ? ?Intra-op Plan:  ? ?Post-operative Plan:  ? ?Informed Consent: I have reviewed the patients History and Physical, chart, labs and discussed the procedure including the risks, benefits and alternatives for the proposed anesthesia with the  patient or authorized representative who has indicated his/her understanding and acceptance.  ? ? ? ? ? ?Plan Discussed with: CRNA, Anesthesiologist and Surgeon ? ?Anesthesia Plan Comments: (Adductor canal block. Spinal. GA/LMA as backup plan Norton Blizzard, MD  ? ?)  ? ? ? ? ?Anesthesia Quick Evaluation ? ?

## 2022-03-10 ENCOUNTER — Other Ambulatory Visit: Payer: Self-pay

## 2022-03-10 ENCOUNTER — Ambulatory Visit (HOSPITAL_BASED_OUTPATIENT_CLINIC_OR_DEPARTMENT_OTHER): Payer: 59 | Admitting: Anesthesiology

## 2022-03-10 ENCOUNTER — Observation Stay (HOSPITAL_COMMUNITY)
Admission: RE | Admit: 2022-03-10 | Discharge: 2022-03-12 | Disposition: A | Discharge status: Home Health | Payer: 59 | Attending: Orthopaedic Surgery | Admitting: Orthopaedic Surgery

## 2022-03-10 ENCOUNTER — Encounter (HOSPITAL_COMMUNITY)
Admission: RE | Disposition: A | Discharge status: Home Health | Payer: Self-pay | Source: Home / Self Care | Attending: Orthopaedic Surgery

## 2022-03-10 ENCOUNTER — Ambulatory Visit (HOSPITAL_COMMUNITY): Payer: 59

## 2022-03-10 ENCOUNTER — Encounter (HOSPITAL_COMMUNITY): Payer: Self-pay | Admitting: Orthopaedic Surgery

## 2022-03-10 ENCOUNTER — Ambulatory Visit (HOSPITAL_COMMUNITY): Payer: 59 | Admitting: Vascular Surgery

## 2022-03-10 DIAGNOSIS — Z96651 Presence of right artificial knee joint: Secondary | ICD-10-CM

## 2022-03-10 DIAGNOSIS — M1711 Unilateral primary osteoarthritis, right knee: Secondary | ICD-10-CM | POA: Diagnosis not present

## 2022-03-10 DIAGNOSIS — M1990 Unspecified osteoarthritis, unspecified site: Secondary | ICD-10-CM | POA: Diagnosis present

## 2022-03-10 DIAGNOSIS — I456 Pre-excitation syndrome: Secondary | ICD-10-CM | POA: Diagnosis not present

## 2022-03-10 HISTORY — PX: TOTAL KNEE ARTHROPLASTY: SHX125

## 2022-03-10 LAB — ABO/RH: ABO/RH(D): A NEG

## 2022-03-10 SURGERY — ARTHROPLASTY, KNEE, TOTAL
Anesthesia: Monitor Anesthesia Care | Site: Knee | Laterality: Right

## 2022-03-10 MED ORDER — FENTANYL CITRATE (PF) 100 MCG/2ML IJ SOLN
INTRAMUSCULAR | Status: AC
Start: 2022-03-10 — End: 2022-03-11
  Filled 2022-03-10: qty 2

## 2022-03-10 MED ORDER — CEFAZOLIN SODIUM-DEXTROSE 2-4 GM/100ML-% IV SOLN
2.0000 g | INTRAVENOUS | Status: AC
Start: 2022-03-10 — End: 2022-03-10
  Administered 2022-03-10: 2 g via INTRAVENOUS

## 2022-03-10 MED ORDER — PROPOFOL 10 MG/ML IV BOLUS
INTRAVENOUS | Status: DC | PRN
  Administered 2022-03-10: 15 mg via INTRAVENOUS
  Administered 2022-03-10: 25 mg via INTRAVENOUS

## 2022-03-10 MED ORDER — LORATADINE 10 MG PO TABS
10.0000 mg | ORAL_TABLET | Freq: Every day | ORAL | Status: DC
  Administered 2022-03-10 – 2022-03-11 (×2): 10 mg via ORAL
  Filled 2022-03-10 (×2): qty 1

## 2022-03-10 MED ORDER — LACTATED RINGERS IV SOLN: INTRAVENOUS | Status: DC

## 2022-03-10 MED ORDER — MENTHOL 3 MG MT LOZG: 1.0000 | LOZENGE | OROMUCOSAL | Status: DC | PRN

## 2022-03-10 MED ORDER — ACETAMINOPHEN 500 MG PO TABS
ORAL_TABLET | ORAL | Status: AC
Start: 2022-03-10 — End: 2022-03-10
  Administered 2022-03-10: 1000 mg via ORAL
  Filled 2022-03-10: qty 2

## 2022-03-10 MED ORDER — DOCUSATE SODIUM 100 MG PO CAPS
100.0000 mg | ORAL_CAPSULE | Freq: Two times a day (BID) | ORAL | Status: DC
  Administered 2022-03-10 – 2022-03-12 (×4): 100 mg via ORAL
  Filled 2022-03-10 (×4): qty 1

## 2022-03-10 MED ORDER — HYDROMORPHONE HCL 1 MG/ML IJ SOLN
0.5000 mg | INTRAMUSCULAR | Status: DC | PRN
  Administered 2022-03-10 – 2022-03-11 (×5): 1 mg via INTRAVENOUS
  Filled 2022-03-10 (×6): qty 1

## 2022-03-10 MED ORDER — SODIUM CHLORIDE 0.9 % IV SOLN: INTRAVENOUS | Status: DC

## 2022-03-10 MED ORDER — OXYCODONE HCL 5 MG PO TABS
10.0000 mg | ORAL_TABLET | ORAL | Status: DC | PRN
  Administered 2022-03-11: 10 mg via ORAL
  Administered 2022-03-11: 15 mg via ORAL
  Administered 2022-03-11: 10 mg via ORAL
  Administered 2022-03-12 (×4): 15 mg via ORAL
  Filled 2022-03-10 (×6): qty 3

## 2022-03-10 MED ORDER — CHLORHEXIDINE GLUCONATE 0.12 % MT SOLN
OROMUCOSAL | Status: AC
  Administered 2022-03-10: 15 mL via OROMUCOSAL
  Filled 2022-03-10: qty 15

## 2022-03-10 MED ORDER — MIDAZOLAM HCL 2 MG/2ML IJ SOLN
INTRAMUSCULAR | Status: DC | PRN
  Administered 2022-03-10: 2 mg via INTRAVENOUS

## 2022-03-10 MED ORDER — DIPHENHYDRAMINE HCL 12.5 MG/5ML PO ELIX: 12.5000 mg | ORAL_SOLUTION | ORAL | Status: DC | PRN

## 2022-03-10 MED ORDER — OXYCODONE HCL 5 MG PO TABS: 5.0000 mg | ORAL_TABLET | Freq: Once | ORAL | Status: DC | PRN

## 2022-03-10 MED ORDER — ONDANSETRON HCL 4 MG/2ML IJ SOLN
4.0000 mg | Freq: Four times a day (QID) | INTRAMUSCULAR | Status: DC | PRN
  Administered 2022-03-10 – 2022-03-12 (×4): 4 mg via INTRAVENOUS
  Filled 2022-03-10 (×4): qty 2

## 2022-03-10 MED ORDER — METOCLOPRAMIDE HCL 5 MG PO TABS
5.0000 mg | ORAL_TABLET | Freq: Three times a day (TID) | ORAL | Status: DC | PRN
  Filled 2022-03-10: qty 2

## 2022-03-10 MED ORDER — TRANEXAMIC ACID-NACL 1000-0.7 MG/100ML-% IV SOLN
INTRAVENOUS | Status: AC
  Filled 2022-03-10: qty 100

## 2022-03-10 MED ORDER — ORAL CARE MOUTH RINSE: 15.0000 mL | Freq: Once | OROMUCOSAL | Status: AC

## 2022-03-10 MED ORDER — ACETAMINOPHEN 500 MG PO TABS: 1000.0000 mg | ORAL_TABLET | Freq: Once | ORAL | Status: AC

## 2022-03-10 MED ORDER — PHENYLEPHRINE HCL-NACL 20-0.9 MG/250ML-% IV SOLN
INTRAVENOUS | Status: DC | PRN
  Administered 2022-03-10: 30 ug/min via INTRAVENOUS

## 2022-03-10 MED ORDER — PHENYLEPHRINE 40 MCG/ML (10ML) SYRINGE FOR IV PUSH (FOR BLOOD PRESSURE SUPPORT)
PREFILLED_SYRINGE | INTRAVENOUS | Status: DC | PRN
  Administered 2022-03-10 (×2): 80 ug via INTRAVENOUS
  Administered 2022-03-10: 120 ug via INTRAVENOUS
  Administered 2022-03-10 (×4): 80 ug via INTRAVENOUS

## 2022-03-10 MED ORDER — METOCLOPRAMIDE HCL 5 MG/ML IJ SOLN
5.0000 mg | Freq: Three times a day (TID) | INTRAMUSCULAR | Status: DC | PRN
  Administered 2022-03-10 – 2022-03-11 (×2): 10 mg via INTRAVENOUS
  Filled 2022-03-10 (×2): qty 2

## 2022-03-10 MED ORDER — CEFAZOLIN SODIUM-DEXTROSE 2-4 GM/100ML-% IV SOLN
INTRAVENOUS | Status: AC
  Filled 2022-03-10: qty 100

## 2022-03-10 MED ORDER — ALBUMIN HUMAN 5 % IV SOLN
12.5000 g | Freq: Once | INTRAVENOUS | Status: AC
Start: 2022-03-10 — End: 2022-03-10
  Administered 2022-03-10: 12.5 g via INTRAVENOUS

## 2022-03-10 MED ORDER — METHOCARBAMOL 1000 MG/10ML IJ SOLN
500.0000 mg | Freq: Four times a day (QID) | INTRAVENOUS | Status: DC | PRN
  Filled 2022-03-10: qty 5

## 2022-03-10 MED ORDER — FENTANYL CITRATE (PF) 100 MCG/2ML IJ SOLN
INTRAMUSCULAR | Status: AC
  Administered 2022-03-10: 50 ug via INTRAVENOUS
  Filled 2022-03-10: qty 2

## 2022-03-10 MED ORDER — OXYCODONE HCL 5 MG/5ML PO SOLN: 5.0000 mg | Freq: Once | ORAL | Status: DC | PRN

## 2022-03-10 MED ORDER — FENTANYL CITRATE (PF) 100 MCG/2ML IJ SOLN
25.0000 ug | INTRAMUSCULAR | Status: DC | PRN
  Administered 2022-03-10 (×5): 25 ug via INTRAVENOUS

## 2022-03-10 MED ORDER — PANTOPRAZOLE SODIUM 40 MG PO TBEC
40.0000 mg | DELAYED_RELEASE_TABLET | Freq: Every day | ORAL | Status: DC
  Administered 2022-03-10 – 2022-03-12 (×3): 40 mg via ORAL
  Filled 2022-03-10 (×3): qty 1

## 2022-03-10 MED ORDER — ONDANSETRON HCL 4 MG/2ML IJ SOLN
INTRAMUSCULAR | Status: AC
  Filled 2022-03-10: qty 2

## 2022-03-10 MED ORDER — ALUM & MAG HYDROXIDE-SIMETH 200-200-20 MG/5ML PO SUSP: 30.0000 mL | ORAL | Status: DC | PRN

## 2022-03-10 MED ORDER — AMISULPRIDE (ANTIEMETIC) 5 MG/2ML IV SOLN: 10.0000 mg | Freq: Once | INTRAVENOUS | Status: DC | PRN

## 2022-03-10 MED ORDER — ONDANSETRON HCL 4 MG PO TABS: 4.0000 mg | ORAL_TABLET | Freq: Four times a day (QID) | ORAL | Status: DC | PRN

## 2022-03-10 MED ORDER — PROPOFOL 500 MG/50ML IV EMUL
INTRAVENOUS | Status: DC | PRN
  Administered 2022-03-10: 50 ug/kg/min via INTRAVENOUS

## 2022-03-10 MED ORDER — POVIDONE-IODINE 10 % EX SWAB
2.0000 "application " | Freq: Once | CUTANEOUS | Status: AC
  Administered 2022-03-10: 2 via TOPICAL

## 2022-03-10 MED ORDER — ONDANSETRON HCL 4 MG/2ML IJ SOLN: 4.0000 mg | Freq: Once | INTRAMUSCULAR | Status: DC | PRN

## 2022-03-10 MED ORDER — CEFAZOLIN SODIUM-DEXTROSE 1-4 GM/50ML-% IV SOLN
1.0000 g | Freq: Four times a day (QID) | INTRAVENOUS | Status: AC
  Administered 2022-03-10 – 2022-03-11 (×2): 1 g via INTRAVENOUS
  Filled 2022-03-10 (×2): qty 50

## 2022-03-10 MED ORDER — SODIUM CHLORIDE 0.9 % IR SOLN
Status: DC | PRN
  Administered 2022-03-10: 1000 mL

## 2022-03-10 MED ORDER — CHLORHEXIDINE GLUCONATE 0.12 % MT SOLN: 15.0000 mL | Freq: Once | OROMUCOSAL | Status: AC

## 2022-03-10 MED ORDER — AZELASTINE-FLUTICASONE 137-50 MCG/ACT NA SUSP: 1.0000 | Freq: Two times a day (BID) | NASAL | Status: DC

## 2022-03-10 MED ORDER — LEVOCETIRIZINE DIHYDROCHLORIDE 5 MG PO TABS: 5.0000 mg | ORAL_TABLET | Freq: Every day | ORAL | Status: DC

## 2022-03-10 MED ORDER — MIDAZOLAM HCL 2 MG/2ML IJ SOLN
INTRAMUSCULAR | Status: AC
  Administered 2022-03-10: 2 mg via INTRAVENOUS
  Filled 2022-03-10: qty 2

## 2022-03-10 MED ORDER — HYDROMORPHONE HCL 1 MG/ML IJ SOLN
INTRAMUSCULAR | Status: AC
  Filled 2022-03-10: qty 1

## 2022-03-10 MED ORDER — TRANEXAMIC ACID-NACL 1000-0.7 MG/100ML-% IV SOLN
1000.0000 mg | INTRAVENOUS | Status: AC
  Administered 2022-03-10: 1000 mg via INTRAVENOUS

## 2022-03-10 MED ORDER — MIDAZOLAM HCL 2 MG/2ML IJ SOLN: 2.0000 mg | Freq: Once | INTRAMUSCULAR | Status: AC

## 2022-03-10 MED ORDER — ONDANSETRON HCL 4 MG/2ML IJ SOLN
INTRAMUSCULAR | Status: DC | PRN
  Administered 2022-03-10: 4 mg via INTRAVENOUS

## 2022-03-10 MED ORDER — BUPIVACAINE HCL (PF) 0.5 % IJ SOLN
INTRAMUSCULAR | Status: DC | PRN
  Administered 2022-03-10: 30 mL

## 2022-03-10 MED ORDER — 0.9 % SODIUM CHLORIDE (POUR BTL) OPTIME
TOPICAL | Status: DC | PRN
  Administered 2022-03-10: 1000 mL

## 2022-03-10 MED ORDER — FENTANYL CITRATE (PF) 250 MCG/5ML IJ SOLN
INTRAMUSCULAR | Status: AC
  Filled 2022-03-10: qty 5

## 2022-03-10 MED ORDER — PHENOL 1.4 % MT LIQD: 1.0000 | OROMUCOSAL | Status: DC | PRN

## 2022-03-10 MED ORDER — ACETAMINOPHEN 325 MG PO TABS
325.0000 mg | ORAL_TABLET | Freq: Four times a day (QID) | ORAL | Status: DC | PRN
  Administered 2022-03-11: 650 mg via ORAL
  Filled 2022-03-10: qty 2

## 2022-03-10 MED ORDER — OXYCODONE HCL 5 MG PO TABS
5.0000 mg | ORAL_TABLET | ORAL | Status: DC | PRN
  Administered 2022-03-10: 5 mg via ORAL
  Filled 2022-03-10 (×4): qty 2

## 2022-03-10 MED ORDER — MIDAZOLAM HCL 2 MG/2ML IJ SOLN
INTRAMUSCULAR | Status: AC
  Filled 2022-03-10: qty 2

## 2022-03-10 MED ORDER — ASPIRIN 81 MG PO CHEW
81.0000 mg | CHEWABLE_TABLET | Freq: Two times a day (BID) | ORAL | Status: DC
  Administered 2022-03-10 – 2022-03-12 (×4): 81 mg via ORAL
  Filled 2022-03-10 (×4): qty 1

## 2022-03-10 MED ORDER — LIDOCAINE 2% (20 MG/ML) 5 ML SYRINGE
INTRAMUSCULAR | Status: AC
  Filled 2022-03-10: qty 5

## 2022-03-10 MED ORDER — BUPIVACAINE IN DEXTROSE 0.75-8.25 % IT SOLN
INTRATHECAL | Status: DC | PRN
  Administered 2022-03-10: 1.6 mL via INTRATHECAL

## 2022-03-10 MED ORDER — AZELASTINE HCL 0.1 % NA SOLN
1.0000 | Freq: Two times a day (BID) | NASAL | Status: DC
  Administered 2022-03-11 – 2022-03-12 (×3): 1 via NASAL
  Filled 2022-03-10: qty 30

## 2022-03-10 MED ORDER — METHOCARBAMOL 500 MG PO TABS
500.0000 mg | ORAL_TABLET | Freq: Four times a day (QID) | ORAL | Status: DC | PRN
  Administered 2022-03-10 – 2022-03-12 (×4): 500 mg via ORAL
  Filled 2022-03-10 (×4): qty 1

## 2022-03-10 MED ORDER — ALBUMIN HUMAN 5 % IV SOLN
INTRAVENOUS | Status: AC
  Filled 2022-03-10: qty 250

## 2022-03-10 MED ORDER — FENTANYL CITRATE (PF) 100 MCG/2ML IJ SOLN: 50.0000 ug | Freq: Once | INTRAMUSCULAR | Status: AC

## 2022-03-10 MED ORDER — FLUTICASONE PROPIONATE 50 MCG/ACT NA SUSP
1.0000 | Freq: Two times a day (BID) | NASAL | Status: DC
  Administered 2022-03-11 – 2022-03-12 (×2): 1 via NASAL
  Filled 2022-03-10: qty 16

## 2022-03-10 SURGICAL SUPPLY — 76 items
BAG COUNTER SPONGE SURGICOUNT (BAG) ×2 IMPLANT
BAG SPNG CNTER NS LX DISP (BAG) ×1
BANDAGE ESMARK 6X9 LF (GAUZE/BANDAGES/DRESSINGS) ×1 IMPLANT
BLADE SAG 18X100X1.27 (BLADE) ×2 IMPLANT
BNDG CMPR 9X6 STRL LF SNTH (GAUZE/BANDAGES/DRESSINGS) ×1
BNDG CMPR MED 10X6 ELC LF (GAUZE/BANDAGES/DRESSINGS) ×1
BNDG ELASTIC 6X10 VLCR STRL LF (GAUZE/BANDAGES/DRESSINGS) ×1 IMPLANT
BNDG ELASTIC 6X5.8 VLCR STR LF (GAUZE/BANDAGES/DRESSINGS) ×4 IMPLANT
BNDG ESMARK 6X9 LF (GAUZE/BANDAGES/DRESSINGS) ×2
BOWL SMART MIX CTS (DISPOSABLE) ×2 IMPLANT
BSPLAT TIB 5D D CMNT STM RT (Knees) ×1 IMPLANT
CEMENT BONE R 1X40 (Cement) ×2 IMPLANT
COMP FEM CEMT PERS NARROW 6 RT (Joint) ×2 IMPLANT
COMPONENT FEM CMT PERS NRW 6RT (Joint) IMPLANT
COVER SURGICAL LIGHT HANDLE (MISCELLANEOUS) ×2 IMPLANT
CUFF TOURN SGL QUICK 34 (TOURNIQUET CUFF) ×2
CUFF TOURN SGL QUICK 42 (TOURNIQUET CUFF) IMPLANT
CUFF TRNQT CYL 34X4.125X (TOURNIQUET CUFF) ×1 IMPLANT
DRAPE EXTREMITY T 121X128X90 (DISPOSABLE) ×2 IMPLANT
DRAPE HALF SHEET 40X57 (DRAPES) ×2 IMPLANT
DRAPE U-SHAPE 47X51 STRL (DRAPES) ×2 IMPLANT
DRSG PAD ABDOMINAL 8X10 ST (GAUZE/BANDAGES/DRESSINGS) ×2 IMPLANT
DURAPREP 26ML APPLICATOR (WOUND CARE) ×2 IMPLANT
ELECT CAUTERY BLADE 6.4 (BLADE) ×2 IMPLANT
ELECT REM PT RETURN 9FT ADLT (ELECTROSURGICAL) ×2
ELECTRODE REM PT RTRN 9FT ADLT (ELECTROSURGICAL) ×1 IMPLANT
FACESHIELD WRAPAROUND (MASK) ×4 IMPLANT
FACESHIELD WRAPAROUND OR TEAM (MASK) ×2 IMPLANT
GAUZE SPONGE 4X4 12PLY STRL (GAUZE/BANDAGES/DRESSINGS) ×2 IMPLANT
GAUZE XEROFORM 1X8 LF (GAUZE/BANDAGES/DRESSINGS) ×2 IMPLANT
GLOVE SRG 8 PF TXTR STRL LF DI (GLOVE) ×2 IMPLANT
GLOVE SURG ORTHO LTX SZ7.5 (GLOVE) ×2 IMPLANT
GLOVE SURG ORTHO LTX SZ8 (GLOVE) ×2 IMPLANT
GLOVE SURG UNDER POLY LF SZ8 (GLOVE) ×4
GOWN STRL REUS W/ TWL LRG LVL3 (GOWN DISPOSABLE) IMPLANT
GOWN STRL REUS W/ TWL XL LVL3 (GOWN DISPOSABLE) ×2 IMPLANT
GOWN STRL REUS W/TWL LRG LVL3 (GOWN DISPOSABLE)
GOWN STRL REUS W/TWL XL LVL3 (GOWN DISPOSABLE) ×4
HANDPIECE INTERPULSE COAX TIP (DISPOSABLE) ×2
HDLS TROCR DRIL PIN KNEE 75 (PIN) ×8
IMMOBILIZER KNEE 22 UNIV (SOFTGOODS) ×2 IMPLANT
KIT BASIN OR (CUSTOM PROCEDURE TRAY) ×2 IMPLANT
KIT TURNOVER KIT B (KITS) ×2 IMPLANT
LINER ASF PERS 10X6/7 CD RT (Liner) ×1 IMPLANT
MANIFOLD NEPTUNE II (INSTRUMENTS) ×2 IMPLANT
NDL 18GX1X1/2 (RX/OR ONLY) (NEEDLE) IMPLANT
NEEDLE 18GX1X1/2 (RX/OR ONLY) (NEEDLE) IMPLANT
NS IRRIG 1000ML POUR BTL (IV SOLUTION) ×2 IMPLANT
PACK TOTAL JOINT (CUSTOM PROCEDURE TRAY) ×2 IMPLANT
PAD ARMBOARD 7.5X6 YLW CONV (MISCELLANEOUS) ×2 IMPLANT
PAD CAST 4YDX4 CTTN HI CHSV (CAST SUPPLIES) IMPLANT
PADDING CAST COTTON 4X4 STRL (CAST SUPPLIES) ×2
PADDING CAST COTTON 6X4 STRL (CAST SUPPLIES) ×2 IMPLANT
PIN DRILL HDLS TROCAR 75 4PK (PIN) IMPLANT
SCREW FEMALE HEX FIX 25X2.5 (ORTHOPEDIC DISPOSABLE SUPPLIES) ×1 IMPLANT
SET HNDPC FAN SPRY TIP SCT (DISPOSABLE) ×1 IMPLANT
SET PAD KNEE POSITIONER (MISCELLANEOUS) ×2 IMPLANT
STAPLER VISISTAT 35W (STAPLE) IMPLANT
STEM POLY PAT PLY 29M KNEE (Knees) ×1 IMPLANT
STEM TIBIA 5 DEG SZ D R KNEE (Knees) IMPLANT
STRIP CLOSURE SKIN 1/2X4 (GAUZE/BANDAGES/DRESSINGS) IMPLANT
SUCTION FRAZIER HANDLE 10FR (MISCELLANEOUS) ×2
SUCTION TUBE FRAZIER 10FR DISP (MISCELLANEOUS) ×1 IMPLANT
SUT MNCRL AB 4-0 PS2 18 (SUTURE) IMPLANT
SUT VIC AB 0 CT1 27 (SUTURE) ×2
SUT VIC AB 0 CT1 27XBRD ANBCTR (SUTURE) ×1 IMPLANT
SUT VIC AB 1 CT1 27 (SUTURE) ×4
SUT VIC AB 1 CT1 27XBRD ANBCTR (SUTURE) ×2 IMPLANT
SUT VIC AB 2-0 CT1 27 (SUTURE) ×4
SUT VIC AB 2-0 CT1 TAPERPNT 27 (SUTURE) ×2 IMPLANT
SYR 50ML LL SCALE MARK (SYRINGE) IMPLANT
TIBIA STEM 5 DEG SZ D R KNEE (Knees) ×2 IMPLANT
TOWEL GREEN STERILE (TOWEL DISPOSABLE) ×2 IMPLANT
TOWEL GREEN STERILE FF (TOWEL DISPOSABLE) ×2 IMPLANT
TRAY CATH 16FR W/PLASTIC CATH (SET/KITS/TRAYS/PACK) IMPLANT
WRAP KNEE MAXI GEL POST OP (GAUZE/BANDAGES/DRESSINGS) ×2 IMPLANT

## 2022-03-10 NOTE — Anesthesia Procedure Notes (Signed)
Spinal ? ?Patient location during procedure: OR ?Start time: 03/10/2022 2:25 PM ?End time: 03/10/2022 2:30 PM ?Reason for block: surgical anesthesia ?Staffing ?Performed: anesthesiologist  ?Anesthesiologist: Gaynelle Adu, MD ?Preanesthetic Checklist ?Completed: patient identified, IV checked, risks and benefits discussed, surgical consent, monitors and equipment checked, pre-op evaluation and timeout performed ?Spinal Block ?Patient position: sitting ?Prep: DuraPrep ?Patient monitoring: cardiac monitor, continuous pulse ox and blood pressure ?Approach: midline ?Location: L3-4 ?Injection technique: single-shot ?Needle ?Needle type: Pencan  ?Needle gauge: 24 G ?Needle length: 9 cm ?Assessment ?Sensory level: T8 ?Events: CSF return ?Additional Notes ?Functioning IV was confirmed and monitors were applied. Sterile prep and drape, including hand hygiene and sterile gloves were used. The patient was positioned and the spine was prepped. The skin was anesthetized with lidocaine.  Free flow of clear CSF was obtained prior to injecting local anesthetic into the CSF.  The spinal needle aspirated freely following injection.  The needle was carefully withdrawn.  The patient tolerated the procedure well.  ? ? ? ?

## 2022-03-10 NOTE — Anesthesia Procedure Notes (Signed)
Anesthesia Regional Block: Adductor canal block  ? ?Pre-Anesthetic Checklist: , timeout performed,  Correct Patient, Correct Site, Correct Laterality,  Correct Procedure, Correct Position, site marked,  Risks and benefits discussed,  Surgical consent,  Pre-op evaluation,  At surgeon's request and post-op pain management ? ?Laterality: Right ? ?Prep: chloraprep     ?  ?Needles:  ?Injection technique: Single-shot ? ?Needle Type: Echogenic Stimulator Needle   ? ? ?Needle Length: 10cm  ?Needle Gauge: 20  ? ? ? ?Additional Needles: ? ? ?Procedures:,,,, ultrasound used (permanent image in chart),,    ?Narrative:  ?Start time: 03/10/2022 12:30 PM ?End time: 03/10/2022 12:35 PM ?Injection made incrementally with aspirations every 5 mL. ? ?Performed by: Personally  ?Anesthesiologist: Mellody Dance, MD ? ?Additional Notes: ?Functioning IV was confirmed and monitors were applied.  Sterile prep and drape,hand hygiene and sterile gloves were used. Ultrasound guidance: relevant anatomy identified, needle position confirmed, local anesthetic spread visualized around nerve(s)., vascular puncture avoided. Negative aspiration and negative test dose prior to incremental administration of local anesthetic. The patient tolerated the procedure well. ? ? ? ? ? ? ?

## 2022-03-10 NOTE — Op Note (Signed)
NAME: Laura Wheeler, Laura Wheeler. ?MEDICAL RECORD NO: [id number] ?ACCOUNT NO: [id number] ?DATE OF BIRTH: 1961-06-09 ?FACILITY: [facility] ?LOCATION: [facility] ?PHYSICIAN: Lind Guest. Ninfa Linden, MD ? ?Operative Report  ? ?DATE OF PROCEDURE: 03/10/2022 ? ?PREOPERATIVE DIAGNOSIS:  Primary osteoarthritis and degenerative joint disease, right knee. ? ?POSTOPERATIVE DIAGNOSIS:  Primary osteoarthritis and degenerative joint disease, right knee. ? ?PROCEDURE:  Right total knee arthroplasty. ? ?IMPLANTS:  Biomet/Zimmer cemented Persona knee system with size 6 narrow right femur, size Wheeler right tibial tray, 10 mm medial congruent fixed bearing polyethylene insert, size 29 patellar button. ? ?SURGEON:  Lind Guest. Ninfa Linden, MD ? ?ASSISTANT:  Benita Stabile, PA-C. ? ?ANESTHESIA:   ?1.  Right lower extremity adductor canal block. ?2.  Spinal. ? ?ANTIBIOTICS:  2 g IV Ancef. ? ?ESTIMATED BLOOD LOSS:  Less than 100 mL. ? ?TOURNIQUET TIME:  Less than 1 hour. ? ?COMPLICATIONS:  None. ? ?INDICATIONS:  The patient is a very pleasant and active 61 year old female with debilitating arthritis involving her right knee that has been well documented.  She has had right knee pain for over 10 years.  She has tried and failed all forms of  ?conservative treatment including even multiple injections and PRP.  At this point, her right knee pain is debilitating and is detrimentally affecting her mobility, her quality of life and activities of daily living to the point that it has been  ?recommended she undergo total knee arthroplasty.  She does wish to proceed with this as well.  We did talk in length in detail the risk of acute blood loss anemia, nerve or vessel injury, fracture, infection, DVT, implant failure and skin and soft tissue ? issues.  She understands our goals are hopefully decrease pain, improve mobility and overall improve quality of life. ? ?DESCRIPTION OF PROCEDURE:  After informed consent was obtained, appropriate right knee was marked.  Anesthesia  obtained a right lower extremity adductor canal block in the holding room.  She was then brought to the operating room and sat up on the  ?operating table where spinal anesthesia was obtained.  She was then laid in supine position on the operating table, Foley catheter was placed and a nonsterile tourniquet was placed around her upper right thigh.  Her right thigh, knee, leg, ankle and foot ? were then prepped and draped with DuraPrep and sterile drapes.  A timeout was called and she was identified correct patient, correct right knee.  We then used Esmarch to wrap that leg and tourniquet was inflated to 300 mm of pressure.  I made a direct  ?midline incision over the patella and carried this proximally and distally.  We dissected down the knee joint and carried out a medial parapatellar arthrotomy, finding a moderate joint effusion.  With the knee in a flexed position, we removed remnants of ? the ACL, medial and lateral meniscus and osteophytes from all 3 compartments.  There was significant cartilage wear in all three compartments.  We then used the extramedullary cutting guide for making our proximal tibia cut, setting this for a 3-degree  ?slope, correcting for varus and valgus and taking 9 mm off the high side.  We made this cut without difficulty.  We turned to the femur and used an intramedullary guide for making our distal femoral cut, setting this for a right knee at 5 degrees  ?externally rotated for a 10 mm distal femoral cut.  We brought the knee back down to full extension and made sure we had remnants of the meniscus  removed from the back of the knee.  The popliteus was intact.  We then placed a 10 mm extension block and  ?achieved full extension.  We then went back to the femur and put our femoral sizing guide based off the epicondylar axis and Whiteside's line.  Based off of this, we chose a size 6 femur.  We put a 4-in-1 cutting block for a size 6 femur and we made our  ?anterior, posterior cuts  followed by our chamfer cuts.  We then went back to the tibia and chose a size Wheeler right tibial tray for coverage setting the rotation off the tibial tubercle and the femur.  We made our keel punch and drill hole off of this.  We  ?then trialed our size Wheeler right tibia, followed by our size 6 right femur.  We placed a 10 mm fixed bearing polyethylene trial insert. We were pleased with range of motion, stability without insert.  We then made our patellar cut and drilled three holes  ?for size 29 patellar button.  With all trial instrumentation, we put her knee through several cycles of motion and again we were pleased with range of motion and stability of the knee.  We then removed all trial instrumentation from the knee and  ?irrigated the knee with normal saline solution using pulsatile lavage.  We dried the knee real well and then mixed our cement.  With the knee in a flexed position, we cemented our Biomet Zimmer Persona tibial tray for a right knee, size Wheeler, followed by  ?cementing our size right 6 narrow femur.  We removed excess cement debris from the knee and placed our 10 mm fixed bearing medial congruent polyethylene insert and cemented our patellar button.  I then held the knee fully extended and compressed for the  ?cement to harden.  Once it hardened, we put her through several cycles of motion again and we were pleased with range of motion and stability.  We then let the tourniquet down.  Hemostasis was obtained with electrocautery, we closed the arthrotomy with  ?interrupted #1 Vicryl suture followed by 0 Vicryl to close deep tissue and 2-0 Vicryl to close the subcutaneous tissue.  The skin was reapproximated with staples.  A well-padded sterile dressing was applied.  She was taken to recovery room in stable  ?condition with all final counts being correct.  No complications noted.   ? ?Of note, Erskine Emery, PA-C, did assist in entire case and assistance was crucial for facilitating every aspect of this  case including assistance with soft tissue management and retraction, assisting with proper positioning of implants and layered  ?closure of the wound.  His assistance was medically necessary. ? ? ? ? ?PAA ?Wheeler: 03/10/2022 3:58:49 pm T: 03/10/2022 10:14:00 pm  ?JOB: [id number]  ?

## 2022-03-10 NOTE — Progress Notes (Signed)
Patient arrived to unit via stretcher from PACU.  A/O requesting pain medication on arrival.  Patient transferred by staff sliding from stretcher.  Immobilizer and iceman in place.  Patient able to move foot and has 2+ pulses.  ?

## 2022-03-10 NOTE — Anesthesia Procedure Notes (Signed)
Procedure Name: [place] ?Date/Time: 03/10/2022 2:25 PM ?Performed by: Janace Litten, CRNA ?Pre-anesthesia Checklist: Patient identified, Emergency Drugs available, Suction available and Patient being monitored ?Patient Re-evaluated:Patient Re-evaluated prior to induction ?Oxygen Delivery Method: Simple face mask ? ? ? ? ?

## 2022-03-10 NOTE — H&P (Signed)
TOTAL KNEE ADMISSION H&P ? ?Patient is being admitted for right total knee arthroplasty. ? ?Subjective: ? ?Chief Complaint:right knee pain. ? ?HPI: Laura Wheeler, 61 y.o. female, has a history of pain and functional disability in the right knee due to arthritis and has failed non-surgical conservative treatments for greater than 12 weeks to includeNSAID's and/or analgesics, corticosteriod injections, and activity modification.  Onset of symptoms was gradual, starting 3 years ago with gradually worsening course since that time. The patient noted no past surgery on the right knee(s).  Patient currently rates pain in the right knee(s) at 10 out of 10 with activity. Patient has night pain, worsening of pain with activity and weight bearing, pain that interferes with activities of daily living, pain with passive range of motion, crepitus, and joint swelling.  Patient has evidence of subchondral sclerosis, periarticular osteophytes, and joint space narrowing by imaging studies. There is no active infection. ? ?Patient Active Problem List  ? Diagnosis Date Noted  ? Unilateral primary osteoarthritis, right knee 03/10/2022  ? Primary osteoarthritis of left knee 10/20/2019  ? Nephrolithiasis 01/15/2016  ? Palpitations 01/15/2015  ? Plantar plate injury [id number]  ? Neuropathic pain of left foot 09/12/2013  ? WOLFF (WOLFE)-PARKINSON-WHITE (WPW) SYNDROME 09/30/2009  ? Allergic rhinitis 09/30/2009  ? ?Past Medical History:  ?Diagnosis Date  ? Arthritis   ? "knees" (05/19/2013)  ? Bronchitis   ? History of cardiac radiofrequency ablation 04/20/2013  ? Hx of echocardiogram   ? Echo (12/15):  EF 60-65%, no RWMA, Gr 1 DD  ? SVT (supraventricular tachycardia) ([place])   ? WPW (Wolff-Parkinson-White syndrome) ~ 1983  ? a. s/p ablation 05/20/13.  ?  ?Past Surgical History:  ?Procedure Laterality Date  ? ANAL FISSURE REPAIR  1980's  ? BREAST BIOPSY Left 2000's  ? EXCISION MORTON'S NEUROMA Left 05/04/2013  ? Procedure: LEFT SECOND MT   WEIL/SECOND WEBB SPACE NEUROMA EXCISION ;  Surgeon: Wylene Simmer, MD;  Location: [facility];  Service: Orthopedics;  Laterality: Left;  ? FOOT SURGERY Left 09/2014  ? hardware removal  ? SUPRAVENTRICULAR TACHYCARDIA ABLATION  05/19/2013  ? SUPRAVENTRICULAR TACHYCARDIA ABLATION N/A 05/19/2013  ? Procedure: SUPRAVENTRICULAR TACHYCARDIA ABLATION;  Surgeon: Evans Lance, MD;  Location: [facility] CATH LAB;  Service: Cardiovascular;  Laterality: N/A;  ?  ?Current Facility-Administered Medications  ?Medication Dose Route Frequency Provider Last Rate Last Admin  ? ceFAZolin (ANCEF) 2-4 GM/100ML-% IVPB           ? ceFAZolin (ANCEF) IVPB 2g/100 mL premix  2 g Intravenous On Call to OR Pete Pelt, PA-C      ? fentaNYL (SUBLIMAZE) 100 MCG/2ML injection           ? lactated ringers infusion   Intravenous Continuous Annye Asa, MD 10 mL/hr at 03/10/22 1210 New Bag at 03/10/22 1210  ? midazolam (VERSED) 2 MG/2ML injection           ? tranexamic acid (CYKLOKAPRON) 1000MG /142mL IVPB           ? tranexamic acid (CYKLOKAPRON) IVPB 1,000 mg  1,000 mg Intravenous To OR Pete Pelt, PA-C      ? ?Allergies  ?Allergen Reactions  ? Dog Epithelium Allergy Skin Test Itching and Shortness Of Breath  ?  Other reaction(s): Cough, Respiratory Distress  ? Horse Epithelium Shortness Of Breath  ?  Other reaction(s): Cough, Respiratory Distress  ? Sulfa Antibiotics Itching and Other (See Comments)  ?  Doesn't remember reaction - 20 years ago  ?  ?  Social History  ? ?Tobacco Use  ? Smoking status: Never  ? Smokeless tobacco: Never  ?Substance Use Topics  ? Alcohol use: Not Currently  ?  Comment: 1-2 glasses occassionaly  ?  ?Family History  ?Problem Relation Age of Onset  ? Diabetes Mother   ? Depression Mother   ? Melanoma Mother   ? Hypothyroidism Mother   ? Hyperlipidemia Mother   ? Cancer Father   ?     ?osteosarcoma leg  ? Hypertension Father   ? Arthritis Brother   ? Heart attack Neg Hx   ?  ? ?Review of Systems  ?Musculoskeletal:  Positive  for gait problem and joint swelling.  ?All other systems reviewed and are negative. ? ?Objective: ? ?Physical Exam ?Vitals reviewed.  ?Constitutional:   ?   Appearance: Normal appearance.  ?HENT:  ?   Head: Normocephalic and atraumatic.  ?Eyes:  ?   Extraocular Movements: Extraocular movements intact.  ?   Pupils: Pupils are equal, round, and reactive to light.  ?Cardiovascular:  ?   Rate and Rhythm: Normal rate.  ?Pulmonary:  ?   Effort: Pulmonary effort is normal.  ?   Breath sounds: Normal breath sounds.  ?Abdominal:  ?   Palpations: Abdomen is soft.  ?Musculoskeletal:  ?   Cervical back: Normal range of motion.  ?   Right knee: Effusion, bony tenderness and crepitus present. Decreased range of motion. Tenderness present over the medial joint line, lateral joint line and patellar tendon.  ?Neurological:  ?   Mental Status: She is alert and oriented to person, place, and time.  ?Psychiatric:     ?   Behavior: Behavior normal.  ? ? ?Vital signs in last 24 hours: ?Temp:  [98.1 ?F (36.7 ?C)] 98.1 ?F (36.7 ?C) (03/21 1154) ?Pulse Rate:  [121] 121 (03/21 1154) ?Resp:  [18] 18 (03/21 1154) ?BP: (143)/(78) 143/78 (03/21 1154) ?SpO2:  [100 %] 100 % (03/21 1154) ?Weight:  [74.8 kg] 74.8 kg (03/21 1154) ? ?Labs: ? ? ?Estimated body mass index is 26.63 kg/m? as calculated from the following: ?  Height as of this encounter: 5\' 6"  (1.676 m). ?  Weight as of this encounter: 74.8 kg. ? ? ?Imaging Review ?Plain radiographs demonstrate severe degenerative joint disease of the right knee(s). The overall alignment isneutral. The bone quality appears to be good for age and reported activity level. ? ? ? ? ? ?Assessment/Plan: ? ?End stage arthritis, right knee  ? ?The patient history, physical examination, clinical judgment of the provider and imaging studies are consistent with end stage degenerative joint disease of the right knee(s) and total knee arthroplasty is deemed medically necessary. The treatment options including medical  management, injection therapy arthroscopy and arthroplasty were discussed at length. The risks and benefits of total knee arthroplasty were presented and reviewed. The risks due to aseptic loosening, infection, stiffness, patella tracking problems, thromboembolic complications and other imponderables were discussed. The patient acknowledged the explanation, agreed to proceed with the plan and consent was signed. Patient is being admitted for inpatient treatment for surgery, pain control, PT, OT, prophylactic antibiotics, VTE prophylaxis, progressive ambulation and ADL's and discharge planning. The patient is planning to be discharged home with home health services ? ? ? ? ?

## 2022-03-10 NOTE — Anesthesia Postprocedure Evaluation (Signed)
Anesthesia Post Note ? ?Patient: Laura Wheeler ? ?Procedure(s) Performed: Right TOTAL KNEE ARTHROPLASTY (Right: Knee) ? ?  ? ?Patient location during evaluation: PACU ?Anesthesia Type: Regional and Spinal ?Level of consciousness: oriented and awake and alert ?Pain management: pain level controlled ?Vital Signs Assessment: post-procedure vital signs reviewed and stable ?Respiratory status: spontaneous breathing and respiratory function stable ?Cardiovascular status: blood pressure returned to baseline and stable ?Postop Assessment: no headache, no backache, no apparent nausea or vomiting, spinal receding and patient able to bend at knees ?Anesthetic complications: no ? ? ?No notable events documented. ? ?Last Vitals:  ?Vitals:  ? 03/10/22 1710 03/10/22 1725  ?BP: (!) 88/63 (!) 93/55  ?Pulse: 82 78  ?Resp: 18 13  ?Temp:    ?SpO2: 100% 97%  ?  ?Last Pain:  ?Vitals:  ? 03/10/22 1655  ?TempSrc:   ?PainSc: 7   ? ? ?  ?  ?  ?  ?  ?  ? ?Adaia Matthies,W. EDMOND ? ? ? ? ?

## 2022-03-10 NOTE — Transfer of Care (Signed)
Immediate Anesthesia Transfer of Care Note ? ?Patient: Laura Wheeler ? ?Procedure(s) Performed: Right TOTAL KNEE ARTHROPLASTY (Right: Knee) ? ?Patient Location: PACU ? ?Anesthesia Type:General ? ?Level of Consciousness: awake, alert  and patient cooperative ? ?Airway & Oxygen Therapy: Patient Spontanous Breathing ? ?Post-op Assessment: Report given to RN and Post -op Vital signs reviewed and stable ? ?Post vital signs: Reviewed and stable ? ?Last Vitals:  ?Vitals Value Taken Time  ?BP 95/42 03/10/22 1623  ?Temp    ?Pulse 78 03/10/22 1624  ?Resp 18 03/10/22 1624  ?SpO2 98 % 03/10/22 1624  ?Vitals shown include unvalidated device data. ? ?Last Pain:  ?Vitals:  ? 03/10/22 1205  ?TempSrc:   ?PainSc: 2   ?   ? ?Patients Stated Pain Goal: 1 (03/10/22 1205) ? ?Complications: No notable events documented. ?

## 2022-03-10 NOTE — Brief Op Note (Signed)
03/10/2022 ? ?4:00 PM ? ?PATIENT:  Laura Wheeler  61 y.o. female ? ?PRE-OPERATIVE DIAGNOSIS:  Osteoarthritis / Degenerative Joint Diease Right Knee ? ?POST-OPERATIVE DIAGNOSIS:  Osteoarthritis / Degenerative Joint Diease Right Knee ? ?PROCEDURE:  Procedure(s): ?Right TOTAL KNEE ARTHROPLASTY (Right) ? ?SURGEON:  Surgeon(s) and Role: ?   Mcarthur Rossetti, MD - Primary ? ?PHYSICIAN ASSISTANT:  Benita Stabile, PA-C ? ?ANESTHESIA:   regional and spinal ? ?COUNTS:  YES ? ?TOURNIQUET:   ?Total Tourniquet Time Documented: ?Thigh (Right) - 48 minutes ?Total: Thigh (Right) - 48 minutes ? ? ?DICTATION: .Other Dictation: Dictation Number [id number]' ? ?PLAN OF CARE: Admit for overnight observation ? ?PATIENT DISPOSITION:  PACU - hemodynamically stable. ?  ?Delay start of Pharmacological VTE agent (>24hrs) due to surgical blood loss or risk of bleeding: no ? ?

## 2022-03-11 ENCOUNTER — Encounter (HOSPITAL_COMMUNITY): Payer: Self-pay | Admitting: Orthopaedic Surgery

## 2022-03-11 DIAGNOSIS — M1711 Unilateral primary osteoarthritis, right knee: Secondary | ICD-10-CM | POA: Diagnosis not present

## 2022-03-11 LAB — CBC
HCT: 35.5 % — ABNORMAL LOW (ref 36.0–46.0)
Hemoglobin: 12 g/dL (ref 12.0–15.0)
MCH: 31.5 pg (ref 26.0–34.0)
MCHC: 33.8 g/dL (ref 30.0–36.0)
MCV: 93.2 fL (ref 80.0–100.0)
Platelets: 328 10*3/uL (ref 150–400)
RBC: 3.81 MIL/uL — ABNORMAL LOW (ref 3.87–5.11)
RDW: 13.2 % (ref 11.5–15.5)
WBC: 11.7 10*3/uL — ABNORMAL HIGH (ref 4.0–10.5)
nRBC: 0 % (ref 0.0–0.2)

## 2022-03-11 NOTE — Progress Notes (Signed)
Physical Therapy Treatment ?Patient Details ?Name: Laura Wheeler ?MRN: [id number] ?DOB: [birth year] ?Today's Date: 03/11/2022 ? ? ?History of Present Illness Pt is a 61 y.o. female s/p elective R TKA 03/10/22. PMH includes arthritis, SVT. ?  ?PT Comments  ? ? Pt progressing with mobility. Today's session focused on transfer and gait training with RW, pt moving well with min guard for balance, although again limited by nausea/vomiting (RN aware; anesthesia or pain med-related?). Despite this, pt motivated to participate as best she can; reports performing RLE therex throughout the day in recliner. Will continue to follow acutely to address established goals.  ?   ?Recommendations for follow up therapy are one component of a multi-disciplinary discharge planning process, led by the attending physician.  Recommendations may be updated based on patient status, additional functional criteria and insurance authorization. ? ?Follow Up Recommendations ? Follow physician's recommendations for discharge plan and follow up therapies ?  ?  ?Assistance Recommended at Discharge Intermittent Supervision/Assistance  ?Patient can return home with the following A little help with bathing/dressing/bathroom;Assistance with cooking/housework;Assist for transportation;Help with stairs or ramp for entrance ?  ?Equipment Recommendations ? None recommended by PT (owns DME)  ?  ?Recommendations for Other Services   ? ? ?  ?Precautions / Restrictions Precautions ?Precautions: Knee;Fall ?Restrictions ?Weight Bearing Restrictions: Yes ?RLE Weight Bearing: Weight bearing as tolerated  ?  ? ?Mobility ? Bed Mobility ?Overal bed mobility: Needs Assistance ?Bed Mobility: Sit to Supine ?  ?  ?  ?Sit to supine: Min assist ?  ?General bed mobility comments: MinA for RLE management return to supine; pt repositioning self in bed well ?  ? ?Transfers ?Overall transfer level: Needs assistance ?Equipment used: Rolling walker (2 wheels) ?Transfers: Sit  to/from Stand ?Sit to Stand: Min guard ?  ?  ?  ?  ?  ?General transfer comment: reliant on momentum to power into standing; multiple sit<>stand from recliner, BSC (over toilet) and bed to RW with min guard for balance ?  ? ?Ambulation/Gait ?Ambulation/Gait assistance: Min guard ?Gait Distance (Feet): 44 Feet ?Assistive device: Rolling walker (2 wheels) ?Gait Pattern/deviations: Step-to pattern, Step-through pattern, Decreased stride length, Decreased weight shift to right, Knee flexed in stance - right, Decreased dorsiflexion - right, Trunk flexed, Antalgic ?Gait velocity: Decreased ?  ?  ?General Gait Details: Slow, antalgic gait with RW and min guard for balance; frequent standing rest breaks due to nausea; cues for heel-to-toe gait pattern and upright posture; pt declined further distance secondary to nausea ? ? ?Stairs ?  ?  ?  ?  ?  ? ? ?Wheelchair Mobility ?  ? ?Modified Rankin (Stroke Patients Only) ?  ? ? ?  ?Balance Overall balance assessment: Needs assistance ?  ?Sitting balance-Leahy Scale: Good ?Sitting balance - Comments: able to perform pericare sitting on toilet ?  ?Standing balance support: Reliant on assistive device for balance ?Standing balance-Leahy Scale: Poor ?  ?  ?  ?  ?  ?  ?  ?  ?  ?  ?  ?  ?  ? ?  ?Cognition Arousal/Alertness: Awake/alert ?Behavior During Therapy: [employer] for tasks assessed/performed, Anxious ?Overall Cognitive Status: Within Functional Limits for tasks assessed ?  ?  ?  ?  ?  ?  ?  ?  ?  ?  ?  ?  ?  ?  ?  ?  ?  ?  ?  ? ?  ?Exercises   ? ?  ?General Comments General comments (skin  integrity, edema, etc.): pt reports sitting up in chair all day, going to [facility] with NT, working on therex with recliner leg rest up/down. pt left in bed with ice machine on R knee, knee extended with yellow foam on lower leg/ankle to encourage knee ext ?  ?  ? ?Pertinent Vitals/Pain Pain Assessment ?Pain Assessment: Faces ?Faces Pain Scale: Hurts even more ?Pain Location: R knee ?Pain Descriptors /  Indicators: Grimacing, Guarding ?Pain Intervention(s): Limited activity within patient's tolerance, Monitored during session, Ice applied  ? ? ?Home Living   ?  ?  ?  ?  ?  ?  ?  ?  ?  ?   ?  ?Prior Function    ?  ?  ?   ? ?PT Goals (current goals can now be found in the care plan section) Progress towards PT goals: Progressing toward goals ? ?  ?Frequency ? ? ? 7X/week ? ? ? ?  ?PT Plan Current plan remains appropriate  ? ? ?Co-evaluation   ?  ?  ?  ?  ? ?  ?AM-PAC PT "6 Clicks" Mobility   ?Outcome Measure ? Help needed turning from your back to your side while in a flat bed without using bedrails?: A Little ?Help needed moving from lying on your back to sitting on the side of a flat bed without using bedrails?: A Little ?Help needed moving to and from a bed to a chair (including a wheelchair)?: A Little ?Help needed standing up from a chair using your arms (e.g., wheelchair or bedside chair)?: A Little ?Help needed to walk in hospital room?: A Little ?Help needed climbing 3-5 steps with a railing? : A Little ?6 Click Score: 18 ? ?  ?End of Session Equipment Utilized During Treatment: Gait belt ?Activity Tolerance: Patient tolerated treatment well;Other (comment) (limited by nausea/vomiting) ?Patient left: in bed;with call bell/phone within reach;with bed alarm set ?Nurse Communication: Mobility status ?PT Visit Diagnosis: Other abnormalities of gait and mobility (R26.89);Muscle weakness (generalized) (M62.81);Pain ?Pain - Right/Left: Right ?Pain - part of body: Knee ?  ? ? ?Time: [id number] ?PT Time Calculation (min) (ACUTE ONLY): 31 min ? ?Charges:  $Gait Training: 8-22 mins ?$Therapeutic Activity: 8-22 mins          ?          ? ?Laura Wheeler, PT, DPT ?Acute Rehabilitation Services  ?Pager [phone] ?Office [phone] ? ?Laura Wheeler ?03/11/2022, 5:06 PM ? ?

## 2022-03-11 NOTE — Progress Notes (Signed)
Subjective: ?1 Day Post-Op Procedure(s) (LRB): ?Right TOTAL KNEE ARTHROPLASTY (Right) ?Patient reports pain as moderate.  Reports nausea this am. ? ?Objective: ?Vital signs in last 24 hours: ?Temp:  [97.5 ?F (36.4 ?C)-98.5 ?F (36.9 ?C)] 98.5 ?F (36.9 ?C) (03/22 0756) ?Pulse Rate:  [72-121] 80 (03/22 0756) ?Resp:  [9-21] 16 (03/22 0756) ?BP: (85-143)/(42-88) 120/59 (03/22 0756) ?SpO2:  [94 %-100 %] 99 % (03/22 0756) ?Weight:  [74.8 kg] 74.8 kg (03/21 1154) ? ?Intake/Output from previous day: ?03/21 0701 - 03/22 0700 ?In: 1400 [I.V.:1200; IV Piggyback:200] ?Out: 2885 [Urine:2785; Blood:100] ?Intake/Output this shift: ?No intake/output data recorded. ? ?No results for input(s): HGB in the last 72 hours. ?No results for input(s): WBC, RBC, HCT, PLT in the last 72 hours. ?No results for input(s): NA, K, CL, CO2, BUN, CREATININE, GLUCOSE, CALCIUM in the last 72 hours. ?No results for input(s): LABPT, INR in the last 72 hours. ? ?Sensation intact distally ?Intact pulses distally ?Dorsiflexion/Plantar flexion intact ?Incision: scant drainage ?Compartment soft ? ? ?Assessment/Plan: ?1 Day Post-Op Procedure(s) (LRB): ?Right TOTAL KNEE ARTHROPLASTY (Right) ?Up with therapy ?Plan for discharge tomorrow ?Discharge home with home health ? ? ? ? ? ?Laura Wheeler ?03/11/2022, 8:07 AM ? ?

## 2022-03-11 NOTE — Evaluation (Signed)
Physical Therapy Evaluation ?Patient Details ?Name: Laura Wheeler ?MRN: [id number] ?DOB: Jun 13, 1961 ?Today's Date: 03/11/2022 ? ?History of Present Illness ? Pt is a 61 y.o. female s/p elective R TKA 03/10/22. PMH includes arthritis, SVT.  ?Clinical Impression ? Pt presents with an overall decrease in functional mobility secondary to above. PTA, pt independent, working, enjoys Building control surveyor, lives with husband. Initiated TKA educ re: precautions, positioning, therex, and importance of mobility. Today, pt able to initiate standing and gait with RW and minA; mobility limited by nausea/vomiting; expect pt to progress well with mobility once this is controlled. Pt would benefit from continued acute PT services to maximize functional mobility and independence prior to d/c home.     ? ?Recommendations for follow up therapy are one component of a multi-disciplinary discharge planning process, led by the attending physician.  Recommendations may be updated based on patient status, additional functional criteria and insurance authorization. ? ?Follow Up Recommendations Follow physician's recommendations for discharge plan and follow up therapies ? ?  ?Assistance Recommended at Discharge Intermittent Supervision/Assistance  ?Patient can return home with the following ? A little help with bathing/dressing/bathroom;Assistance with cooking/housework;Assist for transportation;Help with stairs or ramp for entrance ? ?  ?Equipment Recommendations None recommended by PT (pt owns DME)  ?Recommendations for Other Services ?    ?  ?Functional Status Assessment Patient has had a recent decline in their functional status and demonstrates the ability to make significant improvements in function in a reasonable and predictable amount of time.  ? ?  ?Precautions / Restrictions Precautions ?Precautions: Knee;Fall ?Restrictions ?Weight Bearing Restrictions: Yes ?RLE Weight Bearing: Weight bearing as tolerated  ? ?  ? ?Mobility ? Bed  Mobility ?Overal bed mobility: Needs Assistance ?Bed Mobility: Supine to Sit, Rolling ?Rolling: Modified independent (Device/Increase time) ?  ?Supine to sit: Min assist ?  ?  ?General bed mobility comments: Mod indep with bed rail to roll to R-side for removal of bed pain. MinA for RLE management, increased time and effort due to pain and nausea; pt vomitted once sitting EOB ?  ? ?Transfers ?Overall transfer level: Needs assistance ?Equipment used: Rolling walker (2 wheels) ?Transfers: Sit to/from Stand, Bed to chair/wheelchair/BSC ?Sit to Stand: Min assist ?  ?Step pivot transfers: Min guard ?  ?  ?  ?General transfer comment: Cues for hand placement and sequencing, minA for trunk elevation standing to RW; pivotal steps to recliner with min guard ?  ? ?Ambulation/Gait ?  ?  ?  ?  ?  ?  ?  ?General Gait Details: deferred secondary to nausea/vomiting ? ?Stairs ?  ?  ?  ?  ?  ? ?Wheelchair Mobility ?  ? ?Modified Rankin (Stroke Patients Only) ?  ? ?  ? ?Balance Overall balance assessment: Needs assistance ?  ?Sitting balance-Leahy Scale: Good ?  ?  ?Standing balance support: Reliant on assistive device for balance ?Standing balance-Leahy Scale: Poor ?  ?  ?  ?  ?  ?  ?  ?  ?  ?  ?  ?  ?   ? ? ? ?Pertinent Vitals/Pain Pain Assessment ?Pain Assessment: Faces ?Faces Pain Scale: Hurts little more ?Pain Location: R knee ?Pain Descriptors / Indicators: Grimacing, Guarding ?Pain Intervention(s): Monitored during session, RN gave pain meds during session  ? ? ?Home Living Family/patient expects to be discharged to:: Private residence ?Living Arrangements: Spouse/significant other ?Available Help at Discharge: Family;Available 24 hours/day ?Type of Home: House ?Home Access: Stairs to enter ?Entrance Stairs-Rails: None ?Entrance  Stairs-Number of Steps: 2-3 ?  ?Home Layout: Two level;Able to live on main level with bedroom/bathroom ?Home Equipment: Conservation officer, nature (2 wheels);Cane - quad;BSC/3in1;Shower seat;Tub bench ?Additional  Comments: Husband taking time off work Friday-Sunday to assist  ?  ?Prior Function Prior Level of Function : Independent/Modified Independent ?  ?  ?  ?  ?  ?  ?Mobility Comments: Independent without DME, works as Government social research officer, enjoys pilates ?  ?  ? ? ?Hand Dominance  ?   ? ?  ?Extremity/Trunk Assessment  ? Upper Extremity Assessment ?Upper Extremity Assessment: Overall WFL for tasks assessed ?  ? ?Lower Extremity Assessment ?Lower Extremity Assessment: RLE deficits/detail ?RLE Deficits / Details: s/p R TKA with expected post-op pain and weakness; tolerating near 90' R knee flexion sitting EOB; L knee ext and hip flex <3/5 ?  ? ?Cervical / Trunk Assessment ?Cervical / Trunk Assessment: Normal  ?Communication  ? Communication: No difficulties  ?Cognition Arousal/Alertness: Awake/alert ?Behavior During Therapy: [facility] for tasks assessed/performed ?Overall Cognitive Status: Within Functional Limits for tasks assessed ?  ?  ?  ?  ?  ?  ?  ?  ?  ?  ?  ?  ?  ?  ?  ?  ?  ?  ?  ? ?  ?General Comments General comments (skin integrity, edema, etc.): Nausea/vomiting with mobility; RN present to give zofran and pain meds. pt motivated to participate and regain PLOF. initiated TKA educ re: precautions, positioning (especially resting with knee straight), therex, edema control, activity recommendations. Per RN, Dr. Ninfa Linden states he does not want use of zero degree bone foam. ? ?  ?Exercises Other Exercises ?Other Exercises: [employer] HEP handout (Access Code [email or web address]) provided - pt reports, "I'm too nauseous to try," briefly tolerating ankle pumps and AAROM LAQ  ? ?Assessment/Plan  ?  ?PT Assessment Patient needs continued PT services  ?PT Problem List Decreased strength;Decreased range of motion;Decreased activity tolerance;Decreased balance;Decreased mobility;Decreased knowledge of use of DME;Decreased safety awareness;Decreased knowledge of precautions;Pain ? ?   ?  ?PT Treatment Interventions DME instruction;Gait  training;Stair training;Functional mobility training;Therapeutic activities;Therapeutic exercise;Balance training;Patient/family education   ? ?PT Goals (Current goals can be found in the Care Plan section)  ?Acute Rehab PT Goals ?Patient Stated Goal: Return to work and exercise ?PT Goal Formulation: With patient ?Time For Goal Achievement: 03/25/22 ?Potential to Achieve Goals: Good ? ?  ?Frequency 7X/week ?  ? ? ?Co-evaluation   ?  ?  ?  ?  ? ? ?  ?AM-PAC PT "6 Clicks" Mobility  ?Outcome Measure Help needed turning from your back to your side while in a flat bed without using bedrails?: A Little ?Help needed moving from lying on your back to sitting on the side of a flat bed without using bedrails?: A Little ?Help needed moving to and from a bed to a chair (including a wheelchair)?: A Little ?Help needed standing up from a chair using your arms (e.g., wheelchair or bedside chair)?: A Little ?Help needed to walk in hospital room?: A Little ?Help needed climbing 3-5 steps with a railing? : A Little ?6 Click Score: 18 ? ?  ?End of Session Equipment Utilized During Treatment: Gait belt ?Activity Tolerance: Patient tolerated treatment well;Other (comment) (nausea/vomiting) ?Patient left: in chair;with call bell/phone within reach;with chair alarm set ?Nurse Communication: Mobility status ?PT Visit Diagnosis: Other abnormalities of gait and mobility (R26.89);Muscle weakness (generalized) (M62.81);Pain ?Pain - Right/Left: Right ?Pain - part of body: Knee ?  ? ?Time:  [id number] ?PT Time Calculation (min) (ACUTE ONLY): 28 min ? ? ?Charges:   PT Evaluation ?$PT Eval Low Complexity: 1 Low ?  ?  ?   ?Mabeline Caras, PT, DPT ?Acute Rehabilitation Services  ?Pager [phone] ?Office [phone] ? ?Derry Lory ?03/11/2022, 8:56 AM ? ?

## 2022-03-11 NOTE — Discharge Instructions (Signed)

## 2022-03-12 DIAGNOSIS — M1711 Unilateral primary osteoarthritis, right knee: Secondary | ICD-10-CM | POA: Diagnosis not present

## 2022-03-12 LAB — CBC
HCT: 35.6 % — ABNORMAL LOW (ref 36.0–46.0)
Hemoglobin: 11.7 g/dL — ABNORMAL LOW (ref 12.0–15.0)
MCH: 30.2 pg (ref 26.0–34.0)
MCHC: 32.9 g/dL (ref 30.0–36.0)
MCV: 92 fL (ref 80.0–100.0)
Platelets: 309 10*3/uL (ref 150–400)
RBC: 3.87 MIL/uL (ref 3.87–5.11)
RDW: 13.3 % (ref 11.5–15.5)
WBC: 15.9 10*3/uL — ABNORMAL HIGH (ref 4.0–10.5)
nRBC: 0 % (ref 0.0–0.2)

## 2022-03-12 MED ORDER — METHOCARBAMOL 500 MG PO TABS: 500.0000 mg | ORAL_TABLET | Freq: Four times a day (QID) | ORAL | 1 refills | Status: DC | PRN

## 2022-03-12 MED ORDER — ASPIRIN 81 MG PO CHEW: 81.0000 mg | CHEWABLE_TABLET | Freq: Two times a day (BID) | ORAL | 0 refills | Status: DC

## 2022-03-12 MED ORDER — OXYCODONE HCL 5 MG PO TABS: 5.0000 mg | ORAL_TABLET | Freq: Four times a day (QID) | ORAL | 0 refills | Status: DC | PRN

## 2022-03-12 MED ORDER — POLYETHYLENE GLYCOL 3350 17 G PO PACK
17.0000 g | PACK | Freq: Once | ORAL | Status: AC
Start: 2022-03-12 — End: 2022-03-12
  Administered 2022-03-12: 17 g via ORAL
  Filled 2022-03-12: qty 1

## 2022-03-12 MED ORDER — ONDANSETRON 4 MG PO TBDP
4.0000 mg | ORAL_TABLET | Freq: Three times a day (TID) | ORAL | 0 refills | Status: DC | PRN
Start: 2022-03-12 — End: 2022-05-11

## 2022-03-12 NOTE — Discharge Summary (Signed)
Patient ID: ?Laura Wheeler ?MRN: [id number] ?DOB/AGE: 12/24/1960 61 y.o. ? ?Admit date: 03/10/2022 ?Discharge date: 03/12/2022 ? ?Admission Diagnoses:  ?Principal Problem: ?  Unilateral primary osteoarthritis, right knee ?Active Problems: ?  DJD (degenerative joint disease) ?  Status post total right knee replacement ? ? ?Discharge Diagnoses:  ?Same ? ?Past Medical History:  ?Diagnosis Date  ? Arthritis   ? "knees" (05/19/2013)  ? Bronchitis   ? History of cardiac radiofrequency ablation 04/20/2013  ? Hx of echocardiogram   ? Echo (12/15):  EF 60-65%, no RWMA, Gr 1 DD  ? SVT (supraventricular tachycardia) ([place])   ? WPW (Wolff-Parkinson-White syndrome) ~ 1983  ? a. s/p ablation 05/20/13.  ? ? ?Surgeries: Procedure(s): ?Right TOTAL KNEE ARTHROPLASTY on 03/10/2022 ?  ?Consultants:  ? ?Discharged Condition: Improved ? ?Hospital Course: Laura Wheeler is an 61 y.o. female who was admitted 03/10/2022 for operative treatment ofUnilateral primary osteoarthritis, right knee. Patient has severe unremitting pain that affects sleep, daily activities, and work/hobbies. After pre-op clearance the patient was taken to the operating room on 03/10/2022 and underwent  Procedure(s): ?Right TOTAL KNEE ARTHROPLASTY.   ? ?Patient was given perioperative antibiotics:  ?Anti-infectives (From admission, onward)  ? ? Start     Dose/Rate Route Frequency Ordered Stop  ? 03/10/22 2000  ceFAZolin (ANCEF) IVPB 1 g/50 mL premix       ? 1 g ?100 mL/hr over 30 Minutes Intravenous Every 6 hours 03/10/22 1628 03/11/22 0217  ? 03/10/22 1300  ceFAZolin (ANCEF) IVPB 2g/100 mL premix       ? 2 g ?200 mL/hr over 30 Minutes Intravenous On call to O.R. 03/10/22 1148 03/10/22 1433  ? 03/10/22 1153  ceFAZolin (ANCEF) 2-4 GM/100ML-% IVPB       ?Note to Pharmacy: Rocky Morel D: cabinet override  ?    03/10/22 1153 03/10/22 1446  ? ?  ?  ? ?Patient was given sequential compression devices, early ambulation, and chemoprophylaxis to prevent DVT. ? ?Patient  benefited maximally from hospital stay and there were no complications.   ? ?Recent vital signs: Patient Vitals for the past 24 hrs: ? BP Temp Temp src Pulse Resp SpO2  ?03/12/22 1153 113/62 98 ?F (36.7 ?C) Oral 92 17 96 %  ?03/12/22 0833 121/60 97.8 ?F (36.6 ?C) Oral 89 18 98 %  ?03/12/22 0535 (!) 104/55 98 ?F (36.7 ?C) Oral 88 18 97 %  ?03/11/22 2107 133/64 98.5 ?F (36.9 ?C) Oral 99 18 98 %  ?  ? ?Recent laboratory studies:  ?Recent Labs  ?  03/11/22 ?0709 03/12/22 ?0340  ?WBC 11.7* 15.9*  ?HGB 12.0 11.7*  ?HCT 35.5* 35.6*  ?PLT 328 309  ? ? ? ?Discharge Medications:   ?Allergies as of 03/12/2022   ? ?   Reactions  ? Dog Epithelium Allergy Skin Test Itching, Shortness Of Breath  ? Other reaction(s): Cough, Respiratory Distress  ? Horse Epithelium Shortness Of Breath  ? Other reaction(s): Cough, Respiratory Distress  ? Sulfa Antibiotics Itching, Other (See Comments)  ? Doesn't remember reaction - 20 years ago  ? ?  ? ?  ?Medication List  ?  ? ?TAKE these medications   ? ?aspirin 81 MG chewable tablet ?Chew 1 tablet (81 mg total) by mouth 2 (two) times daily. ?  ?Azelastine-Fluticasone 137-50 MCG/ACT Susp ?Place 1 spray into both nostrils 2 (two) times daily. ?  ?EPINEPHrine 0.3 mg/0.3 mL Soaj injection ?Commonly known as: EPI-PEN ?Inject 0.3 mg into the muscle as needed  for anaphylaxis. ?  ?levocetirizine 5 MG tablet ?Commonly known as: XYZAL ?Take 5 mg by mouth at bedtime. ?  ?MELOXICAM PO ?Take 1 tablet by mouth at bedtime as needed (Knee pain). ?  ?methocarbamol 500 MG tablet ?Commonly known as: ROBAXIN ?Take 1 tablet (500 mg total) by mouth every 6 (six) hours as needed for muscle spasms. ?  ?MULTIVITAMINS PO ?Take 1 Package by mouth daily. Shaklee ? ?Vita pack ?  ?ondansetron 4 MG disintegrating tablet ?Commonly known as: ZOFRAN-ODT ?Take 1 tablet (4 mg total) by mouth every 8 (eight) hours as needed for nausea or vomiting. ?  ?OVER THE COUNTER MEDICATION ?Take 2 tablets by mouth daily. Shaklee ? [facility] ?  ?OVER THE COUNTER MEDICATION ?Take 1 tablet by mouth daily as needed (bronchitis). Shaklee  ? supplement for vitamin C ?  ?OVER THE COUNTER MEDICATION ?Take 5 tablets by mouth daily. Shaklee Osteomatrix ?  ?oxyCODONE 5 MG immediate release tablet ?Commonly known as: Oxy IR/ROXICODONE ?Take 1-2 tablets (5-10 mg total) by mouth every 6 (six) hours as needed for moderate pain (pain score 4-6). ?  ?Protein Powd ?Take 2 Scoops by mouth daily. Shaklee life shake ?Add collagen ?  ?Symbicort 80-4.5 MCG/ACT inhaler ?Generic drug: budesonide-formoterol ?Inhale 2 puffs into the lungs daily as needed (bronchitis). ?  ?TURMERIC PO ?Take 1 tablet by mouth daily. Shaklee ?  ?valACYclovir 500 MG tablet ?Commonly known as: VALTREX ?Take 500 mg by mouth daily as needed (Fever blister). ?  ? ?  ? ?  ?  ? ? ?  ?Durable Medical Equipment  ?(From admission, onward)  ?  ? ? ?  ? ?  Start     Ordered  ? 03/10/22 1809  DME 3 n 1  Once       ? 03/10/22 1808  ? 03/10/22 1809  DME Walker rolling  Once       ?Question Answer Comment  ?Walker: With 5 Inch Wheels   ?Patient needs a walker to treat with the following condition Status post total right knee replacement   ?  ? 03/10/22 1808  ? ?  ?  ? ?  ? ? ?Diagnostic Studies: DG Knee Right Port ? ?Result Date: 03/10/2022 ?CLINICAL DATA:  Status post right total knee arthroplasty EXAM: PORTABLE RIGHT KNEE - 1-2 VIEW COMPARISON:  09/16/2021 right knee radiographs FINDINGS: Well-positioned right distal femoral and proximal right tibial prostheses. No bone fracture or dislocation. No suspicious focal osseous lesions. Expected soft tissue gas within and surrounding the right knee joint. Midline anterior vertical right knee skin staples. IMPRESSION: Satisfactory immediate postoperative appearance status post right total knee arthroplasty. Electronically Signed   By: Ilona Sorrel M.D.   On: 03/10/2022 17:02   ? ?Disposition: Discharge disposition: 01-Home or Self Care ? ? ? ? ? ? ? ? ? Follow-up  Information   ? ? Mcarthur Rossetti, MD Follow up in 2 week(s).   ?Specialty: Orthopedic Surgery ?Contact information: ?[address] ?[place] [address] ?[phone] ? ? ?  ?  ? ? [facility], [place] Follow up.   ?Specialty: Home Health Services ?Why: home health PT services will be provided by [facility], start of care within 48 hours post discharge ?Contact information: ?[place] [address] ?[address] ?[place] [address] ?[phone] ? ? ?  ?  ? ?  ?  ? ?  ? ? ? ?Signed: ?Mcarthur Rossetti ?03/12/2022, 6:05 PM ? ? ? ?

## 2022-03-12 NOTE — Progress Notes (Signed)
Physical Therapy Treatment ?Patient Details ?Name: Laura Wheeler ?MRN: [id number] ?DOB: [birth year] ?Today's Date: 03/12/2022 ? ? ?History of Present Illness Pt is a 61 y.o. female s/p elective R TKA 03/10/22. PMH includes arthritis, SVT. ?  ?PT Comments  ? ? Pt progressing with mobility; tolerated gait and stair training with supervision to min guard. Pt preparing d/c home this evening, will have necessary assist from husband and owns DME. Reviewed TKA education; pt reports no further questions or concerns.  ?   ?Recommendations for follow up therapy are one component of a multi-disciplinary discharge planning process, led by the attending physician.  Recommendations may be updated based on patient status, additional functional criteria and insurance authorization. ? ?Follow Up Recommendations ? Follow physician's recommendations for discharge plan and follow up therapies ?  ?  ?Assistance Recommended at Discharge Intermittent Supervision/Assistance  ?Patient can return home with the following A little help with bathing/dressing/bathroom;Assistance with cooking/housework;Assist for transportation;Help with stairs or ramp for entrance ?  ?Equipment Recommendations ? None recommended by PT  ?  ?Recommendations for Other Services   ? ? ?  ?Precautions / Restrictions Precautions ?Precautions: Knee;Fall;Other (comment) ?Precaution Comments: nausea/vomiting ?Restrictions ?Weight Bearing Restrictions: Yes ?RLE Weight Bearing: Weight bearing as tolerated  ?  ? ?Mobility ? Bed Mobility ?Overal bed mobility: Modified Independent ?Bed Mobility: Supine to Sit ?  ?  ?  ?  ?  ?General bed mobility comments: Received sitting in recliner ?  ? ?Transfers ?Overall transfer level: Needs assistance ?Equipment used: Rolling walker (2 wheels) ?Transfers: Sit to/from Stand ?Sit to Stand: Supervision ?  ?  ?  ?  ?  ?General transfer comment: reliant on momentum to power into standing; multiple sit<>stand from recliner, BSC (over toilet)  and bed to RW with supervision; good technique without cues ?  ? ?Ambulation/Gait ?Ambulation/Gait assistance: Supervision ?Gait Distance (Feet): 140 Feet ?Assistive device: Rolling walker (2 wheels) ?Gait Pattern/deviations: Step-through pattern, Decreased stride length, Decreased weight shift to right, Decreased dorsiflexion - right, Trunk flexed, Antalgic ?Gait velocity: Decreased ?  ?  ?General Gait Details: Slow, antalgic gait with RW and supervision for balance; frequent standing rest breaks due to nausea; improving ability to perform step through and heel-to-toe gait pattern; cues to look up, pt reports this makes nausea worse ? ? ?Stairs ?Stairs: Yes ?Stairs assistance: Min guard ?Stair Management: One rail Right, Step to pattern, Forwards ?Number of Stairs: 2 ?General stair comments: ascend/descend 2 steps with BUE rail support, educ on technique/sequencing; educ pt on where husband should guard/assist; pt declined additional trial with HHA to recreate home set-up since no rail on 2 steps into home ? ? ?Wheelchair Mobility ?  ? ?Modified Rankin (Stroke Patients Only) ?  ? ? ?  ?Balance Overall balance assessment: Needs assistance ?Sitting-balance support: No upper extremity supported ?Sitting balance-Leahy Scale: Good ?Sitting balance - Comments: able to perform pericare sitting on toilet ?  ?Standing balance support: No upper extremity supported ?Standing balance-Leahy Scale: Fair ?Standing balance comment: can static stand without UE support to wash hands at sink, don mask; reliant on RW with ambulation ?  ?  ?  ?  ?  ?  ?  ?  ?  ?  ?  ?  ? ?  ?Cognition Arousal/Alertness: Awake/alert ?Behavior During Therapy: [facility] for tasks assessed/performed, Anxious ?Overall Cognitive Status: Within Functional Limits for tasks assessed ?  ?  ?  ?  ?  ?  ?  ?  ?  ?  ?  ?  ?  ?  ?  ?  ?  General Comments: question if nausea more related to anxiety during therapy sessions(?) ?  ?  ? ?  ?Exercises Other Exercises ?Other  Exercises: R knee flex seated in recliner with overpressure from LLE to stretch, partial LAQ ? ?  ?General Comments General comments (skin integrity, edema, etc.): pt remains limited by nausea despite pre-medication with zofran, no vomiting this session; RN reports pt ambulated to bathroom with her this afternoon with no c/o nausea. reviewed TKA educ in preparation for d/c home ?  ?  ? ?Pertinent Vitals/Pain Pain Assessment ?Pain Assessment: Faces ?Faces Pain Scale: Hurts even more ?Pain Location: R knee ?Pain Descriptors / Indicators: Grimacing, Guarding ?Pain Intervention(s): Monitored during session, Limited activity within patient's tolerance, Ice applied  ? ? ?Home Living   ?  ?  ?  ?  ?  ?  ?  ?  ?  ?   ?  ?Prior Function    ?  ?  ?   ? ?PT Goals (current goals can now be found in the care plan section) Progress towards PT goals: Progressing toward goals ? ?  ?Frequency ? ? ? 7X/week ? ? ? ?  ?PT Plan Current plan remains appropriate  ? ? ?Co-evaluation   ?  ?  ?  ?  ? ?  ?AM-PAC PT "6 Clicks" Mobility   ?Outcome Measure ? Help needed turning from your back to your side while in a flat bed without using bedrails?: None ?Help needed moving from lying on your back to sitting on the side of a flat bed without using bedrails?: None ?Help needed moving to and from a bed to a chair (including a wheelchair)?: A Little ?Help needed standing up from a chair using your arms (e.g., wheelchair or bedside chair)?: A Little ?Help needed to walk in hospital room?: A Little ?Help needed climbing 3-5 steps with a railing? : A Little ?6 Click Score: 20 ? ?  ?End of Session Equipment Utilized During Treatment: Gait belt ?Activity Tolerance: Patient tolerated treatment well;Other (comment) (limited by nausea) ?Patient left: in chair;with call bell/phone within reach;with chair alarm set ?Nurse Communication: Mobility status ?PT Visit Diagnosis: Other abnormalities of gait and mobility (R26.89);Muscle weakness (generalized)  (M62.81);Pain ?Pain - Right/Left: Right ?Pain - part of body: Knee ?  ? ? ?Time: [id number] ?PT Time Calculation (min) (ACUTE ONLY): 35 min ? ?Charges:  $Gait Training: 8-22 mins ?$Self Care/Home Management: 8-22          ?          ? ?Mabeline Caras, PT, DPT ?Acute Rehabilitation Services  ?Pager [phone] ?Office [phone] ? ?Derry Lory ?03/12/2022, 5:22 PM ? ?

## 2022-03-12 NOTE — Progress Notes (Signed)
Physical Therapy Treatment ?Patient Details ?Name: Laura Wheeler ?MRN: [id number] ?DOB: [birth year] ?Today's Date: 03/12/2022 ? ? ?History of Present Illness Pt is a 61 y.o. female s/p elective R TKA 03/10/22. PMH includes arthritis, SVT. ?  ?PT Comments  ? ? Pt progressing with mobility. Today's session focused on transfer and gait training with RW; pt remains limited by nausea despite pre-medication with antiemetic. Despite this, pt moving well and preparing for d/c home this evening. Will plan for additional session today for stair training.  ?   ?Recommendations for follow up therapy are one component of a multi-disciplinary discharge planning process, led by the attending physician.  Recommendations may be updated based on patient status, additional functional criteria and insurance authorization. ? ?Follow Up Recommendations ? Follow physician's recommendations for discharge plan and follow up therapies ?  ?  ?Assistance Recommended at Discharge Intermittent Supervision/Assistance  ?Patient can return home with the following A little help with bathing/dressing/bathroom;Assistance with cooking/housework;Assist for transportation;Help with stairs or ramp for entrance ?  ?Equipment Recommendations ? None recommended by PT (owns DME)  ?  ?Recommendations for Other Services   ? ? ?  ?Precautions / Restrictions Precautions ?Precautions: Knee;Fall ?Restrictions ?Weight Bearing Restrictions: Yes ?RLE Weight Bearing: Weight bearing as tolerated  ?  ? ?Mobility ? Bed Mobility ?Overal bed mobility: Modified Independent ?Bed Mobility: Supine to Sit ?  ?  ?  ?  ?  ?General bed mobility comments: HOB flat, increased time and effort, able to come to R-side of bed without physical assist ?  ? ?Transfers ?Overall transfer level: Needs assistance ?Equipment used: Rolling walker (2 wheels) ?Transfers: Sit to/from Stand ?Sit to Stand: Min guard ?  ?  ?  ?  ?  ?General transfer comment: reliant on momentum to power into standing;  multiple sit<>stand from recliner, BSC (over toilet) and bed to RW with min guard for balance ?  ? ?Ambulation/Gait ?Ambulation/Gait assistance: Min guard, Supervision ?Gait Distance (Feet): 102 Feet ?Assistive device: Rolling walker (2 wheels) ?Gait Pattern/deviations: Step-to pattern, Step-through pattern, Decreased stride length, Decreased weight shift to right, Knee flexed in stance - right, Decreased dorsiflexion - right, Trunk flexed, Antalgic ?Gait velocity: Decreased ?  ?  ?General Gait Details: Slow, antalgic gait with RW and min guard for balance, progressing to supervision; frequent standing rest breaks due to nausea; cues for heel-to-toe gait pattern, step through, upright posture; further distance limited by nausea ? ? ?Stairs ?  ?  ?  ?  ?  ? ? ?Wheelchair Mobility ?  ? ?Modified Rankin (Stroke Patients Only) ?  ? ? ?  ?Balance Overall balance assessment: Needs assistance ?  ?Sitting balance-Leahy Scale: Good ?Sitting balance - Comments: able to perform pericare sitting on toilet ?  ?Standing balance support: No upper extremity supported ?Standing balance-Leahy Scale: Fair ?Standing balance comment: can static stand without UE support to wash hands at sink, don mask; reliant on RW with ambulation ?  ?  ?  ?  ?  ?  ?  ?  ?  ?  ?  ?  ? ?  ?Cognition Arousal/Alertness: Awake/alert ?Behavior During Therapy: [facility] for tasks assessed/performed, Anxious ?Overall Cognitive Status: Within Functional Limits for tasks assessed ?  ?  ?  ?  ?  ?  ?  ?  ?  ?  ?  ?  ?  ?  ?  ?  ?  ?  ?  ? ?  ?Exercises Other Exercises ?Other Exercises: R knee flex  seated in recliner with overpressure from LLE to stretch, partial LAQ ? ?  ?General Comments General comments (skin integrity, edema, etc.): pt remains limited by nausea despite pre-medication with zofran, no vomiting this session. reviewed TKA educ ?  ?  ? ?Pertinent Vitals/Pain Pain Assessment ?Pain Assessment: Faces ?Faces Pain Scale: Hurts even more ?Pain Location: R  knee ?Pain Descriptors / Indicators: Grimacing, Guarding ?Pain Intervention(s): Limited activity within patient's tolerance, Monitored during session, Premedicated before session, Ice applied  ? ? ?Home Living   ?  ?  ?  ?  ?  ?  ?  ?  ?  ?   ?  ?Prior Function    ?  ?  ?   ? ?PT Goals (current goals can now be found in the care plan section) Progress towards PT goals: Progressing toward goals ? ?  ?Frequency ? ? ? 7X/week ? ? ? ?  ?PT Plan Current plan remains appropriate  ? ? ?Co-evaluation   ?  ?  ?  ?  ? ?  ?AM-PAC PT "6 Clicks" Mobility   ?Outcome Measure ? Help needed turning from your back to your side while in a flat bed without using bedrails?: None ?Help needed moving from lying on your back to sitting on the side of a flat bed without using bedrails?: None ?Help needed moving to and from a bed to a chair (including a wheelchair)?: A Little ?Help needed standing up from a chair using your arms (e.g., wheelchair or bedside chair)?: A Little ?Help needed to walk in hospital room?: A Little ?Help needed climbing 3-5 steps with a railing? : A Little ?6 Click Score: 20 ? ?  ?End of Session Equipment Utilized During Treatment: Gait belt ?Activity Tolerance: Patient tolerated treatment well;Other (comment) (limited by nausea) ?Patient left: in chair;with call bell/phone within reach;with chair alarm set ?Nurse Communication: Mobility status ?PT Visit Diagnosis: Other abnormalities of gait and mobility (R26.89);Muscle weakness (generalized) (M62.81);Pain ?Pain - Right/Left: Right ?Pain - part of body: Knee ?  ? ? ?Time: [phone] ?PT Time Calculation (min) (ACUTE ONLY): 31 min ? ?Charges:  $Gait Training: 8-22 mins ?$Therapeutic Activity: 8-22 mins          ?          ? ?Mabeline Caras, PT, DPT ?Acute Rehabilitation Services  ?Pager [phone] ?Office [phone] ? ?Derry Lory ?03/12/2022, 11:00 AM ? ?

## 2022-03-12 NOTE — TOC Transition Note (Signed)
Transition of Care (TOC) - CM/SW Discharge Note ? ? ?Patient Details  ?Name: Laura Wheeler ?MRN: [id number] ?Date of Birth: [birth year] ? ?Transition of Care [facility]) CM/SW Contact:  ?Sharin Mons, RN ?Phone Number: ?03/12/2022, 10:33 AM ? ? ?Clinical Narrative:    ?Patient will DC to: home ?Anticipated DC date: 03/12/2022 ?Family notified: yes ?Transport by: car ? ? -  s/p R TKA 03/10/22 ?Per MD patient ready for DC today . RN, patient, patient's family, and [place] aware of DC. Pt without DME needs  ?Pt without Rx med concerns. ? ?Post hospital f/u noted on AVS. ? ?Daughter to provide transportation to home. ? ?RNCM will sign off for now as intervention is no longer needed. Please consult [place] again if new needs arise.  ? ?Final next level of care: [place] ?Barriers to Discharge: No Barriers Identified ? ? ?Patient Goals and CMS Choice ?  ?  ?Choice offered to / list presented to : Patient ? ?Discharge Placement ?  ?           ?  ?  ?  ?  ? ?Discharge Plan and Services ?  ?  ?           ?  ?  ?  ?  ?  ?HH Arranged: PT ?[place] Agency: [facility] ?Date HH Agency Contacted: 03/12/22 ?Time [place]: [id number] spoke with at [place]: Marjory Lies ? ?Social Determinants of Health (SDOH) Interventions ?  ? ? ?Readmission Risk Interventions ?   ? View : No data to display.  ?  ?  ?  ? ? ? ? ? ?

## 2022-03-12 NOTE — Progress Notes (Signed)
Discharge instructions (including medications) discussed with and copy provided to patient/caregiver.  Verbalized understanding of medications and follow up appointments.  Joint care instructions given and discussed.  Patient taken to POV via wc with staff and husband. ?

## 2022-03-12 NOTE — Progress Notes (Signed)
Subjective: ?2 Days Post-Op Procedure(s) (LRB): ?Right TOTAL KNEE ARTHROPLASTY (Right) ?Patient reports pain as moderate.  Says that she feels much better today than yesterday. ? ?Objective: ?Vital signs in last 24 hours: ?Temp:  [98 ?F (36.7 ?C)-99.8 ?F (37.7 ?C)] 98 ?F (36.7 ?C) (03/23 0535) ?Pulse Rate:  [88-99] 88 (03/23 0535) ?Resp:  [16-18] 18 (03/23 0535) ?BP: (104-139)/(55-64) 104/55 (03/23 0535) ?SpO2:  [97 %-100 %] 97 % (03/23 0535) ? ?Intake/Output from previous day: ?03/22 0701 - 03/23 0700 ?In: -  ?Out: 400 [Urine:400] ?Intake/Output this shift: ?No intake/output data recorded. ? ?Recent Labs  ?  03/11/22 ?0709 03/12/22 ?0340  ?HGB 12.0 11.7*  ? ?Recent Labs  ?  03/11/22 ?0709 03/12/22 ?0340  ?WBC 11.7* 15.9*  ?RBC 3.81* 3.87  ?HCT 35.5* 35.6*  ?PLT 328 309  ? ?No results for input(s): NA, K, CL, CO2, BUN, CREATININE, GLUCOSE, CALCIUM in the last 72 hours. ?No results for input(s): LABPT, INR in the last 72 hours. ? ?Sensation intact distally ?Intact pulses distally ?Dorsiflexion/Plantar flexion intact ?Incision: scant drainage ?Compartment soft ? ? ?Assessment/Plan: ?2 Days Post-Op Procedure(s) (LRB): ?Right TOTAL KNEE ARTHROPLASTY (Right) ?Up with therapy ?Discharge home with home health ? ? ? ? ? ?Kathryne Hitch ?03/12/2022, 8:13 AM ? ?

## 2022-03-13 ENCOUNTER — Other Ambulatory Visit: Payer: Self-pay | Admitting: Orthopaedic Surgery

## 2022-03-13 ENCOUNTER — Telehealth: Payer: Self-pay | Admitting: Orthopaedic Surgery

## 2022-03-13 MED ORDER — PROMETHAZINE HCL 12.5 MG PO TABS: 12.5000 mg | ORAL_TABLET | Freq: Four times a day (QID) | ORAL | 1 refills | Status: DC | PRN

## 2022-03-13 NOTE — Telephone Encounter (Signed)
I spoke with patient and advised. She states that she is really not doing well and is having a difficult time. She will take tylenol 500-1000mg  in between Oxycodone doses.  She asked if she could take Meloxicam as well. I advised to try with the tylenol in between first and told her I would ask you about Meloxicam 7.5mg .  She is also taking Zofran, but is still very nauseated. ? ?Please advise if ok for Meloxicam. Anything else that can be done about the nausea? ?

## 2022-03-13 NOTE — Telephone Encounter (Signed)
Could you please advise?

## 2022-03-13 NOTE — Telephone Encounter (Signed)
Pt called wondering if there is something she can take in between her pain medicine. Like tylenol or something? ? ?CB [phone]  ?

## 2022-03-13 NOTE — Telephone Encounter (Signed)
I spoke with patient and advised. She states that the tylenol has really helped. She will call if any continued problems/questions. I did advise that phenergan could make her sleepy and to be careful when taking it.  She expressed understanding. ?

## 2022-03-17 ENCOUNTER — Other Ambulatory Visit: Payer: Self-pay | Admitting: Orthopaedic Surgery

## 2022-03-17 ENCOUNTER — Telehealth: Payer: Self-pay | Admitting: Orthopaedic Surgery

## 2022-03-17 MED ORDER — HYDROMORPHONE HCL 4 MG PO TABS: 4.0000 mg | ORAL_TABLET | ORAL | 0 refills | Status: DC | PRN

## 2022-03-17 NOTE — Telephone Encounter (Signed)
Patient called advised her (PT) came and advised her that her B/P was higher than normal for her. Patient said she is having a lot of pain. Patient said she has cried most of the day because she can not get any relief.  ?Patient said her (PT) said she may need help with pain management. Patient asked how long should she keep her leg in the iceman machine?  ?Patient said on her side and ankle it is extremely sore. Patient asked if there is something she could rub on those places? ?Patient asked if she should change the bandage today. Patient said her (PT) said the bandage looked good. ?Patient said she do not have any more pain medicine to take. The number to contact patient is [phone] ?

## 2022-03-17 NOTE — Telephone Encounter (Signed)
Called and advised pt. She stated understanding  

## 2022-03-19 ENCOUNTER — Other Ambulatory Visit: Payer: Self-pay | Admitting: Orthopaedic Surgery

## 2022-03-23 ENCOUNTER — Ambulatory Visit (INDEPENDENT_AMBULATORY_CARE_PROVIDER_SITE_OTHER): Payer: 59 | Admitting: Orthopaedic Surgery

## 2022-03-23 ENCOUNTER — Encounter: Payer: Self-pay | Admitting: Orthopaedic Surgery

## 2022-03-23 DIAGNOSIS — Z96651 Presence of right artificial knee joint: Secondary | ICD-10-CM

## 2022-03-23 MED ORDER — METHOCARBAMOL 500 MG PO TABS
500.0000 mg | ORAL_TABLET | Freq: Four times a day (QID) | ORAL | 1 refills | Status: DC | PRN
Start: 2022-03-23 — End: 2022-04-23

## 2022-03-23 MED ORDER — HYDROMORPHONE HCL 4 MG PO TABS: 4.0000 mg | ORAL_TABLET | ORAL | 0 refills | Status: DC | PRN

## 2022-03-23 NOTE — Progress Notes (Signed)
The patient will be 2 weeks tomorrow status post a right total knee replacement.  She lacks full extension by few degrees of her right knee and she can only flex to about 80 degrees.  The incision looks good.  The staples are removed and Steri-Strips applied.  Her calf is soft. ? ?We will transition her to outpatient physical therapy at [facility] physical therapy.  I will refill her Dilaudid and methocarbamol.  She will go back to her daily aspirin that she was on before surgery. ? ?We will see her back in 4 weeks to see how she is doing overall from a clinical exam standpoint. ?

## 2022-03-30 ENCOUNTER — Telehealth: Payer: Self-pay | Admitting: Orthopaedic Surgery

## 2022-03-30 NOTE — Telephone Encounter (Signed)
Pt called requesting a call back from [facility]. Pt states she has a question about her medication. Please call pt at [phone]. ?

## 2022-03-30 NOTE — Telephone Encounter (Signed)
Wondering if she can take meloxicam instead of tylenol ?We told her that was fine  ?

## 2022-04-08 ENCOUNTER — Other Ambulatory Visit: Payer: Self-pay | Admitting: Orthopaedic Surgery

## 2022-04-08 ENCOUNTER — Telehealth: Payer: Self-pay | Admitting: Orthopaedic Surgery

## 2022-04-08 MED ORDER — HYDROMORPHONE HCL 4 MG PO TABS: 4.0000 mg | ORAL_TABLET | ORAL | 0 refills | Status: DC | PRN

## 2022-04-08 NOTE — Telephone Encounter (Signed)
Please advise 

## 2022-04-08 NOTE — Telephone Encounter (Signed)
Pt called requesting a refill of hydromorphone. Please send to pharmacy on file. Pt phone number is [phone]. ?

## 2022-04-09 ENCOUNTER — Telehealth: Payer: Self-pay | Admitting: Orthopaedic Surgery

## 2022-04-09 ENCOUNTER — Other Ambulatory Visit: Payer: Self-pay | Admitting: Orthopaedic Surgery

## 2022-04-09 MED ORDER — HYDROMORPHONE HCL 4 MG PO TABS: 4.0000 mg | ORAL_TABLET | ORAL | 0 refills | Status: DC | PRN

## 2022-04-09 NOTE — Telephone Encounter (Signed)
Patient called advised she received a text from the [facility] on [address] stating it's to soon to file Rx Hydromorphone.  Patient said the Rx couldn't be filled until 04/21/2022. ? ?[phone] Pharmacy phone number. ? ?The number to contact patient is [phone] ?

## 2022-04-09 NOTE — Telephone Encounter (Signed)
Pt would like a refill on hydrmorphone ?

## 2022-04-10 NOTE — Telephone Encounter (Signed)
Spoke with pharmacy ?LMOM for patient letting her know they will fix error  ?

## 2022-04-17 ENCOUNTER — Other Ambulatory Visit: Payer: Self-pay | Admitting: Orthopaedic Surgery

## 2022-04-17 ENCOUNTER — Encounter: Payer: Self-pay | Admitting: Orthopaedic Surgery

## 2022-04-17 MED ORDER — GABAPENTIN 300 MG PO CAPS: 300.0000 mg | ORAL_CAPSULE | Freq: Every day | ORAL | 1 refills | Status: DC

## 2022-04-20 ENCOUNTER — Encounter: Payer: Self-pay | Admitting: Orthopaedic Surgery

## 2022-04-20 ENCOUNTER — Ambulatory Visit (INDEPENDENT_AMBULATORY_CARE_PROVIDER_SITE_OTHER): Payer: 59 | Admitting: Orthopaedic Surgery

## 2022-04-20 DIAGNOSIS — M24661 Ankylosis, right knee: Secondary | ICD-10-CM

## 2022-04-20 DIAGNOSIS — Z96651 Presence of right artificial knee joint: Secondary | ICD-10-CM

## 2022-04-20 MED ORDER — GABAPENTIN 300 MG PO CAPS: 300.0000 mg | ORAL_CAPSULE | Freq: Three times a day (TID) | ORAL | 1 refills | Status: DC | PRN

## 2022-04-20 NOTE — Progress Notes (Signed)
The patient is a very pleasant and young 61 year old female who is 6 weeks later this week status post a right total knee arthroplasty.  Her postoperative course has been rough given the fact that she has significant nausea from medications of surgery.  This limited her mobility and getting through therapy.  Notes from therapy stated they have been able to flex her to about 94 degrees.  In the office today I was not even able to flex her to anywhere past 90 degrees. ? ?Presents with her today and explained in detail that we would recommend a manipulation under anesthesia.  We would then continue to have an aggressive physical therapy.  She has taken Neurontin now at night 300 mg we will have her increase that to 2-3 times a day if she is tolerating that.  I also recommended 2 Aleve or 3 Advil twice daily with meals.  We will work on getting her set up for manipulation under anesthesia.  I would then see her back in 2 weeks after that surgery and a repeat AP and lateral of her right knee at that visit.  All questions and concerns were answered and addressed. ?

## 2022-04-21 ENCOUNTER — Telehealth: Payer: Self-pay

## 2022-04-21 ENCOUNTER — Encounter: Payer: Self-pay | Admitting: Orthopaedic Surgery

## 2022-04-21 NOTE — Telephone Encounter (Signed)
Scheduled patient for knee MUA on Thursday, 04/21/22.  Can something be Rx'd for her nerves for day of surgery? ?

## 2022-04-22 ENCOUNTER — Other Ambulatory Visit: Payer: Self-pay | Admitting: Orthopaedic Surgery

## 2022-04-22 MED ORDER — DIAZEPAM 5 MG PO TABS: 5.0000 mg | ORAL_TABLET | Freq: Once | ORAL | 0 refills | Status: AC

## 2022-04-23 ENCOUNTER — Other Ambulatory Visit: Payer: Self-pay | Admitting: Orthopaedic Surgery

## 2022-04-23 DIAGNOSIS — M24661 Ankylosis, right knee: Secondary | ICD-10-CM

## 2022-04-23 MED ORDER — METHOCARBAMOL 500 MG PO TABS
500.0000 mg | ORAL_TABLET | Freq: Four times a day (QID) | ORAL | 1 refills | Status: DC | PRN
Start: 2022-04-23 — End: 2022-05-06

## 2022-05-06 ENCOUNTER — Ambulatory Visit (INDEPENDENT_AMBULATORY_CARE_PROVIDER_SITE_OTHER): Payer: 59 | Admitting: Orthopaedic Surgery

## 2022-05-06 ENCOUNTER — Encounter: Payer: Self-pay | Admitting: Orthopaedic Surgery

## 2022-05-06 DIAGNOSIS — M24661 Ankylosis, right knee: Secondary | ICD-10-CM

## 2022-05-06 DIAGNOSIS — Z96651 Presence of right artificial knee joint: Secondary | ICD-10-CM

## 2022-05-06 MED ORDER — TIZANIDINE HCL 4 MG PO TABS: 4.0000 mg | ORAL_TABLET | Freq: Three times a day (TID) | ORAL | 1 refills | Status: DC | PRN

## 2022-05-06 NOTE — Progress Notes (Signed)
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  We replaced her knee on March 21 and then manipulated it on May 4.  We were able to get her full motion back.  She says therapy has been good for her and her last therapy note showed that she lacks full extension by 3 degrees and flex to 115 degrees.  She had a few days a setback with her mother passing away and needing to take care of the funeral.  She is getting back in therapy again tomorrow.  Her biggest problem is cramps at night and not getting to sleep at night.  She has been on Dilaudid, Neurontin and Robaxin.  I did let her know that I would stop the Robaxin and try Zanaflex instead which may help her more from muscle and cramp standpoint. ? ?On exam today she lacks full extension by few degrees and I can flex her to past 100 degrees. ? ?She will continue to increase her activities as comfort allows.  We will see her back on June 12 which will be right before she is scheduled to return to work.  If we need to extend her further out we will at that point.  At her next visit on June 12 I would like a standing AP and lateral of her right operative knee.  All questions and concerns were answered and addressed. ?

## 2022-05-11 ENCOUNTER — Encounter: Payer: Self-pay | Admitting: Family Medicine

## 2022-05-11 ENCOUNTER — Ambulatory Visit (INDEPENDENT_AMBULATORY_CARE_PROVIDER_SITE_OTHER): Payer: 59 | Admitting: Family Medicine

## 2022-05-11 VITALS — BP 84/60 | HR 79 | Temp 97.8°F | Ht 66.14 in | Wt 163.4 lb

## 2022-05-11 DIAGNOSIS — Z0000 Encounter for general adult medical examination without abnormal findings: Secondary | ICD-10-CM

## 2022-05-11 LAB — CBC WITH DIFFERENTIAL/PLATELET
Basophils Absolute: 0.1 10*3/uL (ref 0.0–0.1)
Basophils Relative: 1.2 % (ref 0.0–3.0)
Eosinophils Absolute: 0.6 10*3/uL (ref 0.0–0.7)
Eosinophils Relative: 6.4 % — ABNORMAL HIGH (ref 0.0–5.0)
HCT: 36 % (ref 36.0–46.0)
Hemoglobin: 12 g/dL (ref 12.0–15.0)
Lymphocytes Relative: 24.6 % (ref 12.0–46.0)
Lymphs Abs: 2.3 10*3/uL (ref 0.7–4.0)
MCHC: 33.3 g/dL (ref 30.0–36.0)
MCV: 91 fl (ref 78.0–100.0)
Monocytes Absolute: 0.6 10*3/uL (ref 0.1–1.0)
Monocytes Relative: 6.8 % (ref 3.0–12.0)
Neutro Abs: 5.8 10*3/uL (ref 1.4–7.7)
Neutrophils Relative %: 61 % (ref 43.0–77.0)
Platelets: 433 10*3/uL — ABNORMAL HIGH (ref 150.0–400.0)
RBC: 3.96 Mil/uL (ref 3.87–5.11)
RDW: 12.8 % (ref 11.5–15.5)
WBC: 9.4 10*3/uL (ref 4.0–10.5)

## 2022-05-11 LAB — BASIC METABOLIC PANEL
BUN: 18 mg/dL (ref 6–23)
CO2: 27 mEq/L (ref 19–32)
Calcium: 9.7 mg/dL (ref 8.4–10.5)
Chloride: 103 mEq/L (ref 96–112)
Creatinine, Ser: 0.6 mg/dL (ref 0.40–1.20)
GFR: 97.28 mL/min (ref >60.00)
Glucose, Bld: 92 mg/dL (ref 70–99)
Potassium: 4.4 mEq/L (ref 3.5–5.1)
Sodium: 138 mEq/L (ref 135–145)

## 2022-05-11 LAB — HEPATIC FUNCTION PANEL
ALT: 15 U/L (ref 0–35)
AST: 15 U/L (ref 0–37)
Albumin: 3.9 g/dL (ref 3.5–5.2)
Alkaline Phosphatase: 80 U/L (ref 39–117)
Bilirubin, Direct: 0.1 mg/dL (ref 0.0–0.3)
Total Bilirubin: 0.4 mg/dL (ref 0.2–1.2)
Total Protein: 7.4 g/dL (ref 6.0–8.3)

## 2022-05-11 LAB — LIPID PANEL
Cholesterol: 163 mg/dL (ref 0–200)
HDL: 43.1 mg/dL (ref >39.00)
LDL Cholesterol: 88 mg/dL (ref 0–99)
NonHDL: 119.53
Total CHOL/HDL Ratio: 4
Triglycerides: 159 mg/dL — ABNORMAL HIGH (ref 0.0–149.0)
VLDL: 31.8 mg/dL (ref 0.0–40.0)

## 2022-05-11 LAB — TSH: TSH: 1.73 u[IU]/mL (ref 0.35–5.50)

## 2022-05-11 MED ORDER — TRAZODONE HCL 50 MG PO TABS: 25.0000 mg | ORAL_TABLET | Freq: Every evening | ORAL | 1 refills | Status: DC | PRN

## 2022-05-11 NOTE — Progress Notes (Signed)
Established Patient Office Visit  Subjective   Patient ID: Laura Wheeler, female    DOB: 04-02-1961  Age: 61 y.o. MRN: [id number]  Chief Complaint  Patient presents with   Annual Exam    HPI   Laura Wheeler is here for physical exam.  She had right total knee replacement out 9 weeks ago and had some slow recovery.  Finally getting some increased range of motion.  Still doing some physical therapy.  Her major complaint is been insomnia for months since her surgery.  Not improved with gabapentin or melatonin.  Is taken some Tylenol PM.  She has had very stressful year.  Her father died about a year ago at 64 of chondrosarcoma.  Mother passed away last week complications of sepsis from pneumonia.  Health maintenance reviewed  -She sees GYN and Pap smear mammogram up-to-date -According to records due for colonoscopy in 2 years -Tetanus due now but she declines today -Has had Shingrix vaccine -Previous hepatitis C screen negative  Family history-mother passed away recently from complications of sepsis.  History of type 2 diabetes, hyperlipidemia, hypothyroidism melanoma.  Father with chondrosarcoma leg past week 96.  Brother with osteoarthritis and she thinks early type 2 diabetes  Social history-she is married.  Has 2 sons.  No grandchildren.  Never smoked.  No regular alcohol.  Psychiatrist projects  Past Medical History:  Diagnosis Date   Arthritis    "knees" (05/19/2013)   Bronchitis    History of cardiac radiofrequency ablation 04/20/2013   Hx of echocardiogram    Echo (12/15):  EF 60-65%, no RWMA, Gr 1 DD   SVT (supraventricular tachycardia) (HCC)    WPW (Wolff-Parkinson-White syndrome) ~ 1983   a. s/p ablation 05/20/13.   Past Surgical History:  Procedure Laterality Date   ANAL FISSURE REPAIR  1980's   BREAST BIOPSY Left 2000's   EXCISION MORTON'S NEUROMA Left 05/04/2013   Procedure: LEFT SECOND MT  WEIL/SECOND WEBB SPACE NEUROMA EXCISION ;  Surgeon:  Wylene Simmer, MD;  Location: [place];  Service: Orthopedics;  Laterality: Left;   FOOT SURGERY Left 09/2014   hardware removal   SUPRAVENTRICULAR TACHYCARDIA ABLATION  05/19/2013   SUPRAVENTRICULAR TACHYCARDIA ABLATION N/A 05/19/2013   Procedure: SUPRAVENTRICULAR TACHYCARDIA ABLATION;  Surgeon: Evans Lance, MD;  Location: [facility] CATH LAB;  Service: Cardiovascular;  Laterality: N/A;   TOTAL KNEE ARTHROPLASTY Right 03/10/2022   Procedure: Right TOTAL KNEE ARTHROPLASTY;  Surgeon: Mcarthur Rossetti, MD;  Location: [place];  Service: Orthopedics;  Laterality: Right;    reports that she has never smoked. She has never used smokeless tobacco. She reports that she does not currently use alcohol. She reports that she does not use drugs. family history includes Arthritis in her brother; Cancer in her father; Depression in her mother; Diabetes in her brother and mother; Hyperlipidemia in her mother; Hypertension in her father; Hypothyroidism in her mother; Melanoma in her mother. Allergies  Allergen Reactions   Dog Epithelium Allergy Skin Test Itching and Shortness Of Breath    Other reaction(s): Cough, Respiratory Distress   Horse Epithelium Shortness Of Breath    Other reaction(s): Cough, Respiratory Distress   Oxycodone Nausea Only   Sulfa Antibiotics Itching and Other (See Comments)    Doesn't remember reaction - 20 years ago    Review of Systems  Constitutional:  Negative for chills, fever, malaise/fatigue and weight loss.  HENT:  Negative for hearing loss.   Eyes:  Negative for blurred vision and  double vision.  Respiratory:  Negative for cough and shortness of breath.   Cardiovascular:  Negative for chest pain, palpitations and leg swelling.  Gastrointestinal:  Negative for abdominal pain, blood in stool, constipation and diarrhea.  Genitourinary:  Negative for dysuria.  Skin:  Negative for rash.  Neurological:  Negative for dizziness, speech change, seizures, loss of consciousness and  headaches.  Psychiatric/Behavioral:  Negative for depression.      Objective:     BP (!) 84/60 (BP Location: Left Arm, Patient Position: Sitting, Cuff Size: Normal)   Pulse 79   Temp 97.8 F (36.6 C) (Oral)   Ht 5' 6.14" (1.68 m)   Wt 163 lb 6.4 oz (74.1 kg)   LMP 09/20/2014   SpO2 96%   BMI 26.26 kg/m    Physical Exam Constitutional:      Appearance: She is well-developed.  HENT:     Head: Normocephalic and atraumatic.  Eyes:     Pupils: Pupils are equal, round, and reactive to light.  Neck:     Thyroid: No thyromegaly.  Cardiovascular:     Rate and Rhythm: Normal rate and regular rhythm.     Heart sounds: Normal heart sounds. No murmur heard. Pulmonary:     Effort: No respiratory distress.     Breath sounds: Normal breath sounds. No wheezing or rales.  Abdominal:     General: Bowel sounds are normal. There is no distension.     Palpations: Abdomen is soft. There is no mass.     Tenderness: There is no abdominal tenderness. There is no guarding or rebound.  Musculoskeletal:        General: Normal range of motion.     Cervical back: Normal range of motion and neck supple.     Comments: Scar right knee from total knee replacement.  Minimal edema.  No warmth.  No erythema.  Lymphadenopathy:     Cervical: No cervical adenopathy.  Skin:    Findings: No rash.  Neurological:     Mental Status: She is alert and oriented to person, place, and time.     Cranial Nerves: No cranial nerve deficit.  Psychiatric:        Behavior: Behavior normal.        Thought Content: Thought content normal.        Judgment: Judgment normal.     No results found for any visits on 05/11/22.  Last CBC Lab Results  Component Value Date   WBC 15.9 (H) 03/12/2022   HGB 11.7 (L) 03/12/2022   HCT 35.6 (L) 03/12/2022   MCV 92.0 03/12/2022   MCH 30.2 03/12/2022   RDW 13.3 03/12/2022   PLT 309 [id number]   Last metabolic panel Lab Results  Component Value Date   GLUCOSE 95 03/06/2022    NA 137 03/06/2022   K 4.2 03/06/2022   CL 102 03/06/2022   CO2 26 03/06/2022   BUN 15 03/06/2022   CREATININE 0.71 03/06/2022   GFRNONAA >60 03/06/2022   CALCIUM 9.6 03/06/2022   PROT 7.3 04/02/2021   ALBUMIN 4.1 04/02/2021   BILITOT 0.7 04/02/2021   ALKPHOS 72 04/02/2021   AST 18 04/02/2021   ALT 17 04/02/2021   ANIONGAP 9 03/06/2022   Last lipids Lab Results  Component Value Date   CHOL 141 04/02/2021   HDL 43.40 04/02/2021   LDLCALC 69 04/02/2021   TRIG 143.0 04/02/2021   CHOLHDL 3 04/02/2021   Last hemoglobin A1c Lab Results  Component Value Date  HGBA1C 5.5 03/02/2019   Last thyroid functions Lab Results  Component Value Date   TSH 1.28 04/02/2021   Last vitamin D No results found for: 25OHVITD2, 25OHVITD3, VD25OH    The ASCVD Risk score (Arnett DK, et al., 2019) failed to calculate for the following reasons:   The valid systolic blood pressure range is 90 to 200 mmHg    Assessment & Plan:   Problem List Items Addressed This Visit   None Visit Diagnoses     Physical exam    -  Primary   Relevant Orders   Basic metabolic panel   Lipid panel   CBC with Differential/Platelet   TSH   Hepatic function panel     We discussed the following issues  -Needs tetanus but she declines today.  She will schedule this for later -She will continue with annual GYN follow-up and is getting mammograms and Pap smears through them -We will need repeat colonoscopy within the next couple years -She did have questions regarding Pneumovax as her mom recently died of sepsis complications or pneumonia.  Bailey Mech is low risk but would certainly need Prevnar 20 by age 85. -Obtain screening labs as above  No follow-ups on file.    Carolann Littler, MD

## 2022-05-19 ENCOUNTER — Telehealth: Payer: Self-pay | Admitting: Orthopaedic Surgery

## 2022-05-19 NOTE — Telephone Encounter (Signed)
[employer] form received. To [employer]iox.

## 2022-06-01 ENCOUNTER — Encounter: Payer: Self-pay | Admitting: Orthopaedic Surgery

## 2022-06-01 ENCOUNTER — Ambulatory Visit (INDEPENDENT_AMBULATORY_CARE_PROVIDER_SITE_OTHER): Payer: 59 | Admitting: Orthopaedic Surgery

## 2022-06-01 ENCOUNTER — Ambulatory Visit (INDEPENDENT_AMBULATORY_CARE_PROVIDER_SITE_OTHER): Payer: 59

## 2022-06-01 DIAGNOSIS — Z96651 Presence of right artificial knee joint: Secondary | ICD-10-CM

## 2022-06-01 MED ORDER — TRAMADOL HCL 50 MG PO TABS: 100.0000 mg | ORAL_TABLET | Freq: Four times a day (QID) | ORAL | 0 refills | Status: DC | PRN

## 2022-06-01 NOTE — Progress Notes (Signed)
The patient is now 3 months status post a right total knee arthroplasty.  She is making great progress at this point after manipulation under anesthesia.  On my exam today she lacks full extension by few degrees but her flexion is full.  The knee feels ligamentously stable.  2 views of the right knee show well-seated total knee arthroplasty with no complicating features.  She is making excellent progress at this standpoint.  I will send in some tramadol for pain.  The maximum to see her back is not for 3 months.  No x-rays are needed at that visit.  I will allow her to return to work with some restrictions and I will put that in a note for her.  She can return tomorrow.  All questions and concerns were answered and addressed.

## 2022-06-02 ENCOUNTER — Other Ambulatory Visit: Payer: Self-pay | Admitting: Family Medicine

## 2022-06-24 ENCOUNTER — Encounter: Payer: Self-pay | Admitting: Orthopaedic Surgery

## 2022-06-27 ENCOUNTER — Other Ambulatory Visit: Payer: Self-pay | Admitting: Orthopaedic Surgery

## 2022-07-14 ENCOUNTER — Other Ambulatory Visit: Payer: Self-pay | Admitting: Orthopaedic Surgery

## 2022-07-22 ENCOUNTER — Ambulatory Visit
Admission: RE | Admit: 2022-07-22 | Discharge: 2022-07-22 | Disposition: A | Discharge status: Home / Self Care | Payer: 59 | Source: Ambulatory Visit | Attending: Allergy and Immunology | Admitting: Allergy and Immunology

## 2022-07-22 ENCOUNTER — Other Ambulatory Visit: Payer: Self-pay | Admitting: Allergy and Immunology

## 2022-07-22 DIAGNOSIS — J4531 Mild persistent asthma with (acute) exacerbation: Secondary | ICD-10-CM

## 2022-08-13 ENCOUNTER — Encounter: Payer: Self-pay | Admitting: Family Medicine

## 2022-08-14 MED ORDER — SCOPOLAMINE 1 MG/3DAYS TD PT72: 1.0000 | MEDICATED_PATCH | TRANSDERMAL | 0 refills | Status: DC

## 2022-08-14 NOTE — Telephone Encounter (Signed)
I sent in some scopolamine patches.    They need to start this several hours prior to getting on the cruise ship.

## 2022-08-31 ENCOUNTER — Other Ambulatory Visit: Payer: Self-pay | Admitting: Orthopaedic Surgery

## 2022-08-31 ENCOUNTER — Ambulatory Visit (INDEPENDENT_AMBULATORY_CARE_PROVIDER_SITE_OTHER): Payer: 59 | Admitting: Orthopaedic Surgery

## 2022-08-31 ENCOUNTER — Encounter: Payer: Self-pay | Admitting: Orthopaedic Surgery

## 2022-08-31 DIAGNOSIS — Z96651 Presence of right artificial knee joint: Secondary | ICD-10-CM | POA: Diagnosis not present

## 2022-08-31 DIAGNOSIS — M1712 Unilateral primary osteoarthritis, left knee: Secondary | ICD-10-CM

## 2022-08-31 DIAGNOSIS — G8929 Other chronic pain: Secondary | ICD-10-CM

## 2022-08-31 DIAGNOSIS — M25562 Pain in left knee: Secondary | ICD-10-CM

## 2022-08-31 MED ORDER — TIZANIDINE HCL 4 MG PO TABS: 4.0000 mg | ORAL_TABLET | Freq: Three times a day (TID) | ORAL | 1 refills | Status: DC | PRN

## 2022-08-31 MED ORDER — TRAMADOL HCL 50 MG PO TABS: 100.0000 mg | ORAL_TABLET | Freq: Four times a day (QID) | ORAL | 0 refills | Status: DC | PRN

## 2022-08-31 NOTE — Progress Notes (Signed)
The patient is now 5 and half months status post a right total knee arthroplasty.  Her postoperative course was slowed down by needing an manipulation under anesthesia.  Since then she is doing great.  She has known osteoarthritis of the left knee.  She last had an injection in that left knee way back in January.  Her right operative knee lacks full extension by about 30 degrees but her flexion is full.  The knee feels ligamentously stable.  The left knee has global tenderness.  We will order hyaluronic acid injection again for the left knee since has been well over 8 months.  She will continue to work on right knee strengthening and range of motion.  She is in Pilates now which will be helpful for her.  We will see her back at her next follow-up to hopefully place hyaluronic acid in the left knee to treat the pain from osteoarthritis once that is ordered and approved.

## 2022-09-01 ENCOUNTER — Other Ambulatory Visit: Payer: Self-pay

## 2022-09-01 ENCOUNTER — Telehealth: Payer: Self-pay

## 2022-09-01 NOTE — Telephone Encounter (Signed)
Left knee gel injection ?

## 2022-09-03 NOTE — Telephone Encounter (Signed)
VOB submitted for Durolane, left knee.  

## 2022-09-07 ENCOUNTER — Encounter: Payer: Self-pay | Admitting: Family Medicine

## 2022-09-17 ENCOUNTER — Encounter: Payer: Self-pay | Admitting: Orthopaedic Surgery

## 2022-09-17 NOTE — Telephone Encounter (Signed)
Talked with patient and appointment has been scheduled.  

## 2022-09-18 ENCOUNTER — Other Ambulatory Visit: Payer: Self-pay

## 2022-09-18 DIAGNOSIS — M1712 Unilateral primary osteoarthritis, left knee: Secondary | ICD-10-CM

## 2022-09-28 ENCOUNTER — Telehealth: Payer: Self-pay | Admitting: Family Medicine

## 2022-09-28 NOTE — Telephone Encounter (Signed)
Left detailed message on vm informing patient of the message below  

## 2022-09-28 NOTE — Telephone Encounter (Signed)
Pt requesting pneumonia vaccine, was advised to get one by her allergy doc since she has asthma. Also wants flu shot

## 2022-10-07 ENCOUNTER — Ambulatory Visit (INDEPENDENT_AMBULATORY_CARE_PROVIDER_SITE_OTHER): Payer: 59 | Admitting: Orthopaedic Surgery

## 2022-10-07 ENCOUNTER — Encounter: Payer: Self-pay | Admitting: Orthopaedic Surgery

## 2022-10-07 DIAGNOSIS — M1712 Unilateral primary osteoarthritis, left knee: Secondary | ICD-10-CM

## 2022-10-07 MED ORDER — SODIUM HYALURONATE 60 MG/3ML IX PRSY
60.0000 mg | PREFILLED_SYRINGE | INTRA_ARTICULAR | Status: AC | PRN
  Administered 2022-10-07: 60 mg via INTRA_ARTICULAR

## 2022-10-07 MED ORDER — LIDOCAINE HCL 1 % IJ SOLN
3.0000 mL | INTRAMUSCULAR | Status: AC | PRN
  Administered 2022-10-07: 3 mL

## 2022-10-07 NOTE — Progress Notes (Signed)
   Procedure Note  Patient: Laura Wheeler             Date of Birth: 26-May-1961           MRN: [id number]             Visit Date: 10/07/2022   HPI: Laura Wheeler comes in today for scheduled left knee Durolane injection.  She has known osteoarthritis left knee.  No scheduled surgery for the left knee in the next 6 months.  She is status post right total knee arthroplasty now 7 months postop.  She states she is having some discomfort at the lateral aspect of the anterior tib-fib region and in the posterior hamstring area of the right knee.  She has had no known injury to the right knee.  She continues to work on range of motion strengthening the knee.  Review of systems: See HPI otherwise negative  Physical exam: Left knee full extension full flexion.  No instability valgus varus stressing no abnormal warmth erythema.  Right knee lacks full extension by couple degrees only.  Has full flexion.  No instability valgus varus stressing.  Compartments are soft throughout.  Quads still slightly atrophied on the right compared to left.  Surgical incisions well-healed.  Procedures: Visit Diagnoses:  1. Primary osteoarthritis of left knee     Large Joint Inj: L knee on 10/07/2022 8:45 AM Indications: pain Details: 22 G 1.5 in needle, anterolateral approach  Arthrogram: No  Medications: 3 mL lidocaine 1 %; 60 mg Sodium Hyaluronate 60 MG/3ML Outcome: tolerated well, no immediate complications Procedure, treatment alternatives, risks and benefits explained, specific risks discussed. Consent was given by the patient. Immediately prior to procedure a time out was called to verify the correct patient, procedure, equipment, support staff and site/side marked as required. Patient was prepped and draped in the usual sterile fashion.     Plan: We will see her back in 1 year postop from the right total knee arthroplasty that time we will obtain AP and lateral views of the right knee.  She can follow-up  with [place] as needed in regards to the left knee.  He understands to wait 6 months between supplemental injections.  Questions were encouraged and answered at length.  She is to apply Voltaren gel over the anterior aspect of the right knee up to 4 g 4 times daily.

## 2022-10-09 ENCOUNTER — Ambulatory Visit: Payer: 59

## 2022-10-09 ENCOUNTER — Ambulatory Visit (INDEPENDENT_AMBULATORY_CARE_PROVIDER_SITE_OTHER): Payer: 59 | Admitting: Family Medicine

## 2022-10-09 ENCOUNTER — Encounter: Payer: Self-pay | Admitting: Family Medicine

## 2022-10-09 VITALS — BP 100/60 | HR 105 | Temp 98.0°F | Ht 66.14 in | Wt 163.8 lb

## 2022-10-09 DIAGNOSIS — R053 Chronic cough: Secondary | ICD-10-CM | POA: Diagnosis not present

## 2022-10-09 MED ORDER — PANTOPRAZOLE SODIUM 40 MG PO TBEC: 40.0000 mg | DELAYED_RELEASE_TABLET | Freq: Every day | ORAL | 3 refills | Status: DC

## 2022-10-09 NOTE — Progress Notes (Signed)
Established Patient Office Visit  Subjective   Patient ID: ABY GESSEL, female    DOB: November 14, 1961  Age: 61 y.o. MRN: [id number]  Chief Complaint  Patient presents with   Cough    Patient complains of cough, x2 months, Productive cough with clear sputum   Fatigue    Patient complains of fatigue, x2 months   Wheezing    Patient complains of wheezing, x1 month     HPI   Laura Wheeler is seen with intermittent cough really for over 2 months duration.  She has history of seasonal and perennial allergies and is followed by allergist.  Currently takes Xyzal daily along with combination nasal antihistamine and nasal steroid.  She has never smoked.  She has been on a couple courses of oral prednisone each time her cough seemed to improve but only briefly.  She takes Symbicort but apparently only taking usually once daily instead of at night because of some palpitation issues.  Her cough is sometimes productive of clear sputum and sometimes dry.  She does have some nonspecific fatigue.  She feels like she is wheezing almost daily.  She had chest x-ray back in August which showed no acute findings.  She had no fever.  No hemoptysis.  No formal diagnosis of asthma.  No ACE inhibitor use.  No obvious GERD though she does clear her throat frequently.  May still have some postnasal drip though she is on allergy medications as above.  Milani states her mom died of sepsis from pneumonia complications and that is part of her concern.  She is also concerned because her cough seems to be worse with activity such as exercise which has limited her exercise since her right total knee replacement.  Past Medical History:  Diagnosis Date   Arthritis    "knees" (05/19/2013)   Bronchitis    History of cardiac radiofrequency ablation 04/20/2013   Hx of echocardiogram    Echo (12/15):  EF 60-65%, no RWMA, Gr 1 DD   SVT (supraventricular tachycardia)    WPW (Wolff-Parkinson-White syndrome) ~ 1983   a. s/p ablation  05/20/13.   Past Surgical History:  Procedure Laterality Date   ANAL FISSURE REPAIR  1980's   BREAST BIOPSY Left 2000's   EXCISION MORTON'S NEUROMA Left 05/04/2013   Procedure: LEFT SECOND MT  WEIL/SECOND WEBB SPACE NEUROMA EXCISION ;  Surgeon: [name], MD;  Location: [facility] OR;  Service: Orthopedics;  Laterality: Left;   FOOT SURGERY Left 09/2014   hardware removal   SUPRAVENTRICULAR TACHYCARDIA ABLATION  05/19/2013   SUPRAVENTRICULAR TACHYCARDIA ABLATION N/A 05/19/2013   Procedure: SUPRAVENTRICULAR TACHYCARDIA ABLATION;  Surgeon: Marinus Maw, MD;  Location: [facility] CATH LAB;  Service: Cardiovascular;  Laterality: N/A;   TOTAL KNEE ARTHROPLASTY Right 03/10/2022   Procedure: Right TOTAL KNEE ARTHROPLASTY;  Surgeon: Kathryne Hitch, MD;  Location: [facility] OR;  Service: Orthopedics;  Laterality: Right;    reports that she has never smoked. She has never used smokeless tobacco. She reports that she does not currently use alcohol. She reports that she does not use drugs. family history includes Arthritis in her brother; Cancer in her father; Depression in her mother; Diabetes in her brother and mother; Hyperlipidemia in her mother; Hypertension in her father; Hypothyroidism in her mother; Melanoma in her mother. Allergies  Allergen Reactions   Dog Epithelium Allergy Skin Test Itching and Shortness Of Breath    Other reaction(s): Cough, Respiratory Distress   Horse Epithelium Shortness Of Breath  Other reaction(s): Cough, Respiratory Distress   Oxycodone Nausea Only   Sulfa Antibiotics Itching and Other (See Comments)    Doesn't remember reaction - 20 years ago    Review of Systems  Constitutional:  Negative for chills, fever and weight loss.  HENT:  Negative for ear pain, sinus pain and sore throat.   Respiratory:  Positive for cough and wheezing. Negative for hemoptysis.   Cardiovascular:  Negative for chest pain, leg swelling and PND.      Objective:     BP 100/60 (BP Location:  Left Arm, Patient Position: Sitting, Cuff Size: Normal)   Pulse (!) 105   Temp 98 F (36.7 C) (Oral)   Ht 5' 6.14" (1.68 m)   Wt 163 lb 12.8 oz (74.3 kg)   LMP 09/20/2014   SpO2 97%   BMI 26.33 kg/m  BP Readings from Last 3 Encounters:  10/09/22 100/60  05/11/22 (!) 84/60  03/12/22 113/62   Wt Readings from Last 3 Encounters:  10/09/22 163 lb 12.8 oz (74.3 kg)  05/11/22 163 lb 6.4 oz (74.1 kg)  03/10/22 165 lb (74.8 kg)      Physical Exam Vitals reviewed.  Constitutional:      General: She is not in acute distress.    Appearance: Normal appearance. She is not ill-appearing.  HENT:     Mouth/Throat:     Mouth: Mucous membranes are moist.     Pharynx: Oropharynx is clear.  Cardiovascular:     Rate and Rhythm: Normal rate and regular rhythm.  Pulmonary:     Effort: Pulmonary effort is normal.     Breath sounds: Normal breath sounds. No wheezing or rales.  Musculoskeletal:     Cervical back: Neck supple.  Lymphadenopathy:     Cervical: No cervical adenopathy.  Neurological:     Mental Status: She is alert.      No results found for any visits on 10/09/22.    The 10-year ASCVD risk score (Arnett DK, et al., 2019) is: 2.3%    Assessment & Plan:   Patient presents with several month history of intermittently productive cough.  She has history of chronic allergies and is on several allergy medications.  Differential is upper airway cough syndrome, postnasal drip related, silent GERD related.  No ACE inhibitor use.  Patient has responded a couple times to oral steroids but wishes to avoid further steroids if possible.  She has used Symbicort but has not seen much improvement in her wheezing. She has never smoked.  Recent chest x-ray as above unremarkable.  We suggested the following  -They already have head of bed elevated and avoid eating within 3 hours of bedtime -Consider trial of over-the-counter chlorpheniramine 4 mg nightly -Consider trial of Protonix 40 mg  once daily -Continue her usual allergy medications as well as Symbicort -Set up pulmonary referral for further evaluation  No follow-ups on file.    Carolann Littler, MD

## 2022-10-09 NOTE — Patient Instructions (Signed)
Consider trial of over the counter chlorpheniramine 4 mg at night for postnasal drip  I will be setting up pulmonary referral.

## 2022-10-12 ENCOUNTER — Encounter: Payer: Self-pay | Admitting: Family Medicine

## 2022-10-12 MED ORDER — AZITHROMYCIN 250 MG PO TABS: ORAL_TABLET | ORAL | 0 refills | Status: AC

## 2022-10-22 ENCOUNTER — Telehealth: Payer: Self-pay | Admitting: Family Medicine

## 2022-10-22 NOTE — Telephone Encounter (Signed)
Pt sent a mychart requesting for the following:   "Influenza Vaccine. I'd also like to get my pneumonia shot and RSV is Dr Elease Hashimoto thinks I need it due to my asthma and allergies.  Thanks,  Sybol"  Sending an encounter back to get a order placed for her pneumonia shot and for it to be scheduled on 11/02/2022 Monday Afternoon.  Please advise.

## 2022-10-28 ENCOUNTER — Institutional Professional Consult (permissible substitution): Payer: 59 | Admitting: Pulmonary Disease

## 2022-11-02 ENCOUNTER — Encounter: Payer: Self-pay | Admitting: Family Medicine

## 2022-11-02 ENCOUNTER — Ambulatory Visit (INDEPENDENT_AMBULATORY_CARE_PROVIDER_SITE_OTHER): Payer: 59

## 2022-11-02 DIAGNOSIS — Z23 Encounter for immunization: Secondary | ICD-10-CM | POA: Diagnosis not present

## 2022-11-04 ENCOUNTER — Telehealth: Payer: Self-pay | Admitting: Pulmonary Disease

## 2022-11-04 ENCOUNTER — Encounter: Payer: Self-pay | Admitting: Pulmonary Disease

## 2022-11-04 ENCOUNTER — Ambulatory Visit (INDEPENDENT_AMBULATORY_CARE_PROVIDER_SITE_OTHER): Payer: 59 | Admitting: Pulmonary Disease

## 2022-11-04 VITALS — BP 106/60 | HR 100 | Temp 98.7°F | Ht 66.0 in | Wt 166.0 lb

## 2022-11-04 DIAGNOSIS — R059 Cough, unspecified: Secondary | ICD-10-CM | POA: Diagnosis not present

## 2022-11-04 LAB — POCT EXHALED NITRIC OXIDE: FeNO level (ppb): 169

## 2022-11-04 MED ORDER — BUDESONIDE-FORMOTEROL FUMARATE 160-4.5 MCG/ACT IN AERO: 2.0000 | INHALATION_SPRAY | Freq: Two times a day (BID) | RESPIRATORY_TRACT | 5 refills | Status: DC

## 2022-11-04 NOTE — Telephone Encounter (Signed)
Called and spoke with patient. She verbalized understanding.   Nothing further needed.  

## 2022-11-04 NOTE — Patient Instructions (Signed)
CBC with differentials  Feno testing today  PFT at next visit  Schedule for follow-up in 5 to 6 weeks  Continue using current medications for allergies  You will be started coughing and wheezing again, start back on Symbicort

## 2022-11-04 NOTE — Telephone Encounter (Signed)
Inform patient that a prescription for Symbicort 160 was sent to pharmacy  She was seen in the office today and had a phenol study performed showing significant elevation at 169  This is consistent with significant inflammation in the airway  Symbicort 162 puffs twice a day sent to pharmacy She should make sure she rinses after use  Call with significant concerns  She can continue using this until the next time she is seen in the office

## 2022-11-04 NOTE — Progress Notes (Signed)
Laura Wheeler    [id number]    [birth year]  Primary Care Physician:Burchette, Elberta Fortis, MD  Referring Physician: Kristian Covey, MD [address] [place],  [place] [address]  Chief complaint:   Cough, wheezing, shortness of breath Recurrent bronchitis  HPI:  Patient with multiple bouts of bronchitis In the last year has used 3 different courses of antibiotics and steroids  Most recently course of antibiotics for cough and wheezing that lasted many weeks in October  She is feeling better at the present time  Known to have multiple allergens including dogs, horses, sycamore tree, roaches  Started allergy shots beginning of the year 2023  Has a past history of asthma, allergy, sinus problems  Relays concerns about mom having similar symptoms and passed from pneumonia/sepsis  Recently started on Protonix for possible reflux- empirically   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   [facility] ASPIRIN ADULT LOW DOSE 81 MG chewable tablet CHEW 1 TABLET (81 MG TOTAL) BY MOUTH 2 (TWO) TIMES DAILY.   EPINEPHrine 0.3 mg/0.3 mL IJ SOAJ injection Inject 0.3 mg into the muscle as needed for anaphylaxis.   gabapentin (NEURONTIN) 300 MG capsule TAKE 1 CAPSULE BY MOUTH EVERYDAY AT BEDTIME   levocetirizine (XYZAL) 5 MG tablet Take 5 mg by mouth at bedtime.   Multiple Vitamin (MULTIVITAMINS PO) Take 1 Package by mouth daily. Shaklee  Vita pack   OVER THE COUNTER MEDICATION Take 2 tablets by mouth daily. [place]lee  [facility]   OVER THE COUNTER MEDICATION Take 1 tablet by mouth daily as needed (bronchitis). Shaklee   supplement for vitamin C   OVER THE COUNTER MEDICATION Take 5 tablets by mouth daily. Shaklee Osteomatrix   pantoprazole (PROTONIX) 40 MG tablet Take 1 tablet (40 mg total) by mouth daily.    Protein POWD Take 2 Scoops by mouth daily. Shaklee life shake Add collagen   SYMBICORT 80-4.5 MCG/ACT inhaler Inhale 2 puffs into the lungs daily as needed (bronchitis).   tiZANidine (ZANAFLEX) 4 MG tablet Take 1 tablet (4 mg total) by mouth every 8 (eight) hours as needed for muscle spasms.   traMADol (ULTRAM) 50 MG tablet Take 2 tablets (100 mg total) by mouth every 6 (six) hours as needed.   traZODone (DESYREL) 50 MG tablet TAKE 0.5-1 TABLETS BY MOUTH AT BEDTIME AS NEEDED FOR SLEEP.   TURMERIC PO Take 1 tablet by mouth daily. Shaklee   valACYclovir (VALTREX) 500 MG tablet Take 500 mg by mouth daily as needed (Fever blister).   No facility-administered encounter medications on file as of 11/04/2022.    Allergies as of 11/04/2022 - Review Complete 11/04/2022  Allergen Reaction Noted   Dog epithelium allergy skin test Itching and Shortness Of Breath [birth year]   Horse epithelium Shortness Of Breath Oct 28, 1961   Oxycodone Nausea Only 05/11/2022   Sulfa antibiotics Itching and Other (See Comments) 05/01/2013    Past Medical History:  Diagnosis Date   Arthritis    "knees" (05/19/2013)   Bronchitis    History of cardiac radiofrequency ablation 04/20/2013   Hx of echocardiogram    Echo (12/15):  EF 60-65%, no RWMA, Gr 1 DD   SVT (supraventricular tachycardia)    WPW (Wolff-Parkinson-White syndrome) ~ 1983   a. s/p ablation 05/20/13.    Past Surgical History:  Procedure Laterality Date   ANAL FISSURE REPAIR  1980's   BREAST BIOPSY Left 2000's   EXCISION MORTON'S NEUROMA Left 05/04/2013   Procedure: LEFT SECOND MT  WEIL/SECOND WEBB SPACE NEUROMA EXCISION ;  Surgeon: Wylene Simmer, MD;  Location: [place];  Service: Orthopedics;  Laterality: Left;   FOOT SURGERY Left 09/2014   hardware removal   SUPRAVENTRICULAR TACHYCARDIA ABLATION  05/19/2013   SUPRAVENTRICULAR TACHYCARDIA ABLATION N/A 05/19/2013   Procedure: SUPRAVENTRICULAR TACHYCARDIA ABLATION;  Surgeon: Evans Lance, MD;  Location:  [facility] CATH LAB;  Service: Cardiovascular;  Laterality: N/A;   TOTAL KNEE ARTHROPLASTY Right 03/10/2022   Procedure: Right TOTAL KNEE ARTHROPLASTY;  Surgeon: Mcarthur Rossetti, MD;  Location: [place];  Service: Orthopedics;  Laterality: Right;    Family History  Problem Relation Age of Onset   Diabetes Mother    Depression Mother    Melanoma Mother    Hypothyroidism Mother    Hyperlipidemia Mother    Cancer Father        ?osteosarcoma leg   Hypertension Father    Diabetes Brother    Arthritis Brother    Heart attack Neg Hx     Social History   Socioeconomic History   Marital status: Married    Spouse name: Elta [place]   Number of children: 2   Years of education: BS   Highest education level: Bachelor's degree (e.g., BA, AB, BS)  Occupational History    Employer: [employer]    Comment: [place]  Tobacco Use   Smoking status: Never   Smokeless tobacco: Never  Vaping Use   Vaping Use: Never used  Substance and Sexual Activity   Alcohol use: Not Currently    Comment: 1-2 glasses occassionaly   Drug use: No   Sexual activity: Yes    Birth control/protection: Inserts  Other Topics Concern   Not on file  Social History Narrative   Patient is married and lives at home with her husband.     New job in 2017, Publishing rights manager   No regular exercise - diet is ok.   Social Determinants of Health   Financial Resource Strain: Low Risk  (10/23/2021)   Overall Financial Resource Strain (CARDIA)    Difficulty of Paying Living Expenses: Not hard at all  Food Insecurity: No Food Insecurity (10/23/2021)   Hunger Vital Sign    Worried About Running Out of Food in the Last Year: Never true    Ran Out of Food in the Last Year: Never true  Transportation Needs: No Transportation Needs (10/23/2021)   PRAPARE - Hydrologist (Medical): No    Lack of Transportation (Non-Medical): No  Physical Activity: Sufficiently Active (10/23/2021)   Exercise Vital  Sign    Days of Exercise per Week: 5 days    Minutes of Exercise per Session: 50 min  Stress: No Stress Concern Present (10/23/2021)   [place]    Feeling of Stress : Only a little  Social Connections: Socially Integrated (10/23/2021)   Social Connection and Isolation Panel [NHANES]    Frequency of Communication with Friends and Family: More than three times a week    Frequency of Social Gatherings with Friends and Family: Three times a week    Attends Religious Services: More than 4 times per year    Active Member of Clubs or Organizations: Yes    Attends Archivist Meetings: More than 4 times  per year    Marital Status: Married  Human resources officer Violence: Not on file    Review of Systems  Respiratory:  Positive for cough, shortness of breath and wheezing.     Vitals:   11/04/22 1122  BP: 106/60  Pulse: 100  Temp: 98.7 F (37.1 C)  SpO2: 99%     Physical Exam Constitutional:      Appearance: Normal appearance.  HENT:     Head: Normocephalic.     Mouth/Throat:     Mouth: Mucous membranes are moist.  Eyes:     Pupils: Pupils are equal, round, and reactive to light.  Cardiovascular:     Rate and Rhythm: Normal rate and regular rhythm.     Heart sounds: No murmur heard.    No friction rub.  Pulmonary:     Effort: No respiratory distress.     Breath sounds: No stridor. No wheezing or rhonchi.  Musculoskeletal:     Cervical back: No rigidity or tenderness.  Neurological:     Mental Status: She is alert.  Psychiatric:        Mood and Affect: Mood normal.    Data Reviewed: Recent chest x-ray reviewed  Assessment:  Recurrent bronchitis  Multiple allergies  She is feeling relatively well at present but this is following a recent exacerbation requiring courses of steroids and antibiotics  Concern for eosinophilic asthma -Previous CBC with differential shows eosinophilia of 600, has  fluctuated over time -She has not been on steroid inhalers long-term, has used them with exacerbations and they have helped  Possible cough variant asthma  Plan/Recommendations: Schedule for pulmonary function test  Obtain CBC with differentials  Obtain Feno testing today  Avoid known triggers  Continue current medications including Xyzal, Astelin Protonix for possible GERD  Follow-up in 5 to 6 weeks  Encouraged to call with significant concerns  FeNo today was 169 -This is consistent with significant inflammation and airway -She does have blood work pending and also a PFT at next visit  Will place a order for her to start Symbicort 160   Sherrilyn Rist MD [facility] 11/04/2022, 11:58 AM  CC: Eulas Post, MD

## 2022-11-05 ENCOUNTER — Other Ambulatory Visit (INDEPENDENT_AMBULATORY_CARE_PROVIDER_SITE_OTHER): Payer: 59

## 2022-11-05 DIAGNOSIS — R059 Cough, unspecified: Secondary | ICD-10-CM

## 2022-11-05 LAB — CBC WITH DIFFERENTIAL/PLATELET
Basophils Absolute: 0.1 10*3/uL (ref 0.0–0.1)
Basophils Relative: 0.9 % (ref 0.0–3.0)
Eosinophils Absolute: 0.7 10*3/uL (ref 0.0–0.7)
Eosinophils Relative: 6.7 % — ABNORMAL HIGH (ref 0.0–5.0)
HCT: 37 % (ref 36.0–46.0)
Hemoglobin: 12.5 g/dL (ref 12.0–15.0)
Lymphocytes Relative: 29.7 % (ref 12.0–46.0)
Lymphs Abs: 3.2 10*3/uL (ref 0.7–4.0)
MCHC: 33.9 g/dL (ref 30.0–36.0)
MCV: 91.6 fl (ref 78.0–100.0)
Monocytes Absolute: 1 10*3/uL (ref 0.1–1.0)
Monocytes Relative: 9.4 % (ref 3.0–12.0)
Neutro Abs: 5.7 10*3/uL (ref 1.4–7.7)
Neutrophils Relative %: 53.3 % (ref 43.0–77.0)
Platelets: 362 10*3/uL (ref 150.0–400.0)
RBC: 4.04 Mil/uL (ref 3.87–5.11)
RDW: 12.7 % (ref 11.5–15.5)
WBC: 10.7 10*3/uL — ABNORMAL HIGH (ref 4.0–10.5)

## 2022-11-06 ENCOUNTER — Encounter: Payer: Self-pay | Admitting: Pulmonary Disease

## 2022-11-09 NOTE — Telephone Encounter (Signed)
Dr. Wynona Neat, pt is requesting results of her CBC. Thanks.

## 2022-11-10 NOTE — Telephone Encounter (Signed)
White count is mildly elevated -No changes to care plan needed -It is not an absolute number so does fluctuate -It can be higher with stress, it can be higher with steroid use, can be higher with infections  Number of eosinophils is high -It is a marker of eosinophilic asthma  -Treatment is still with inhalers to try and reduce the inflammation in the airway  -We do not need to change the timing of the PFT

## 2022-11-21 ENCOUNTER — Other Ambulatory Visit: Payer: Self-pay | Admitting: Family Medicine

## 2022-12-09 ENCOUNTER — Ambulatory Visit (INDEPENDENT_AMBULATORY_CARE_PROVIDER_SITE_OTHER): Payer: 59 | Admitting: Pulmonary Disease

## 2022-12-09 DIAGNOSIS — R059 Cough, unspecified: Secondary | ICD-10-CM | POA: Diagnosis not present

## 2022-12-09 LAB — PULMONARY FUNCTION TEST
DL/VA % pred: 124 %
DL/VA: 5.14 ml/min/mmHg/L
DLCO cor % pred: 108 %
DLCO cor: 23.28 ml/min/mmHg
DLCO unc % pred: 104 %
DLCO unc: 22.61 ml/min/mmHg
FEF 25-75 Post: 2.48 L/sec
FEF 25-75 Pre: 1.88 L/sec
FEF2575-%Change-Post: 31 %
FEF2575-%Pred-Post: 102 %
FEF2575-%Pred-Pre: 77 %
FEV1-%Change-Post: 10 %
FEV1-%Pred-Post: 90 %
FEV1-%Pred-Pre: 82 %
FEV1-Post: 2.46 L
FEV1-Pre: 2.23 L
FEV1FVC-%Change-Post: 3 %
FEV1FVC-%Pred-Pre: 98 %
FEV6-%Change-Post: 5 %
FEV6-%Pred-Post: 90 %
FEV6-%Pred-Pre: 85 %
FEV6-Post: 3.08 L
FEV6-Pre: 2.9 L
FEV6FVC-%Pred-Post: 103 %
FEV6FVC-%Pred-Pre: 103 %
FVC-%Change-Post: 5 %
FVC-%Pred-Post: 87 %
FVC-%Pred-Pre: 82 %
FVC-Post: 3.08 L
FVC-Pre: 2.9 L
Post FEV1/FVC ratio: 80 %
Post FEV6/FVC ratio: 100 %
Pre FEV1/FVC ratio: 77 %
Pre FEV6/FVC Ratio: 100 %
RV % pred: 64 %
RV: 1.37 L
TLC % pred: 84 %
TLC: 4.54 L

## 2022-12-09 NOTE — Progress Notes (Signed)
Full PFT performed today. °

## 2022-12-09 NOTE — Patient Instructions (Signed)
Full PFT performed today. °

## 2022-12-16 ENCOUNTER — Other Ambulatory Visit: Payer: Self-pay | Admitting: Pulmonary Disease

## 2022-12-16 ENCOUNTER — Ambulatory Visit (INDEPENDENT_AMBULATORY_CARE_PROVIDER_SITE_OTHER): Payer: 59 | Admitting: Pulmonary Disease

## 2022-12-16 ENCOUNTER — Encounter: Payer: Self-pay | Admitting: Pulmonary Disease

## 2022-12-16 VITALS — BP 98/60 | HR 90 | Temp 98.4°F | Ht 65.0 in | Wt 169.2 lb

## 2022-12-16 DIAGNOSIS — R0982 Postnasal drip: Secondary | ICD-10-CM

## 2022-12-16 MED ORDER — MONTELUKAST SODIUM 10 MG PO TABS: 10.0000 mg | ORAL_TABLET | Freq: Every day | ORAL | 3 refills | Status: DC

## 2022-12-16 NOTE — Patient Instructions (Signed)
Referral to ENT  Prescription for Singulair sent to pharmacy for you to be used 1 pill daily  Continue Symbicort for now  If you get to a point where you feel you may want to stop Symbicort, stop it for about a week or 2 to see if your symptoms remain exactly the same, if there is worsening symptoms-get back to using the Symbicort  I will see you back in about 3 months  Your breathing study is within normal limits, there is evidence of improvement in your airways with inhalers

## 2022-12-16 NOTE — Progress Notes (Signed)
Laura Wheeler    [id number]    03/25/1961  Primary Care Physician:Burchette, Elberta Fortis, MD  Referring Physician: Kristian Covey, MD [address] [place],  [place] [address]  Chief complaint:   Cough, wheezing, shortness of breath Recurrent bronchitis  HPI:  Patient with multiple bouts of bronchitis In the last year has used 3 different courses of antibiotics and steroids Symptoms a bit better at present  Still does have pharyngeal clearing on a regular basis Not coughing as much Does not feel acutely short of breath  Since last visit did try a course of PPI for GERD -She did not notice any significant improvement in symptoms -Has held the medication for a few days now  She is known to have multiple allergens and does receive allergy shots Has been using a Nettie pot to clear nasal passages about 3 times a day 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   budesonide-formoterol (SYMBICORT) 160-4.5 MCG/ACT inhaler Inhale 2 puffs into the lungs every 12 (twelve) hours.   chlorpheniramine (CHLOR-TRIMETON) 4 MG tablet Take 4 mg by mouth 2 (two) times daily as needed for allergies.   [facility]S ASPIRIN ADULT LOW DOSE 81 MG chewable tablet CHEW 1 TABLET (81 MG TOTAL) BY MOUTH 2 (TWO) TIMES DAILY.   EPINEPHrine 0.3 mg/0.3 mL IJ SOAJ injection Inject 0.3 mg into the muscle as needed for anaphylaxis.   levocetirizine (XYZAL) 5 MG tablet Take 5 mg by mouth at bedtime.   Multiple Vitamin (MULTIVITAMINS PO) Take 1 Package by mouth daily. Shaklee  Vita pack   OVER THE COUNTER MEDICATION Take 2 tablets by mouth daily. Shaklee  [facility]   OVER THE COUNTER MEDICATION Take 1 tablet by mouth daily as needed (bronchitis). Shaklee   supplement for vitamin C   OVER THE COUNTER MEDICATION Take 5  tablets by mouth daily. Shaklee Osteomatrix   Protein POWD Take 2 Scoops by mouth daily. Shaklee life shake Add collagen   traMADol (ULTRAM) 50 MG tablet Take 2 tablets (100 mg total) by mouth every 6 (six) hours as needed.   traZODone (DESYREL) 50 MG tablet TAKE 1/2 TO 1 TABLET BY MOUTH AT BEDTIME AS NEEDED FOR SLEEP   TURMERIC PO Take 1 tablet by mouth daily. Shaklee   valACYclovir (VALTREX) 500 MG tablet Take 500 mg by mouth daily as needed (Fever blister).   [DISCONTINUED] gabapentin (NEURONTIN) 300 MG capsule TAKE 1 CAPSULE BY MOUTH EVERYDAY AT BEDTIME   [DISCONTINUED] pantoprazole (PROTONIX) 40 MG tablet Take 1 tablet (40 mg total) by mouth daily.   [DISCONTINUED] tiZANidine (ZANAFLEX) 4 MG tablet Take 1 tablet (4 mg total) by mouth every 8 (eight) hours as needed for muscle spasms.   No facility-administered encounter medications on file as of 12/16/2022.    Allergies as of 12/16/2022 - Review Complete 12/16/2022  Allergen Reaction Noted   Dog epithelium allergy skin test Itching and Shortness Of Breath 10/09/1961   Horse epithelium Shortness Of Breath 1961-03-23   Oxycodone Nausea Only 05/11/2022   Sulfa antibiotics Itching and Other (See Comments) 05/01/2013    Past Medical History:  Diagnosis Date   Arthritis    "knees" (05/19/2013)   Bronchitis    History of cardiac radiofrequency ablation 04/20/2013   Hx of echocardiogram    Echo (  12/15):  EF 60-65%, no RWMA, Gr 1 DD   SVT (supraventricular tachycardia)    WPW (Wolff-Parkinson-White syndrome) ~ 1983   a. s/p ablation 05/20/13.    Past Surgical History:  Procedure Laterality Date   ANAL FISSURE REPAIR  1980's   BREAST BIOPSY Left 2000's   EXCISION MORTON'S NEUROMA Left 05/04/2013   Procedure: LEFT SECOND MT  WEIL/SECOND WEBB SPACE NEUROMA EXCISION ;  Surgeon: [name], MD;  Location: [facility] OR;  Service: Orthopedics;  Laterality: Left;   FOOT SURGERY Left 09/2014   hardware removal   SUPRAVENTRICULAR TACHYCARDIA  ABLATION  05/19/2013   SUPRAVENTRICULAR TACHYCARDIA ABLATION N/A 05/19/2013   Procedure: SUPRAVENTRICULAR TACHYCARDIA ABLATION;  Surgeon: Marinus Maw, MD;  Location: [facility] CATH LAB;  Service: Cardiovascular;  Laterality: N/A;   TOTAL KNEE ARTHROPLASTY Right 03/10/2022   Procedure: Right TOTAL KNEE ARTHROPLASTY;  Surgeon: Kathryne Hitch, MD;  Location: [facility] OR;  Service: Orthopedics;  Laterality: Right;    Family History  Problem Relation Age of Onset   Diabetes Mother    Depression Mother    Melanoma Mother    Hypothyroidism Mother    Hyperlipidemia Mother    Cancer Father        ?osteosarcoma leg   Hypertension Father    Diabetes Brother    Arthritis Brother    Heart attack Neg Hx     Social History   Socioeconomic History   Marital status: Married    Spouse name: Loraine Leriche   Number of children: 2   Years of education: BS   Highest education level: Bachelor's degree (e.g., BA, AB, BS)  Occupational History    Employer: [employer]    Comment: [place]  Tobacco Use   Smoking status: Never   Smokeless tobacco: Never  Vaping Use   Vaping Use: Never used  Substance and Sexual Activity   Alcohol use: Not Currently    Comment: 1-2 glasses occassionaly   Drug use: No   Sexual activity: Yes    Birth control/protection: Inserts  Other Topics Concern   Not on file  Social History Narrative   Patient is married and lives at home with her husband.     New job in 2017, [employer] fast streamer   No regular exercise - diet is ok.   Social Determinants of Health   Financial Resource Strain: Low Risk  (10/23/2021)   Overall Financial Resource Strain (CARDIA)    Difficulty of Paying Living Expenses: Not hard at all  Food Insecurity: No Food Insecurity (10/23/2021)   Hunger Vital Sign    Worried About Running Out of Food in the Last Year: Never true    Ran Out of Food in the Last Year: Never true  Transportation Needs: No Transportation Needs (10/23/2021)   PRAPARE -  Administrator, Civil Service (Medical): No    Lack of Transportation (Non-Medical): No  Physical Activity: Sufficiently Active (10/23/2021)   Exercise Vital Sign    Days of Exercise per Week: 5 days    Minutes of Exercise per Session: 50 min  Stress: No Stress Concern Present (10/23/2021)   [employer] of Occupational Health - Occupational Stress Questionnaire    Feeling of Stress : Only a little  Social Connections: Socially Integrated (10/23/2021)   Social Connection and Isolation Panel [NHANES]    Frequency of Communication with Friends and Family: More than three times a week    Frequency of Social Gatherings with Friends and Family: Three times a  week    Attends Religious Services: More than 4 times per year    Active Member of Clubs or Organizations: Yes    Attends Banker Meetings: More than 4 times per year    Marital Status: Married  Catering manager Violence: Not on file    Review of Systems  Respiratory:  Positive for cough and shortness of breath. Negative for wheezing.     There were no vitals filed for this visit.    Physical Exam Constitutional:      Appearance: Normal appearance.  HENT:     Head: Normocephalic.  Eyes:     Pupils: Pupils are equal, round, and reactive to light.  Cardiovascular:     Rate and Rhythm: Normal rate and regular rhythm.     Heart sounds: No murmur heard.    No friction rub.  Pulmonary:     Effort: No respiratory distress.     Breath sounds: No stridor. No wheezing or rhonchi.  Musculoskeletal:     Cervical back: No rigidity or tenderness.  Neurological:     Mental Status: She is alert.  Psychiatric:        Mood and Affect: Mood normal.    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 -Previous CBC with differential shows eosinophilia of 600, has fluctuated over time -She has not been on steroid inhalers Wheeler-term, has used them with exacerbations and they have helped  Possible cough variant asthma  Plan/Recommendations: Will encouraged to continue Symbicort at present -If she gets to a point where she feels she wants to stay off Symbicort -She may stop Symbicort for about a week or 2 and monitor symptoms  Avoid known triggers as best as possible  Added Singulair today  Will continue Astelin, Xyzal  She is staying off Protonix  I will see her back in about 3 months  We did discuss possibly involving ENT in care to look for any pharyngeal issues which she feels remains -Denying nasal stuffiness or congestion at present   Virl Diamond MD [facility] 12/16/2022, 8:40 AM  CC: Kristian Covey, MD

## 2022-12-26 ENCOUNTER — Encounter: Payer: Self-pay | Admitting: Family Medicine

## 2023-01-13 ENCOUNTER — Other Ambulatory Visit (HOSPITAL_COMMUNITY): Payer: Self-pay

## 2023-02-16 ENCOUNTER — Other Ambulatory Visit (HOSPITAL_COMMUNITY): Payer: Self-pay

## 2023-02-16 ENCOUNTER — Telehealth: Payer: Self-pay

## 2023-02-16 NOTE — Telephone Encounter (Signed)
PA request received via CMM for Budesonide-Formoterol Fumarate 160-4.5MCG/ACT aerosol  PA has been faxed to [facility] and is pending determination.  Key: [id number]

## 2023-02-25 ENCOUNTER — Encounter: Payer: Self-pay | Admitting: Podiatry

## 2023-02-25 ENCOUNTER — Ambulatory Visit: Payer: 59

## 2023-02-25 ENCOUNTER — Ambulatory Visit (INDEPENDENT_AMBULATORY_CARE_PROVIDER_SITE_OTHER): Payer: 59 | Admitting: Podiatry

## 2023-02-25 ENCOUNTER — Ambulatory Visit (INDEPENDENT_AMBULATORY_CARE_PROVIDER_SITE_OTHER): Payer: 59

## 2023-02-25 ENCOUNTER — Other Ambulatory Visit: Payer: Self-pay | Admitting: Podiatry

## 2023-02-25 DIAGNOSIS — G8929 Other chronic pain: Secondary | ICD-10-CM

## 2023-02-25 DIAGNOSIS — M79672 Pain in left foot: Secondary | ICD-10-CM | POA: Diagnosis not present

## 2023-02-25 DIAGNOSIS — M778 Other enthesopathies, not elsewhere classified: Secondary | ICD-10-CM

## 2023-02-25 NOTE — Progress Notes (Signed)
Subjective:  Patient ID: Laura Wheeler, female    DOB: 07/03/1961,  MRN: [id number] HPI Chief Complaint  Patient presents with   Foot Pain    Plantar forefoot left - had surgery for neuroma by Dr, Doran Durand, he also wanted to shorten the 2nd and 3rd toes, after surgery she never really got better, with toes sticking up, "he basically ignored my concerns", went to [facility] doc-eval-had to have screw removed due to infection at the time, went to PT afterwards as well, now has knot with callusing on the bottom and makes it uncomfortable to walk and find comfortable shoes, would like to wear heels to her son's wedding in the fall   New Patient (Initial Visit)    62 y.o. female presents with the above complaint.   ROS: Denies fever chills nausea vomit muscle aches pains calf pain back pain chest pain shortness of breath.  Past Medical History:  Diagnosis Date   Arthritis    "knees" (05/19/2013)   Bronchitis    History of cardiac radiofrequency ablation 04/20/2013   Hx of echocardiogram    Echo (12/15):  EF 60-65%, no RWMA, Gr 1 DD   SVT (supraventricular tachycardia)    WPW (Wolff-Parkinson-White syndrome) ~ 1983   a. s/p ablation 05/20/13.   Past Surgical History:  Procedure Laterality Date   ANAL FISSURE REPAIR  1980's   BREAST BIOPSY Left 2000's   EXCISION MORTON'S NEUROMA Left 05/04/2013   Procedure: LEFT SECOND MT  WEIL/SECOND WEBB SPACE NEUROMA EXCISION ;  Surgeon: Wylene Simmer, MD;  Location: [place];  Service: Orthopedics;  Laterality: Left;   FOOT SURGERY Left 09/2014   hardware removal   SUPRAVENTRICULAR TACHYCARDIA ABLATION  05/19/2013   SUPRAVENTRICULAR TACHYCARDIA ABLATION N/A 05/19/2013   Procedure: SUPRAVENTRICULAR TACHYCARDIA ABLATION;  Surgeon: Evans Lance, MD;  [facility];  Service: Cardiovascular;  Laterality: N/A;   TOTAL KNEE ARTHROPLASTY Right 03/10/2022   Procedure: Right TOTAL KNEE ARTHROPLASTY;  Surgeon: Mcarthur Rossetti, MD;  Location: [place];  Service: Orthopedics;  Laterality: Right;    Current Outpatient Medications:    mupirocin ointment (BACTROBAN) 2 %, Apply topically 3 (three) times daily., Disp: , Rfl:    Azelastine-Fluticasone 137-50 MCG/ACT SUSP, Place 1 spray into both nostrils 2 (two) times daily., Disp: , Rfl:    budesonide-formoterol (SYMBICORT) 160-4.5 MCG/ACT inhaler, Inhale 2 puffs into the lungs every 12 (twelve) hours., Disp: 1 each, Rfl: 5   chlorpheniramine (CHLOR-TRIMETON) 4 MG tablet, Take 4 mg by mouth 2 (two) times daily as needed for allergies., Disp: , Rfl:    CVS ASPIRIN ADULT LOW DOSE 81 MG chewable tablet, CHEW 1 TABLET (81 MG TOTAL) BY MOUTH 2 (TWO) TIMES DAILY., Disp: 30 tablet, Rfl: 0   EPINEPHrine 0.3 mg/0.3 mL IJ SOAJ injection, Inject 0.3 mg into the muscle as needed for anaphylaxis., Disp: , Rfl:    levocetirizine (XYZAL) 5 MG tablet, Take 5 mg by mouth at bedtime., Disp: , Rfl:    montelukast (SINGULAIR) 10 MG tablet, Take 1 tablet (10 mg total) by mouth at bedtime., Disp: 30 tablet, Rfl: 3   Multiple Vitamin (MULTIVITAMINS PO), Take 1 Package by mouth daily. Shaklee  Vita pack, Disp: , Rfl:    OVER THE COUNTER MEDICATION, Take 2 tablets by mouth daily. Shaklee  [facility], Disp: , Rfl:    OVER THE COUNTER MEDICATION, Take 1 tablet by mouth daily as needed (bronchitis). Shaklee   supplement for vitamin C, Disp: ,  Rfl:    OVER THE COUNTER MEDICATION, Take 5 tablets by mouth daily. Shaklee Osteomatrix, Disp: , Rfl:    Protein POWD, Take 2 Scoops by mouth daily. Shaklee life shake Add collagen, Disp: , Rfl:    traMADol (ULTRAM) 50 MG tablet, Take 2 tablets (100 mg total) by mouth every 6 (six) hours as needed., Disp: 60 tablet, Rfl: 0   traZODone (DESYREL) 50 MG tablet, TAKE 1/2 TO 1 TABLET BY MOUTH AT BEDTIME AS NEEDED FOR SLEEP, Disp: 90 tablet, Rfl: 1   TURMERIC PO, Take 1 tablet by mouth daily. Shaklee, Disp: , Rfl:    valACYclovir (VALTREX) 500 MG tablet, Take 500 mg by mouth daily  as needed (Fever blister)., Disp: , Rfl:   Allergies  Allergen Reactions   Dog Epithelium (Canis Lupus Familiaris) Itching and Shortness Of Breath    Other reaction(s): Cough, Respiratory Distress   Horse Epithelium Shortness Of Breath    Other reaction(s): Cough, Respiratory Distress   Oxycodone Nausea Only   Sulfa Antibiotics Itching and Other (See Comments)    Doesn't remember reaction - 20 years ago   Review of Systems Objective:  There were no vitals filed for this visit.  General: Well developed, nourished, in no acute distress, alert and oriented x3   Dermatological: Skin is warm, dry and supple bilateral. Nails x 10 are well maintained; remaining integument appears unremarkable at this time. There are no open sores, no preulcerative lesions, no rash or signs of infection present.  Vascular: Dorsalis Pedis artery and Posterior Tibial artery pedal pulses are 2/4 bilateral with immedate capillary fill time. Pedal hair growth present. No varicosities and no lower extremity edema present bilateral.   Neruologic: Grossly intact via light touch bilateral. Vibratory intact via tuning fork bilateral. Protective threshold with Semmes Wienstein monofilament intact to all pedal sites bilateral. Patellar and Achilles deep tendon reflexes 2+ bilateral. No Babinski or clonus noted bilateral.   Musculoskeletal: No gross boney pedal deformities bilateral. No pain, crepitus, or limitation noted with foot and ankle range of motion bilateral. Muscular strength 5/5 in all groups tested bilateral.  Moderate to severe discomfort with attempted range of motion second and third metatarsal phalangeal joints.  There is severely limited to about 10 degrees of dorsiflexion and pain on range of motion.  There is no crepitation the limitation is severe.  She does have a scar overlying the third metatarsal head which is puckered and appears to be scarred down to the deep structures.  Large palpable mass on the  plantar aspect of the third metatarsal head left foot appears to be osseous does not demonstrate as a soft tissue mass.  Gait: Unassisted, Nonantalgic.    Radiographs:  Radiographs taken today demonstrate osseously mature individual left foot demonstrates rectus toes.  No acute osseous abnormalities is obvious that she has had surgery on the second and third metatarsal and the joint spaces are narrowed.  There is deformity to the base of the third proximal phalanx.  Assessment & Plan:   Assessment: Chronic pain secondary to multiple surgeries probable tear of the plantar fascia with a plantarflexed third metatarsal head left foot cannot rule out a capsular tear versus bony block versus scar tissue.  Plan: Currently requesting an MRI since all conservative therapies have failed since initial surgery.  She had initial surgery back in 2014 which then had to be redone in 2015.  She has had chronic pain in the foot since that time.  Is now searching for surgical  intervention.     Rahmir Beever T. [place], [place]

## 2023-03-11 ENCOUNTER — Encounter: Payer: Self-pay | Admitting: Orthopaedic Surgery

## 2023-03-11 ENCOUNTER — Other Ambulatory Visit (INDEPENDENT_AMBULATORY_CARE_PROVIDER_SITE_OTHER): Payer: 59

## 2023-03-11 ENCOUNTER — Ambulatory Visit (INDEPENDENT_AMBULATORY_CARE_PROVIDER_SITE_OTHER): Payer: 59 | Admitting: Orthopaedic Surgery

## 2023-03-11 DIAGNOSIS — Z96651 Presence of right artificial knee joint: Secondary | ICD-10-CM

## 2023-03-11 NOTE — Progress Notes (Signed)
The patient is now exactly 1 year status post a right total knee arthroplasty.  Her postoperative course was complicated by arthrofibrosis and did require manipulation under anesthesia.  She now has entirely full range of motion of her knee.  She still has some stiffness and cannot kneel well but she does perform Pilates on a regular basis.  Her right knee shows no effusion today.  Her range of motion is entirely full and the knee is ligamentously stable.  2 views of the right knee show well-seated and well aligned total knee arthroplasty with no complicating features.  At this point follow-up for right knee can be as needed.  She is having some issues with her left knee and if it becomes problematic she knows to let us know.

## 2023-03-12 ENCOUNTER — Other Ambulatory Visit (HOSPITAL_COMMUNITY): Payer: Self-pay

## 2023-03-12 NOTE — Telephone Encounter (Signed)
Preferred medications from insurance is the generic Advair Diskus and Breo Ellipta, please advise if patient has failed and send in new prescription if appropriate.

## 2023-03-12 NOTE — Telephone Encounter (Signed)
Reviewed patient's chart, she has tried Breo 100 before in the past. Does she need to fail the Advair as well in order for the Symbicort to be approved?

## 2023-03-14 ENCOUNTER — Other Ambulatory Visit: Payer: Self-pay | Admitting: Pulmonary Disease

## 2023-03-15 ENCOUNTER — Encounter: Payer: Self-pay | Admitting: Podiatry

## 2023-03-16 ENCOUNTER — Other Ambulatory Visit: Payer: 59

## 2023-03-18 ENCOUNTER — Other Ambulatory Visit: Payer: Self-pay | Admitting: Pulmonary Disease

## 2023-03-18 ENCOUNTER — Other Ambulatory Visit (HOSPITAL_COMMUNITY): Payer: Self-pay

## 2023-03-18 LAB — HM COLONOSCOPY

## 2023-03-18 MED ORDER — WIXELA INHUB 250-50 MCG/ACT IN AEPB
1.0000 | INHALATION_SPRAY | Freq: Two times a day (BID) | RESPIRATORY_TRACT | 3 refills | Status: DC
Start: 2023-03-18 — End: 2023-06-29

## 2023-03-18 NOTE — Telephone Encounter (Signed)
Prescription for [facility] sent into pharmacy

## 2023-03-18 NOTE — Telephone Encounter (Signed)
PA resubmitted to show previous trial of Breo and is pending determination.  Key: [id number]

## 2023-03-18 NOTE — Telephone Encounter (Signed)
PA has been DENIED due to:   Why your request was denied: Your plan only covers this drug when you meet one of these options: A) You have tried other drugs your plan covers (preferred drugs), and they did not work well for you, or B) Your doctor gives [place] a medical reason you cannot take those other drugs. For your plan, you may need to try up to three preferred drugs. We have denied your request because you do not meet any of these conditions. We reviewed the information we had. Your request has been denied. Your doctor can send [place] any new or missing information for [place] to review. The preferred drugs for your plan are: fluticasone-salmeterol [id number], [id number]), Wixela Inhub, Breo Ellipta (except certain NDCs) (Requirement: 2 alternatives)

## 2023-03-18 NOTE — Telephone Encounter (Signed)
Spoke with patient. Advised Dr. Jenetta Downer has sent in [place] to her pharmacy. Advised patient to call the office back if this doesn't work well for her. She verbalized understanding. Nothing further needed.

## 2023-05-14 ENCOUNTER — Encounter: Payer: 59 | Admitting: Family Medicine

## 2023-05-22 ENCOUNTER — Other Ambulatory Visit: Payer: Self-pay | Admitting: Family Medicine

## 2023-05-28 ENCOUNTER — Ambulatory Visit (INDEPENDENT_AMBULATORY_CARE_PROVIDER_SITE_OTHER): Payer: 59 | Admitting: Family Medicine

## 2023-05-28 VITALS — BP 102/66 | HR 78 | Temp 97.8°F | Ht 66.14 in | Wt 167.9 lb

## 2023-05-28 DIAGNOSIS — Z23 Encounter for immunization: Secondary | ICD-10-CM

## 2023-05-28 DIAGNOSIS — Z0000 Encounter for general adult medical examination without abnormal findings: Secondary | ICD-10-CM

## 2023-05-28 LAB — HEPATIC FUNCTION PANEL
ALT: 18 U/L (ref 0–35)
AST: 20 U/L (ref 0–37)
Albumin: 4.5 g/dL (ref 3.5–5.2)
Alkaline Phosphatase: 98 U/L (ref 39–117)
Bilirubin, Direct: 0.1 mg/dL (ref 0.0–0.3)
Total Bilirubin: 0.6 mg/dL (ref 0.2–1.2)
Total Protein: 8.3 g/dL (ref 6.0–8.3)

## 2023-05-28 LAB — CBC WITH DIFFERENTIAL/PLATELET
Basophils Absolute: 0.1 10*3/uL (ref 0.0–0.1)
Basophils Relative: 1.5 % (ref 0.0–3.0)
Eosinophils Absolute: 0.6 10*3/uL (ref 0.0–0.7)
Eosinophils Relative: 8.6 % — ABNORMAL HIGH (ref 0.0–5.0)
HCT: 42.5 % (ref 36.0–46.0)
Hemoglobin: 14.1 g/dL (ref 12.0–15.0)
Lymphocytes Relative: 39.2 % (ref 12.0–46.0)
Lymphs Abs: 2.6 10*3/uL (ref 0.7–4.0)
MCHC: 33.1 g/dL (ref 30.0–36.0)
MCV: 91.9 fl (ref 78.0–100.0)
Monocytes Absolute: 0.6 10*3/uL (ref 0.1–1.0)
Monocytes Relative: 9.2 % (ref 3.0–12.0)
Neutro Abs: 2.8 10*3/uL (ref 1.4–7.7)
Neutrophils Relative %: 41.5 % — ABNORMAL LOW (ref 43.0–77.0)
Platelets: 389 10*3/uL (ref 150.0–400.0)
RBC: 4.62 Mil/uL (ref 3.87–5.11)
RDW: 13.4 % (ref 11.5–15.5)
WBC: 6.7 10*3/uL (ref 4.0–10.5)

## 2023-05-28 LAB — LIPID PANEL
Cholesterol: 170 mg/dL (ref 0–200)
HDL: 54.5 mg/dL (ref >39.00)
LDL Cholesterol: 83 mg/dL (ref 0–99)
NonHDL: 115.68
Total CHOL/HDL Ratio: 3
Triglycerides: 161 mg/dL — ABNORMAL HIGH (ref 0.0–149.0)
VLDL: 32.2 mg/dL (ref 0.0–40.0)

## 2023-05-28 LAB — BASIC METABOLIC PANEL
BUN: 14 mg/dL (ref 6–23)
CO2: 26 mEq/L (ref 19–32)
Calcium: 9.8 mg/dL (ref 8.4–10.5)
Chloride: 102 mEq/L (ref 96–112)
Creatinine, Ser: 0.71 mg/dL (ref 0.40–1.20)
GFR: 91.48 mL/min (ref >60.00)
Glucose, Bld: 87 mg/dL (ref 70–99)
Potassium: 4.2 mEq/L (ref 3.5–5.1)
Sodium: 138 mEq/L (ref 135–145)

## 2023-05-28 LAB — HEMOGLOBIN A1C: Hgb A1c MFr Bld: 5.7 % (ref 4.6–6.5)

## 2023-05-28 NOTE — Progress Notes (Signed)
Established Patient Office Visit  Subjective   Patient ID: Laura Wheeler, female    DOB: December 11, 1961  Age: 62 y.o. MRN: [id number]  Chief Complaint  Patient presents with   Annual Exam    HPI   Terressa is here for complete physical.  She still sees GYN regularly for Pap smears and mammograms.  Past medical history reviewed.  She has history of Wolff-Parkinson-White syndrome and has had previous ablation.  Her other medical problems include osteoarthritis with previous right total knee replacement, history of kidney stones, and GERD with chronic intermittent cough.  She has seen ENT and pulmonary within the past year and was felt that her chronic intermittent cough was related to GERD.  She has tried acid relievers and diet modification without much improvement.  Still has symptoms intermittently.  Currently no significant cough.  Health maintenance reviewed  -She states she had a follow-up colonoscopy in February with benign hyperplastic polyps and recommended 10-year follow-up  -Tetanus due at this time  -Mammogram Pap smear up-to-date  -Previous hepatitis C screening negative  -Shingles vaccine complete  Family history-brother with type 2 diabetes.  Mother had history of type 2 diabetes as well and passed away from complications of sepsis.  Her mother also had history of melanoma and hypothyroidism.  Father died age 60 chondrosarcoma.  Social history-married.  2 sons.  No grandchildren.  Never smoked.  Infrequent alcohol use.  Research scientist (life sciences) projects.  Past Medical History:  Diagnosis Date   Arthritis    "knees" (05/19/2013)   Bronchitis    History of cardiac radiofrequency ablation 04/20/2013   Hx of echocardiogram    Echo (12/15):  EF 60-65%, no RWMA, Gr 1 DD   SVT (supraventricular tachycardia)    WPW (Wolff-Parkinson-White syndrome) ~ 1983   a. s/p ablation 05/20/13.   Past Surgical History:  Procedure Laterality Date   ANAL FISSURE REPAIR  1980's    BREAST BIOPSY Left 2000's   EXCISION MORTON'S NEUROMA Left 05/04/2013   Procedure: LEFT SECOND MT  WEIL/SECOND WEBB SPACE NEUROMA EXCISION ;  Surgeon: [name], MD;  Location: [facility] OR;  Service: Orthopedics;  Laterality: Left;   FOOT SURGERY Left 09/2014   hardware removal   SUPRAVENTRICULAR TACHYCARDIA ABLATION  05/19/2013   SUPRAVENTRICULAR TACHYCARDIA ABLATION N/A 05/19/2013   Procedure: SUPRAVENTRICULAR TACHYCARDIA ABLATION;  Surgeon: Marinus Maw, MD;  Location: [facility] CATH LAB;  Service: Cardiovascular;  Laterality: N/A;   TOTAL KNEE ARTHROPLASTY Right 03/10/2022   Procedure: Right TOTAL KNEE ARTHROPLASTY;  Surgeon: Kathryne Hitch, MD;  Location: [facility] OR;  Service: Orthopedics;  Laterality: Right;    reports that she has never smoked. She has never used smokeless tobacco. She reports that she does not currently use alcohol. She reports that she does not use drugs. family history includes Arthritis in her brother; Cancer in her father; Depression in her mother; Diabetes in her brother and mother; Hyperlipidemia in her mother; Hypertension in her father; Hypothyroidism in her mother; Melanoma in her mother. Allergies  Allergen Reactions   Dog Epithelium (Canis Lupus Familiaris) Itching and Shortness Of Breath    Other reaction(s): Cough, Respiratory Distress   Horse Epithelium Shortness Of Breath    Other reaction(s): Cough, Respiratory Distress   Oxycodone Nausea Only   Sulfa Antibiotics Itching and Other (See Comments)    Doesn't remember reaction - 20 years ago    Review of Systems  Constitutional:  Negative for chills, fever, malaise/fatigue and weight loss.  HENT:  Negative for hearing loss.   Eyes:  Negative for blurred vision and double vision.  Respiratory:  Negative for cough and shortness of breath.   Cardiovascular:  Negative for chest pain, palpitations and leg swelling.  Gastrointestinal:  Negative for abdominal pain, blood in stool, constipation and diarrhea.   Genitourinary:  Negative for dysuria.  Skin:  Negative for rash.  Neurological:  Negative for dizziness, speech change, seizures, loss of consciousness and headaches.  Psychiatric/Behavioral:  Negative for depression.       Objective:     BP 102/66 (BP Location: Left Arm, Patient Position: Sitting, Cuff Size: Normal)   Pulse 78   Temp 97.8 F (36.6 C) (Oral)   Ht 5' 6.14" (1.68 m)   Wt 167 lb 14.4 oz (76.2 kg)   LMP 09/20/2014   SpO2 96%   BMI 26.98 kg/m  BP Readings from Last 3 Encounters:  05/28/23 102/66  12/16/22 98/60  11/04/22 106/60   Wt Readings from Last 3 Encounters:  05/28/23 167 lb 14.4 oz (76.2 kg)  12/16/22 169 lb 3.2 oz (76.7 kg)  11/04/22 166 lb (75.3 kg)      Physical Exam Vitals reviewed.  Constitutional:      General: She is not in acute distress.    Appearance: She is well-developed. She is not ill-appearing.  HENT:     Head: Normocephalic and atraumatic.  Eyes:     Pupils: Pupils are equal, round, and reactive to light.  Neck:     Thyroid: No thyromegaly.  Cardiovascular:     Rate and Rhythm: Normal rate and regular rhythm.     Heart sounds: Normal heart sounds. No murmur heard. Pulmonary:     Effort: No respiratory distress.     Breath sounds: Normal breath sounds. No wheezing or rales.  Abdominal:     General: Bowel sounds are normal. There is no distension.     Palpations: Abdomen is soft. There is no mass.     Tenderness: There is no abdominal tenderness. There is no guarding or rebound.  Musculoskeletal:        General: Normal range of motion.     Cervical back: Normal range of motion and neck supple.  Lymphadenopathy:     Cervical: No cervical adenopathy.  Skin:    Findings: No rash.     Comments: Vertical scar right knee from previous right total knee replacement  Neurological:     Mental Status: She is alert and oriented to person, place, and time.     Cranial Nerves: No cranial nerve deficit.  Psychiatric:         Behavior: Behavior normal.        Thought Content: Thought content normal.        Judgment: Judgment normal.      No results found for any visits on 05/28/23.    The 10-year ASCVD risk score (Arnett DK, et al., 2019) is: 2.4%    Assessment & Plan:   Problem List Items Addressed This Visit   None Visit Diagnoses     Physical exam    -  Primary   Relevant Orders   Basic metabolic panel   Lipid panel   CBC with Differential/Platelet   Hepatic function panel   Hemoglobin A1c     Healthy 62 year old female with chronic problems including history of with Parkinson White syndrome, GERD, osteoarthritis.  Positive family history of type 2 diabetes as above.  -Obtain screening labs as above -Tdap given -Discussed GERD  management.  She is aware of positive lifestyle modifications.  Handout given. -Continue annual flu vaccine -Prevnar 20 by age 64 -Colonoscopy up-to-date -She will continue regular follow-up with GYN for Pap smear mammogram  No follow-ups on file.    Evelena Peat, MD

## 2023-05-28 NOTE — Addendum Note (Signed)
Addended by: Christy Sartorius on: 05/28/2023 08:39 AM   Modules accepted: Orders

## 2023-06-01 ENCOUNTER — Encounter: Payer: Self-pay | Admitting: Family Medicine

## 2023-06-15 ENCOUNTER — Encounter: Payer: Self-pay | Admitting: Family Medicine

## 2023-06-15 MED ORDER — PANTOPRAZOLE SODIUM 40 MG PO TBEC: 40.0000 mg | DELAYED_RELEASE_TABLET | Freq: Every day | ORAL | 1 refills | Status: DC

## 2023-06-26 ENCOUNTER — Other Ambulatory Visit: Payer: Self-pay | Admitting: Pulmonary Disease

## 2023-07-09 ENCOUNTER — Other Ambulatory Visit: Payer: Self-pay | Admitting: Family Medicine

## 2023-09-11 ENCOUNTER — Other Ambulatory Visit: Payer: Self-pay | Admitting: Pulmonary Disease

## 2023-10-12 ENCOUNTER — Encounter: Payer: Self-pay | Admitting: Family Medicine

## 2023-10-29 ENCOUNTER — Other Ambulatory Visit: Payer: 59

## 2023-10-29 ENCOUNTER — Encounter: Payer: Self-pay | Admitting: Podiatry

## 2023-11-01 ENCOUNTER — Ambulatory Visit
Admission: RE | Admit: 2023-11-01 | Discharge: 2023-11-01 | Disposition: A | Discharge status: Home / Self Care | Payer: 59 | Source: Ambulatory Visit | Attending: Podiatry | Admitting: Podiatry

## 2023-11-01 DIAGNOSIS — G8929 Other chronic pain: Secondary | ICD-10-CM

## 2023-11-01 DIAGNOSIS — M778 Other enthesopathies, not elsewhere classified: Secondary | ICD-10-CM

## 2023-11-03 ENCOUNTER — Ambulatory Visit (INDEPENDENT_AMBULATORY_CARE_PROVIDER_SITE_OTHER): Payer: 59 | Admitting: Family Medicine

## 2023-11-03 VITALS — BP 112/70 | HR 95 | Temp 98.3°F | Ht 66.0 in | Wt 149.8 lb

## 2023-11-03 DIAGNOSIS — R059 Cough, unspecified: Secondary | ICD-10-CM | POA: Diagnosis not present

## 2023-11-03 DIAGNOSIS — J029 Acute pharyngitis, unspecified: Secondary | ICD-10-CM

## 2023-11-03 LAB — POCT RAPID STREP A (OFFICE): Rapid Strep A Screen: NEGATIVE

## 2023-11-03 LAB — POC COVID19 BINAXNOW: SARS Coronavirus 2 Ag: NEGATIVE

## 2023-11-03 MED ORDER — METHYLPREDNISOLONE ACETATE 80 MG/ML IJ SUSP
80.0000 mg | Freq: Once | INTRAMUSCULAR | Status: AC
  Administered 2023-11-03: 80 mg via INTRAMUSCULAR

## 2023-11-03 NOTE — Progress Notes (Signed)
Established Patient Office Visit  Subjective   Patient ID: Laura Wheeler, female    DOB: 11/19/1961  Age: 62 y.o. MRN: [id number]  Chief Complaint  Patient presents with   Headache    Patient complains of headache, x3 days    Hoarse    Patient complains of hoarseness, x3 days, Tried Albuterol     HPI   Laura Wheeler is seen with onset this past weekend of some headache, hoarseness, nasal congestion symptoms and sore throat.  She helped out with a [place] stew on Saturday.  Then developed symptoms above the next day.  She had O2 sats earlier this week of 83%.  She does have past history of reactive airway issues and takes Wixela regularly.  She does not feel tight in the chest today.  No known sick contacts.  Home COVID testing x 2 negative.  No significant cough.  Is not having much nasal drip but does feel very congested nasally.  No documented fever.  Her main concern is that she is post to leave tomorrow morning for [place] for her son's wedding.  She has done tremendous job with weight loss since her physical last summer after scaling back overall calories and reducing portion sizes.  Has also restricted sugars and starches.  Has lost approximately 20 pounds  Past Medical History:  Diagnosis Date   Arthritis    "knees" (05/19/2013)   Bronchitis    History of cardiac radiofrequency ablation 04/20/2013   Hx of echocardiogram    Echo (12/15):  EF 60-65%, no RWMA, Gr 1 DD   SVT (supraventricular tachycardia) (HCC)    WPW (Wolff-Parkinson-White syndrome) ~ 1983   a. s/p ablation 05/20/13.   Past Surgical History:  Procedure Laterality Date   ANAL FISSURE REPAIR  1980's   BREAST BIOPSY Left 2000's   EXCISION MORTON'S NEUROMA Left 05/04/2013   Procedure: LEFT SECOND MT  WEIL/SECOND WEBB SPACE NEUROMA EXCISION ;  Surgeon: [name], MD;  Location: [facility] OR;  Service: Orthopedics;  Laterality: Left;   FOOT SURGERY Left 09/2014   hardware removal   SUPRAVENTRICULAR TACHYCARDIA  ABLATION  05/19/2013   SUPRAVENTRICULAR TACHYCARDIA ABLATION N/A 05/19/2013   Procedure: SUPRAVENTRICULAR TACHYCARDIA ABLATION;  Surgeon: Marinus Maw, MD;  Location: [facility] CATH LAB;  Service: Cardiovascular;  Laterality: N/A;   TOTAL KNEE ARTHROPLASTY Right 03/10/2022   Procedure: Right TOTAL KNEE ARTHROPLASTY;  Surgeon: Kathryne Hitch, MD;  Location: [facility] OR;  Service: Orthopedics;  Laterality: Right;    reports that she has never smoked. She has never used smokeless tobacco. She reports that she does not currently use alcohol. She reports that she does not use drugs. family history includes Arthritis in her brother; Cancer in her father; Depression in her mother; Diabetes in her brother and mother; Hyperlipidemia in her mother; Hypertension in her father; Hypothyroidism in her mother; Melanoma in her mother. Allergies  Allergen Reactions   Dog Epithelium (Canis Lupus Familiaris) Itching and Shortness Of Breath    Other reaction(s): Cough, Respiratory Distress   Horse Epithelium Shortness Of Breath    Other reaction(s): Cough, Respiratory Distress   Oxycodone Nausea Only   Sulfa Antibiotics Itching and Other (See Comments)    Doesn't remember reaction - 20 years ago    Review of Systems  Constitutional:  Negative for chills and fever.  HENT:  Positive for congestion and sore throat. Negative for ear pain.   Respiratory:  Negative for cough.   Cardiovascular:  Negative for chest pain.  Objective:     BP 112/70 (BP Location: Left Arm, Patient Position: Sitting, Cuff Size: Normal)   Pulse 95   Temp 98.3 F (36.8 C) (Oral)   Ht 5\' 6"  (1.676 m)   Wt 149 lb 12.8 oz (67.9 kg)   LMP 09/20/2014   SpO2 97%   BMI 24.18 kg/m  BP Readings from Last 3 Encounters:  11/03/23 112/70  05/28/23 102/66  12/16/22 98/60   Wt Readings from Last 3 Encounters:  11/03/23 149 lb 12.8 oz (67.9 kg)  05/28/23 167 lb 14.4 oz (76.2 kg)  12/16/22 169 lb 3.2 oz (76.7 kg)      Physical  Exam Vitals reviewed.  Constitutional:      Appearance: She is well-developed.  HENT:     Mouth/Throat:     Mouth: Mucous membranes are moist.     Comments: She has what appear to be a couple very small early aphthous type ulcers.  No exudate.  Some thick white mucus posterior pharynx otherwise clear.  No exudate Pulmonary:     Effort: Pulmonary effort is normal.     Breath sounds: Normal breath sounds. No rales.  Musculoskeletal:     Cervical back: Neck supple.  Lymphadenopathy:     Cervical: No cervical adenopathy.  Neurological:     Mental Status: She is alert.      Results for orders placed or performed in visit on 11/03/23  POC COVID-19  Result Value Ref Range   SARS Coronavirus 2 Ag Negative Negative  POC Rapid Strep A  Result Value Ref Range   Rapid Strep A Screen Negative Negative      The 10-year ASCVD risk score (Arnett DK, et al., 2019) is: 2.8%    Assessment & Plan:   Problem List Items Addressed This Visit   None Visit Diagnoses     Cough, unspecified type    -  Primary   Relevant Orders   POC COVID-19 (Completed)   Sore throat       Relevant Orders   POC COVID-19 (Completed)   POC Rapid Strep A (Completed)     Upper respiratory symptoms for 3 days as above.  Rapid strep testing and COVID testing negative.  Suspect viral infection.  She does have history of reactive airway issues and had some low O2 sat yesterday and possible intermittent wheezing.  We suggested the following  -Continue over-the-counter Mucinex and stay well-hydrated -Continue albuterol as needed -Continue Wixela inhaler twice daily -Depo-Medrol 80 mg IM given -Cautioned not to use Afrin more than 2 to 3 days continuously -Follow-up for any fever or worsening symptoms  No follow-ups on file.    Evelena Peat, MD

## 2023-11-10 ENCOUNTER — Other Ambulatory Visit: Payer: Self-pay

## 2023-11-10 ENCOUNTER — Emergency Department (HOSPITAL_BASED_OUTPATIENT_CLINIC_OR_DEPARTMENT_OTHER): Payer: 59

## 2023-11-10 ENCOUNTER — Emergency Department (HOSPITAL_BASED_OUTPATIENT_CLINIC_OR_DEPARTMENT_OTHER): Payer: 59 | Admitting: Radiology

## 2023-11-10 ENCOUNTER — Inpatient Hospital Stay (HOSPITAL_BASED_OUTPATIENT_CLINIC_OR_DEPARTMENT_OTHER)
Admission: EM | Admit: 2023-11-10 | Discharge: 2023-11-13 | DRG: 871 | Disposition: A | Discharge status: Home Health | Payer: 59 | Attending: Family Medicine | Admitting: Family Medicine

## 2023-11-10 DIAGNOSIS — Z96651 Presence of right artificial knee joint: Secondary | ICD-10-CM | POA: Diagnosis present

## 2023-11-10 DIAGNOSIS — Z79899 Other long term (current) drug therapy: Secondary | ICD-10-CM

## 2023-11-10 DIAGNOSIS — A419 Sepsis, unspecified organism: Principal | ICD-10-CM | POA: Diagnosis present

## 2023-11-10 DIAGNOSIS — Z83438 Family history of other disorder of lipoprotein metabolism and other lipidemia: Secondary | ICD-10-CM

## 2023-11-10 DIAGNOSIS — Z8249 Family history of ischemic heart disease and other diseases of the circulatory system: Secondary | ICD-10-CM

## 2023-11-10 DIAGNOSIS — Z8261 Family history of arthritis: Secondary | ICD-10-CM

## 2023-11-10 DIAGNOSIS — J45909 Unspecified asthma, uncomplicated: Secondary | ICD-10-CM | POA: Diagnosis present

## 2023-11-10 DIAGNOSIS — Z882 Allergy status to sulfonamides status: Secondary | ICD-10-CM

## 2023-11-10 DIAGNOSIS — Z833 Family history of diabetes mellitus: Secondary | ICD-10-CM

## 2023-11-10 DIAGNOSIS — Z888 Allergy status to other drugs, medicaments and biological substances status: Secondary | ICD-10-CM

## 2023-11-10 DIAGNOSIS — J189 Pneumonia, unspecified organism: Secondary | ICD-10-CM | POA: Diagnosis not present

## 2023-11-10 DIAGNOSIS — T17500A Unspecified foreign body in bronchus causing asphyxiation, initial encounter: Secondary | ICD-10-CM | POA: Insufficient documentation

## 2023-11-10 DIAGNOSIS — Z1152 Encounter for screening for COVID-19: Secondary | ICD-10-CM

## 2023-11-10 DIAGNOSIS — R6520 Severe sepsis without septic shock: Secondary | ICD-10-CM | POA: Diagnosis present

## 2023-11-10 DIAGNOSIS — Z885 Allergy status to narcotic agent status: Secondary | ICD-10-CM

## 2023-11-10 DIAGNOSIS — J9601 Acute respiratory failure with hypoxia: Secondary | ICD-10-CM | POA: Diagnosis present

## 2023-11-10 LAB — CBC WITH DIFFERENTIAL/PLATELET
Abs Immature Granulocytes: 0.1 10*3/uL — ABNORMAL HIGH (ref 0.00–0.07)
Basophils Absolute: 0.1 10*3/uL (ref 0.0–0.1)
Basophils Relative: 0 %
Eosinophils Absolute: 0.1 10*3/uL (ref 0.0–0.5)
Eosinophils Relative: 1 %
HCT: 35.4 % — ABNORMAL LOW (ref 36.0–46.0)
Hemoglobin: 12 g/dL (ref 12.0–15.0)
Immature Granulocytes: 1 %
Lymphocytes Relative: 16 %
Lymphs Abs: 2.9 10*3/uL (ref 0.7–4.0)
MCH: 30.5 pg (ref 26.0–34.0)
MCHC: 33.9 g/dL (ref 30.0–36.0)
MCV: 89.8 fL (ref 80.0–100.0)
Monocytes Absolute: 1.4 10*3/uL — ABNORMAL HIGH (ref 0.1–1.0)
Monocytes Relative: 8 %
Neutro Abs: 13.4 10*3/uL — ABNORMAL HIGH (ref 1.7–7.7)
Neutrophils Relative %: 74 %
Platelets: 388 10*3/uL (ref 150–400)
RBC: 3.94 MIL/uL (ref 3.87–5.11)
RDW: 13.7 % (ref 11.5–15.5)
WBC: 17.9 10*3/uL — ABNORMAL HIGH (ref 4.0–10.5)
nRBC: 0 % (ref 0.0–0.2)

## 2023-11-10 LAB — BASIC METABOLIC PANEL
Anion gap: 9 (ref 5–15)
BUN: 8 mg/dL (ref 8–23)
CO2: 24 mmol/L (ref 22–32)
Calcium: 9.1 mg/dL (ref 8.9–10.3)
Chloride: 105 mmol/L (ref 98–111)
Creatinine, Ser: 0.53 mg/dL (ref 0.44–1.00)
GFR, Estimated: >60 mL/min (ref >60)
Glucose, Bld: 90 mg/dL (ref 70–99)
Potassium: 3.5 mmol/L (ref 3.5–5.1)
Sodium: 138 mmol/L (ref 135–145)

## 2023-11-10 LAB — LACTIC ACID, PLASMA: Lactic Acid, Venous: 1.4 mmol/L (ref 0.5–1.9)

## 2023-11-10 LAB — RESP PANEL BY RT-PCR (RSV, FLU A&B, COVID)  RVPGX2
Influenza A by PCR: NEGATIVE
Influenza B by PCR: NEGATIVE
Resp Syncytial Virus by PCR: NEGATIVE
SARS Coronavirus 2 by RT PCR: NEGATIVE

## 2023-11-10 LAB — GROUP A STREP BY PCR: Group A Strep by PCR: NOT DETECTED

## 2023-11-10 MED ORDER — DIPHENHYDRAMINE HCL 50 MG/ML IJ SOLN
25.0000 mg | Freq: Once | INTRAMUSCULAR | Status: AC
  Administered 2023-11-10: 25 mg via INTRAVENOUS
  Filled 2023-11-10: qty 1

## 2023-11-10 MED ORDER — LIDOCAINE VISCOUS HCL 2 % MT SOLN
15.0000 mL | Freq: Once | OROMUCOSAL | Status: AC
  Administered 2023-11-10: 15 mL via OROMUCOSAL
  Filled 2023-11-10: qty 15

## 2023-11-10 MED ORDER — IOHEXOL 350 MG/ML SOLN
100.0000 mL | Freq: Once | INTRAVENOUS | Status: AC | PRN
  Administered 2023-11-10: 75 mL via INTRAVENOUS

## 2023-11-10 MED ORDER — AZITHROMYCIN 500 MG IV SOLR
500.0000 mg | INTRAVENOUS | Status: DC
  Administered 2023-11-10 – 2023-11-11 (×2): 500 mg via INTRAVENOUS
  Filled 2023-11-10 (×2): qty 5

## 2023-11-10 MED ORDER — METOCLOPRAMIDE HCL 5 MG/ML IJ SOLN
10.0000 mg | Freq: Once | INTRAMUSCULAR | Status: AC
  Administered 2023-11-10: 10 mg via INTRAVENOUS
  Filled 2023-11-10: qty 2

## 2023-11-10 MED ORDER — ALBUTEROL SULFATE HFA 108 (90 BASE) MCG/ACT IN AERS
2.0000 | INHALATION_SPRAY | RESPIRATORY_TRACT | Status: DC | PRN
Start: 2023-11-10 — End: 2023-11-13

## 2023-11-10 MED ORDER — ACETAMINOPHEN 500 MG PO TABS
1000.0000 mg | ORAL_TABLET | Freq: Once | ORAL | Status: AC
  Administered 2023-11-10: 1000 mg via ORAL
  Filled 2023-11-10: qty 2

## 2023-11-10 MED ORDER — ALBUTEROL SULFATE (2.5 MG/3ML) 0.083% IN NEBU
2.5000 mg | INHALATION_SOLUTION | Freq: Once | RESPIRATORY_TRACT | Status: AC
  Administered 2023-11-10: 2.5 mg via RESPIRATORY_TRACT
  Filled 2023-11-10: qty 3

## 2023-11-10 MED ORDER — SODIUM CHLORIDE 0.9 % IV BOLUS
1000.0000 mL | Freq: Once | INTRAVENOUS | Status: AC
  Administered 2023-11-10: 1000 mL via INTRAVENOUS

## 2023-11-10 MED ORDER — CEFTRIAXONE SODIUM 2 G IJ SOLR
2.0000 g | INTRAMUSCULAR | Status: DC
  Administered 2023-11-10 – 2023-11-12 (×3): 2 g via INTRAVENOUS
  Filled 2023-11-10 (×3): qty 20

## 2023-11-10 MED ORDER — BENZONATATE 100 MG PO CAPS
100.0000 mg | ORAL_CAPSULE | Freq: Once | ORAL | Status: AC
  Administered 2023-11-10: 100 mg via ORAL
  Filled 2023-11-10: qty 1

## 2023-11-10 NOTE — ED Provider Notes (Signed)
[place] EMERGENCY DEPARTMENT AT [facility] Provider Note   CSN: [id number] Arrival date & time: 11/10/23  1227     History  Chief Complaint  Patient presents with   Shortness of Breath    Laura Wheeler is a 62 y.o. female.  The history is provided by the patient and medical records. No language interpreter was used.  Shortness of Breath    62 year old female significant history of Wolff-Parkinson-White, recurrent bronchitis, allergic rhinitis presenting to the ED with complaint of shortness of breath.  Patient is for the past 10 days she has been feeling bad with cold symptoms.  She mention initially she developed some throat irritation with hoarseness.  It has progressed to more of a cough.  Now she endorsed generalized fatigue and shortness of breath with persistent cough.  She mention she was seen evaluate by her PCP office as well as through urgent care multiple times during these episodes.  She was giving supportive care which includes prednisone, cough medication, throat medication without adequate relief.  She has had testing including COVID and flu several days ago which came back negative.  She also mention having x-ray of the chest done several days ago without any signs of pneumonia.  She has been traveling for her son's wedding to [place] including flying but her symptoms started prior to the travels.  She never had any history of PE or DVT in the past no leg swelling or calf pain.  Home Medications Prior to Admission medications   Medication Sig Start Date End Date Taking? Authorizing Provider  Azelastine-Fluticasone 137-50 MCG/ACT SUSP Place 1 spray into both nostrils 2 (two) times daily. 07/26/20   [provider]  chlorpheniramine (CHLOR-TRIMETON) 4 MG tablet Take 4 mg by mouth 2 (two) times daily as needed for allergies.    [provider]  [facility] ASPIRIN ADULT LOW DOSE 81 MG chewable tablet CHEW 1 TABLET (81 MG TOTAL) BY MOUTH 2 (TWO)  TIMES DAILY. 03/19/22   Kathryne Hitch, MD  EPINEPHrine 0.3 mg/0.3 mL IJ SOAJ injection Inject 0.3 mg into the muscle as needed for anaphylaxis. 12/31/21   [provider]  levocetirizine (XYZAL) 5 MG tablet Take 5 mg by mouth at bedtime. 09/12/20   [provider]  montelukast (SINGULAIR) 10 MG tablet TAKE 1 TABLET BY MOUTH EVERYDAY AT BEDTIME 09/16/23   Olalere, Adewale A, MD  Multiple Vitamin (MULTIVITAMINS PO) Take 1 Package by mouth daily. [place]  [facility]ita pack    [provider]  mupirocin ointment (BACTROBAN) 2 % Apply topically 3 (three) times daily. 02/02/23   [provider]  OVER THE COUNTER MEDICATION Take 2 tablets by mouth daily. [place]  [facility]    [provider]  OVER THE COUNTER MEDICATION Take 1 tablet by mouth daily as needed (bronchitis). [place]   supplement for vitamin C    [provider]  OVER THE COUNTER MEDICATION Take 5 tablets by mouth daily. [place] Osteomatrix    [provider]  pantoprazole (PROTONIX) 40 MG tablet TAKE 1 TABLET BY MOUTH EVERY DAY 07/12/23   Burchette, Elberta Fortis, MD  Protein POWD Take 2 Scoops by mouth daily. [place] life shake Add collagen    [provider]  traMADol (ULTRAM) 50 MG tablet Take 2 tablets (100 mg total) by mouth every 6 (six) hours as needed. 08/31/22   Kathryne Hitch, MD  TURMERIC PO Take 1 tablet by mouth daily. [place]    [provider]  valACYclovir (VALTREX) 500 MG tablet Take 500 mg by mouth daily as needed (Fever blister). 02/11/22   [provider]  Monte Fantasia INHUB 250-50 MCG/ACT AEPB INHALE 1 PUFF INTO THE LUNGS IN THE MORNING AND AT BEDTIME. 06/29/23   Olalere, Adewale A, MD      Allergies    Dog epithelium (canis lupus familiaris), Horse epithelium, Oxycodone, and Sulfa antibiotics    Review of Systems   Review of Systems  Respiratory:  Positive for shortness of breath.   All other systems reviewed and are  negative.   Physical Exam Updated Vital Signs BP 136/73 (BP Location: Right Arm)   Pulse 87   Temp (!) 100.4 F (38 C) (Oral)   Resp (!) 21   LMP 09/20/2014   SpO2 94%  Physical Exam Vitals and nursing note reviewed.  Constitutional:      General: She is not in acute distress.    Appearance: She is well-developed. She is obese.  HENT:     Head: Atraumatic.     Mouth/Throat:     Comments: Throat: Uvula midline posterior oropharyngeal erythema noted no tonsillar enlargement or exudate no stridor or trismus.  Voice is hoarse Eyes:     Conjunctiva/sclera: Conjunctivae normal.  Neck:     Vascular: No carotid bruit.  Cardiovascular:     Rate and Rhythm: Normal rate and regular rhythm.     Pulses: Normal pulses.     Heart sounds: Normal heart sounds.  Pulmonary:     Effort: Pulmonary effort is normal.     Breath sounds: No wheezing, rhonchi or rales.  Abdominal:     Palpations: Abdomen is soft.     Tenderness: There is no abdominal tenderness.  Musculoskeletal:     Cervical back: Normal range of motion and neck supple. No rigidity or tenderness.     Right lower leg: No edema.     Left lower leg: No edema.  Lymphadenopathy:     Cervical: No cervical adenopathy.  Skin:    Findings: No rash.  Neurological:     Mental Status: She is alert.  Psychiatric:        Mood and Affect: Mood normal.     ED Results / Procedures / Treatments   Labs (all labs ordered are listed, but only abnormal results are displayed) Labs Reviewed  CBC WITH DIFFERENTIAL/PLATELET - Abnormal; Notable for the following components:      Result Value   WBC 17.9 (*)    HCT 35.4 (*)    Neutro Abs 13.4 (*)    Monocytes Absolute 1.4 (*)    Abs Immature Granulocytes 0.10 (*)    All other components within normal limits  GROUP A STREP BY PCR  RESP PANEL BY RT-PCR (RSV, FLU A&B, COVID)  RVPGX2  RESP PANEL BY RT-PCR (RSV, FLU A&B, COVID)  RVPGX2  BASIC METABOLIC PANEL    EKG EKG  Interpretation Date/Time:  Wednesday November 10 2023 13:00:49 EST Ventricular Rate:  88 PR Interval:  175 QRS Duration:  77 QT Interval:  337 QTC Calculation: 408 R Axis:   60  Text Interpretation: Sinus rhythm No significant change since last tracing Confirmed by Gwyneth Sprout ([id number]) on 11/10/2023 5:16:03 PM  Radiology DG Chest 2 View  Result Date: 11/10/2023 CLINICAL DATA:  Shortness of breath. EXAM: CHEST - 2 VIEW COMPARISON:  07/22/2022 FINDINGS: Unchanged cardiac silhouette and mediastinal contours. Interval development of trace bilateral pleural effusions. No discrete focal airspace opacities. No evidence of edema.  No pneumothorax. No acute osseous abnormalities. IMPRESSION: Interval development of trace bilateral pleural effusions. Electronically Signed   By: Simonne Come M.D.   On: 11/10/2023 16:47    Procedures Procedures    Medications Ordered in ED Medications  albuterol (VENTOLIN HFA) 108 (90 Base) MCG/ACT inhaler 2 puff (has no administration in time range)  lidocaine (XYLOCAINE) 2 % viscous mouth solution 15 mL (15 mLs Mouth/Throat Given 11/10/23 1340)  benzonatate (TESSALON) capsule 100 mg (100 mg Oral Given 11/10/23 1340)  acetaminophen (TYLENOL) tablet 1,000 mg (1,000 mg Oral Given 11/10/23 1345)  albuterol (PROVENTIL) (2.5 MG/3ML) 0.083% nebulizer solution 2.5 mg (2.5 mg Nebulization Given 11/10/23 1355)  iohexol (OMNIPAQUE) 350 MG/ML injection 100 mL (75 mLs Intravenous Contrast Given 11/10/23 1814)    ED Course/ Medical Decision Making/ A&P                                 Medical Decision Making Amount and/or Complexity of Data Reviewed Labs: ordered. Radiology: ordered.  Risk OTC drugs. Prescription drug management.   BP 136/73 (BP Location: Right Arm)   Pulse 87   Temp (!) 100.4 F (38 C) (Oral)   Resp (!) 21   LMP 09/20/2014   SpO2 94%   37:36 PM  62 year old female significant history of Wolff-Parkinson-White, recurrent bronchitis,  allergic rhinitis presenting to the ED with complaint of shortness of breath.  Patient is for the past 10 days she has been feeling bad with cold symptoms.  She mention initially she developed some throat irritation with hoarseness.  It has progressed to more of a cough.  Now she endorsed generalized fatigue and shortness of breath with persistent cough.  She mention she was seen evaluate by her PCP office as well as through urgent care multiple times during these episodes.  She was giving supportive care which includes prednisone, cough medication, throat medication without adequate relief.  She has had testing including COVID and flu several days ago which came back negative.  She also mention having x-ray of the chest done several days ago without any signs of pneumonia.  She has been traveling for her son's wedding to [place] including flying but her symptoms started prior to the travels.  She never had any history of PE or DVT in the past no leg swelling or calf pain.  Exam remarkable for hoarseness with some posterior oropharyngeal erythema of the throat.  Skin is warm to the touch with an oral temperature of 100.4.  No significant wheezes rales or rhonchi heard.  Abdomen is soft nontender no peripheral edema.  -Labs ordered, independently viewed and interpreted by me.  Labs remarkable for WBC 17.9, likely 2/2 recent prednisone use. Strep test negative, covid/flu/rsv negative -The patient was maintained on a cardiac monitor.  I personally viewed and interpreted the cardiac monitored which showed an underlying rhythm of: sinus tachycardia -Imaging independently viewed and interpreted by me and I agree with radiologist's interpretation.  Result remarkable for CXR showing interval development of trace bilateral pleural effusions.  -This patient presents to the ED for concern of sob, this involves an extensive number of treatment options, and is a complaint that carries with it a high risk of  complications and morbidity.  The differential diagnosis includes viral illness, pneumonia, PE, ptx, chf -Co morbidities that complicate the patient evaluation includes bronchitis, svt, wpw -Treatment includes albuterol nebs, tylenol -Reevaluation of the patient after these medicines showed  that the patient improved -PCP office notes or outside notes reviewed -Discussion with oncoming team who will f/u on CT result, and reassess pt and determine disposition -Escalation to admission/observation considered: dispo pending         Final Clinical Impression(s) / ED Diagnoses Final diagnoses:  None    Rx / DC Orders ED Discharge Orders     None         Fayrene Helper, PA-C 11/10/23 1853    Margarita Grizzle, MD 11/11/23 1154

## 2023-11-10 NOTE — ED Notes (Signed)
RT Note: Upon assessing the patient, she stated she had a couple of breathing treatments in [place] and she didn't see where they helped much at all. We agreed that we would wait to see if she changed her mind and wanted a breathing treatment.

## 2023-11-10 NOTE — ED Provider Notes (Signed)
Handoff from B. Laveda Norman PA. Having URI sx for several days. Tachycardic and 90% when ambulating with SOB. If CTA normal, reassess and ambulate.   Physical Exam  BP (!) 102/53   Pulse 90   Temp 98.6 F (37 C) (Oral)   Resp 15   LMP 09/20/2014   SpO2 96%   Physical Exam Vitals and nursing note reviewed.  Constitutional:      General: She is not in acute distress.    Appearance: She is well-developed.  HENT:     Head: Normocephalic and atraumatic.  Eyes:     Conjunctiva/sclera: Conjunctivae normal.  Cardiovascular:     Rate and Rhythm: Normal rate and regular rhythm.     Heart sounds: No murmur heard. Pulmonary:     Effort: Pulmonary effort is normal. No respiratory distress.     Breath sounds: Normal breath sounds.  Abdominal:     Palpations: Abdomen is soft.     Tenderness: There is no abdominal tenderness.  Musculoskeletal:        General: No swelling.     Cervical back: Neck supple.  Skin:    General: Skin is warm and dry.     Capillary Refill: Capillary refill takes less than 2 seconds.  Neurological:     Mental Status: She is alert.  Psychiatric:        Mood and Affect: Mood normal.     Procedures  Procedures  ED Course / MDM    Medical Decision Making Patient found to have postobstructive pneumonia and mucous/bronchial plugging.  Is hypoxic, when ambulating, and is 90% on room air, when sitting.  She is febrile, and tachycardic, and tachypneic.  I discussed with Dr. Loney Loh, hospitalist, and she accepts admission, after reviewing lactic acid of 1.4.  Patient requires admission, secondary to pneumonia, and unwell set status.  Meets SIRS criteria.  Antibiotics started.  Amount and/or Complexity of Data Reviewed Labs: ordered. Radiology: ordered.  Risk OTC drugs. Prescription drug management. Decision regarding hospitalization.         Pete Pelt, [place] 11/10/23 1610    Gwyneth Sprout, MD 11/15/23 1205

## 2023-11-10 NOTE — ED Notes (Signed)
RT Note: Patient is wanting a breathing treatment. An Albuterol treatment is ordered.

## 2023-11-10 NOTE — ED Notes (Addendum)
RT Note: Patient oxygen saturation on room air at rest: 94%, HR 90, RR 16 Patient oxygen saturation on room air while ambulating 90%, HR 117, RR 22

## 2023-11-10 NOTE — ED Triage Notes (Addendum)
Pt requesting RT eval. See MAR.

## 2023-11-10 NOTE — ED Notes (Signed)
90% on RA. Placed on 2LPM via [place]. Titrated up to 4LPM to maintain 94%.

## 2023-11-11 ENCOUNTER — Encounter (HOSPITAL_COMMUNITY): Payer: Self-pay | Admitting: Internal Medicine

## 2023-11-11 DIAGNOSIS — J45909 Unspecified asthma, uncomplicated: Secondary | ICD-10-CM | POA: Diagnosis present

## 2023-11-11 DIAGNOSIS — T17500A Unspecified foreign body in bronchus causing asphyxiation, initial encounter: Secondary | ICD-10-CM | POA: Diagnosis not present

## 2023-11-11 DIAGNOSIS — Z96651 Presence of right artificial knee joint: Secondary | ICD-10-CM | POA: Diagnosis present

## 2023-11-11 DIAGNOSIS — Z8261 Family history of arthritis: Secondary | ICD-10-CM | POA: Diagnosis not present

## 2023-11-11 DIAGNOSIS — Z79899 Other long term (current) drug therapy: Secondary | ICD-10-CM | POA: Diagnosis not present

## 2023-11-11 DIAGNOSIS — J189 Pneumonia, unspecified organism: Secondary | ICD-10-CM | POA: Diagnosis present

## 2023-11-11 DIAGNOSIS — Z83438 Family history of other disorder of lipoprotein metabolism and other lipidemia: Secondary | ICD-10-CM | POA: Diagnosis not present

## 2023-11-11 DIAGNOSIS — R6520 Severe sepsis without septic shock: Secondary | ICD-10-CM | POA: Diagnosis present

## 2023-11-11 DIAGNOSIS — Z1152 Encounter for screening for COVID-19: Secondary | ICD-10-CM | POA: Diagnosis not present

## 2023-11-11 DIAGNOSIS — J9601 Acute respiratory failure with hypoxia: Secondary | ICD-10-CM | POA: Diagnosis present

## 2023-11-11 DIAGNOSIS — Z888 Allergy status to other drugs, medicaments and biological substances status: Secondary | ICD-10-CM | POA: Diagnosis not present

## 2023-11-11 DIAGNOSIS — Z882 Allergy status to sulfonamides status: Secondary | ICD-10-CM | POA: Diagnosis not present

## 2023-11-11 DIAGNOSIS — A419 Sepsis, unspecified organism: Secondary | ICD-10-CM | POA: Diagnosis present

## 2023-11-11 DIAGNOSIS — Z8249 Family history of ischemic heart disease and other diseases of the circulatory system: Secondary | ICD-10-CM | POA: Diagnosis not present

## 2023-11-11 DIAGNOSIS — Z885 Allergy status to narcotic agent status: Secondary | ICD-10-CM | POA: Diagnosis not present

## 2023-11-11 DIAGNOSIS — Z833 Family history of diabetes mellitus: Secondary | ICD-10-CM | POA: Diagnosis not present

## 2023-11-11 LAB — RESPIRATORY PANEL BY PCR

## 2023-11-11 MED ORDER — FLUTICASONE FUROATE-VILANTEROL 100-25 MCG/ACT IN AEPB
1.0000 | INHALATION_SPRAY | Freq: Every day | RESPIRATORY_TRACT | Status: DC
  Administered 2023-11-12 – 2023-11-13 (×2): 1 via RESPIRATORY_TRACT
  Filled 2023-11-11: qty 28

## 2023-11-11 MED ORDER — GUAIFENESIN 100 MG/5ML PO LIQD
5.0000 mL | ORAL | Status: DC | PRN
  Administered 2023-11-11 – 2023-11-12 (×2): 5 mL via ORAL
  Filled 2023-11-11 (×3): qty 10

## 2023-11-11 MED ORDER — METOCLOPRAMIDE HCL 5 MG/ML IJ SOLN
10.0000 mg | Freq: Once | INTRAMUSCULAR | Status: AC
  Administered 2023-11-11: 10 mg via INTRAVENOUS
  Filled 2023-11-11: qty 2

## 2023-11-11 MED ORDER — ACETAMINOPHEN 325 MG PO TABS
650.0000 mg | ORAL_TABLET | Freq: Four times a day (QID) | ORAL | Status: DC | PRN
  Administered 2023-11-11 – 2023-11-13 (×7): 650 mg via ORAL
  Filled 2023-11-11 (×7): qty 2

## 2023-11-11 MED ORDER — SODIUM CHLORIDE 0.9 % IV BOLUS
1000.0000 mL | Freq: Once | INTRAVENOUS | Status: AC
  Administered 2023-11-11: 1000 mL via INTRAVENOUS

## 2023-11-11 MED ORDER — ACETAMINOPHEN 325 MG PO TABS
650.0000 mg | ORAL_TABLET | Freq: Once | ORAL | Status: AC | PRN
  Administered 2023-11-11: 650 mg via ORAL
  Filled 2023-11-11: qty 2

## 2023-11-11 MED ORDER — KETOROLAC TROMETHAMINE 15 MG/ML IJ SOLN
15.0000 mg | Freq: Once | INTRAMUSCULAR | Status: AC
  Administered 2023-11-11: 15 mg via INTRAVENOUS
  Filled 2023-11-11: qty 1

## 2023-11-11 MED ORDER — RIVAROXABAN 10 MG PO TABS
10.0000 mg | ORAL_TABLET | Freq: Every day | ORAL | Status: DC
  Filled 2023-11-11 (×2): qty 1

## 2023-11-11 MED ORDER — PHENOL 1.4 % MT LIQD
1.0000 | OROMUCOSAL | Status: DC | PRN
  Administered 2023-11-11: 1 via OROMUCOSAL
  Filled 2023-11-11: qty 177

## 2023-11-11 NOTE — ED Provider Notes (Signed)
Emergency Medicine Observation Re-evaluation Note  Laura Wheeler is a 62 y.o. female, seen on rounds today.  Pt initially presented to the ED for complaints of Shortness of Breath Currently, the patient is awake resting in bed, reports improvement on breathing in no acute distress.  Physical Exam  BP 101/64   Pulse 80   Temp 99 F (37.2 C) (Oral)   Resp 20   LMP 09/20/2014   SpO2 96%  Physical Exam General: Awake and alert, no acute distress Cardiac: Regular rate and rhythm Lungs: CTAB, on [place], no increased WOB Psych: calm, cooperative  ED Course / MDM  EKG:EKG Interpretation Date/Time:  Wednesday November 10 2023 13:00:49 EST Ventricular Rate:  88 PR Interval:  175 QRS Duration:  77 QT Interval:  337 QTC Calculation: 408 R Axis:   60  Text Interpretation: Sinus rhythm No significant change since last tracing Confirmed by Gwyneth Sprout ([id number]) on 11/10/2023 5:16:03 PM  I have reviewed the labs performed to date as well as medications administered while in observation.  Recent changes in the last 24 hours include found to have pneumonia, desatted and became tachycardic and tachypneic on ambulation.  Plan  Current plan is for continued antibiotics, pending inpatient admission.Theresia Lo, Cecile Sheerer, DO 11/11/23 [phone]

## 2023-11-11 NOTE — ED Notes (Signed)
Laura Wheeler with cl aware of transport

## 2023-11-11 NOTE — ED Notes (Signed)
Attempted to call report to IP RN, no answer. Will call back

## 2023-11-11 NOTE — Plan of Care (Signed)

## 2023-11-11 NOTE — H&P (Signed)
History and Physical    Patient: Laura Wheeler ZOX:[id number] DOB: 03-28-1961 DOA: 11/10/2023 DOS: the patient was seen and examined on 11/11/2023 PCP: Kristian Covey, MD  Patient coming from: Home  Chief Complaint:  Chief Complaint  Patient presents with   Shortness of Breath   HPI: Laura Wheeler is a 62 y.o. female with medical history significant of WPW status post ablation, asthma, recurrent bronchitis, allergic rhinitis presenting to the ED with shortness of breath and URI symptoms.  She reports that her shortness of breath began about 13 days ago.  She initially had hoarseness of her voice along with shortness of breath.  She thought it was worsening of her asthma and thus she used her home inhalers but these did not relieve her symptoms.  She noticed that her oxygen level on home pulse ox was staying around 90%.  She spoke with her primary care doctor who prescribed a course of prednisone for her.  She took about 2 doses but did not complete her therapy.  She continued to feel shortness of breath over the past week and has recently developed a cough productive of small amounts of yellowish sputum.  She denies any fevers or chills at home.  She denies any nausea, vomiting, chest pain, palpitations, abdominal pain, urinary symptoms, diarrhea, constipation.  She denies any known recent sick contacts.  At [facility], she was noted to have a fever of 100.25F on arrival and was tachycardic to the 110s.  She was hemodynamically stable.  She did desat to 90% on room air with ambulation and was subsequently placed on 2 L nasal cannula.  CBC showed a WBC count of 17.9.  COVID/influenza/RSV were all negative, group A strep PCR was negative, and lactate was normal.  CTA chest was negative for PE but showed mucus impaction of bilateral lower lobe bronchi with postobstructive pneumonia.  She was started on antibiotics for pneumonia coverage and received an albuterol nebulizer treatment.  She  was subsequently transferred to [facility] for admission.   Review of Systems: As mentioned in the history of present illness. All other systems reviewed and are negative. Past Medical History:  Diagnosis Date   Arthritis    "knees" (05/19/2013)   Bronchitis    History of cardiac radiofrequency ablation 04/20/2013   Hx of echocardiogram    Echo (12/15):  EF 60-65%, no RWMA, Gr 1 DD   SVT (supraventricular tachycardia) (HCC)    WPW (Wolff-Parkinson-White syndrome) ~ 1983   a. s/p ablation 05/20/13.   Past Surgical History:  Procedure Laterality Date   ANAL FISSURE REPAIR  1980's   BREAST BIOPSY Left 2000's   EXCISION MORTON'S NEUROMA Left 05/04/2013   Procedure: LEFT SECOND MT  WEIL/SECOND WEBB SPACE NEUROMA EXCISION ;  Surgeon: [name], MD;  Location: [facility] OR;  Service: Orthopedics;  Laterality: Left;   FOOT SURGERY Left 09/2014   hardware removal   SUPRAVENTRICULAR TACHYCARDIA ABLATION  05/19/2013   SUPRAVENTRICULAR TACHYCARDIA ABLATION N/A 05/19/2013   Procedure: SUPRAVENTRICULAR TACHYCARDIA ABLATION;  Surgeon: Marinus Maw, MD;  Location: [facility] CATH LAB;  Service: Cardiovascular;  Laterality: N/A;   TOTAL KNEE ARTHROPLASTY Right 03/10/2022   Procedure: Right TOTAL KNEE ARTHROPLASTY;  Surgeon: Kathryne Hitch, MD;  Location: [facility] OR;  Service: Orthopedics;  Laterality: Right;   Social History:  reports that she has never smoked. She has never used smokeless tobacco. She reports that she does not currently use alcohol. She reports that she does not use drugs.  Allergies  Allergen Reactions   Dog Epithelium (Canis Lupus Familiaris) Itching and Shortness Of Breath    Other reaction(s): Cough, Respiratory Distress   Horse Epithelium Shortness Of Breath    Other reaction(s): Cough, Respiratory Distress   Oxycodone Nausea Only   Sulfa Antibiotics Itching and Other (See Comments)    Doesn't remember reaction - 20 years ago    Family History  Problem Relation Age of Onset    Diabetes Mother    Depression Mother    Melanoma Mother    Hypothyroidism Mother    Hyperlipidemia Mother    Cancer Father        ?osteosarcoma leg   Hypertension Father    Diabetes Brother    Arthritis Brother    Heart attack Neg Hx     Prior to Admission medications   Medication Sig Start Date End Date Taking? Authorizing Provider  albuterol (VENTOLIN HFA) 108 (90 Base) MCG/ACT inhaler Inhale 1-2 puffs into the lungs every 4 (four) hours as needed. *every 4-6 hours* 09/06/23  Yes [provider]  Azelastine-Fluticasone 137-50 MCG/ACT SUSP Place 1 spray into both nostrils 2 (two) times daily. 07/26/20   [provider]  chlorpheniramine (CHLOR-TRIMETON) 4 MG tablet Take 4 mg by mouth 2 (two) times daily as needed for allergies.    [provider]  [facility] ASPIRIN ADULT LOW DOSE 81 MG chewable tablet CHEW 1 TABLET (81 MG TOTAL) BY MOUTH 2 (TWO) TIMES DAILY. 03/19/22   Kathryne Hitch, MD  EPINEPHrine 0.3 mg/0.3 mL IJ SOAJ injection Inject 0.3 mg into the muscle as needed for anaphylaxis. 12/31/21   [provider]  levocetirizine (XYZAL) 5 MG tablet Take 5 mg by mouth at bedtime. 09/12/20   [provider]  montelukast (SINGULAIR) 10 MG tablet TAKE 1 TABLET BY MOUTH EVERYDAY AT BEDTIME 09/16/23   Olalere, Adewale A, MD  Multiple Vitamin (MULTIVITAMINS PO) Take 1 Package by mouth daily. [place]  Vita pack    [provider]  mupirocin ointment (BACTROBAN) 2 % Apply topically 3 (three) times daily. 02/02/23   [provider]  OVER THE COUNTER MEDICATION Take 2 tablets by mouth daily. [place]  [facility]    [provider]  OVER THE COUNTER MEDICATION Take 1 tablet by mouth daily as needed (bronchitis). [place]   supplement for vitamin C    [provider]  OVER THE COUNTER MEDICATION Take 5 tablets by mouth daily. [place] Osteomatrix    [provider]  pantoprazole (PROTONIX) 40 MG tablet  TAKE 1 TABLET BY MOUTH EVERY DAY 07/12/23   Burchette, Elberta Fortis, MD  Protein POWD Take 2 Scoops by mouth daily. [place] life shake Add collagen    [provider]  traMADol (ULTRAM) 50 MG tablet Take 2 tablets (100 mg total) by mouth every 6 (six) hours as needed. 08/31/22   Kathryne Hitch, MD  TURMERIC PO Take 1 tablet by mouth daily. [place]    [provider]  valACYclovir (VALTREX) 500 MG tablet Take 500 mg by mouth daily as needed (Fever blister). 02/11/22   [provider]  Monte Fantasia INHUB 250-50 MCG/ACT AEPB INHALE 1 PUFF INTO THE LUNGS IN THE MORNING AND AT BEDTIME. 06/29/23   Tomma Lightning, MD    Physical Exam: Vitals:   11/11/23 0926 11/11/23 1234 11/11/23 1551 11/11/23 1700  BP: (!) 111/58 (!) 95/59 112/61   Pulse: 86 71 79   Resp: (!) 22 19 18    Temp: Marland Kitchen)  100.6 F (38.1 C) 99.1 F (37.3 C) 98.6 F (37 C)   TempSrc: Oral Oral Oral   SpO2: 96% 97% 98%   Weight:    67.6 kg  Height:    5\' 6"  (1.676 m)   Physical Exam Constitutional:      Appearance: She is well-developed. She is not ill-appearing.  HENT:     Head: Normocephalic and atraumatic.     Mouth/Throat:     Mouth: Mucous membranes are moist.     Pharynx: Oropharynx is clear. No oropharyngeal exudate.  Eyes:     Extraocular Movements: Extraocular movements intact.     Pupils: Pupils are equal, round, and reactive to light.  Cardiovascular:     Rate and Rhythm: Normal rate and regular rhythm.     Pulses: Normal pulses.     Heart sounds: Normal heart sounds. No murmur heard.    No friction rub. No gallop.  Pulmonary:     Effort: Pulmonary effort is normal. No tachypnea, accessory muscle usage or respiratory distress.     Breath sounds: No stridor. Examination of the right-lower field reveals decreased breath sounds. Examination of the left-lower field reveals decreased breath sounds. Decreased breath sounds present. No wheezing, rhonchi or rales.  Abdominal:     General: Bowel  sounds are normal.     Palpations: Abdomen is soft.     Tenderness: There is no abdominal tenderness. There is no guarding or rebound.  Musculoskeletal:     Right lower leg: No edema.  Skin:    General: Skin is warm and dry.  Neurological:     General: No focal deficit present.     Mental Status: She is alert and oriented to person, place, and time.  Psychiatric:        Mood and Affect: Mood normal.        Behavior: Behavior normal.     Data Reviewed:     Latest Ref Rng & Units 11/10/2023    4:15 PM 05/28/2023    8:37 AM 11/05/2022    1:52 PM  CBC  WBC 4.0 - 10.5 K/uL 17.9  6.7  10.7   Hemoglobin 12.0 - 15.0 g/dL 52.8  41.3  24.4   Hematocrit 36.0 - 46.0 % 35.4  42.5  37.0   Platelets 150 - 400 K/uL 388  389.0  362.0       Latest Ref Rng & Units 11/10/2023    4:15 PM 05/28/2023    8:37 AM 05/11/2022    9:21 AM  CMP  Glucose 70 - 99 mg/dL 90  87  92   BUN 8 - 23 mg/dL 8  14  18    Creatinine 0.44 - 1.00 mg/dL 0.10  2.72  5.36   Sodium 135 - 145 mmol/L 138  138  138   Potassium 3.5 - 5.1 mmol/L 3.5  4.2  4.4   Chloride 98 - 111 mmol/L 105  102  103   CO2 22 - 32 mmol/L 24  26  27    Calcium 8.9 - 10.3 mg/dL 9.1  9.8  9.7   Total Protein 6.0 - 8.3 g/dL  8.3  7.4   Total Bilirubin 0.2 - 1.2 mg/dL  0.6  0.4   Alkaline Phos 39 - 117 U/L  98  80   AST 0 - 37 U/L  20  15   ALT 0 - 35 U/L  18  15    CT Angio Chest PE W and/or Wo Contrast  Result Date: 11/10/2023 CLINICAL DATA:  Concern for pulmonary edema. EXAM: CT ANGIOGRAPHY CHEST WITH CONTRAST TECHNIQUE: Multidetector CT imaging of the chest was performed using the standard protocol during bolus administration of intravenous contrast. Multiplanar CT image reconstructions and MIPs were obtained to evaluate the vascular anatomy. RADIATION DOSE REDUCTION: This exam was performed according to the departmental dose-optimization program which includes automated exposure control, adjustment of the mA and/or kV according to patient  size and/or use of iterative reconstruction technique. CONTRAST:  75mL OMNIPAQUE IOHEXOL 350 MG/ML SOLN COMPARISON:  Chest radiograph dated 11/10/2023. FINDINGS: Cardiovascular: There is no cardiomegaly or pericardial effusion. Mild atherosclerotic calcification of the thoracic aorta. No aneurysmal dilatation or dissection. No pulmonary artery embolus identified. Mediastinum/Nodes: Mildly enlarged right hilar lymph node measures 15 mm. Subcarinal lymph node measures 12 mm. The esophagus is grossly unremarkable. No mediastinal fluid collection. Lungs/Pleura: Bilateral lower lobe patchy consolidation and nodularity, right greater than left, consistent with pneumonia. Aspiration is not excluded. There is mucous impaction and occlusion of the right lower lobe bronchi bilaterally. There is no pleural effusion or pneumothorax. Upper Abdomen: No acute abnormality. Musculoskeletal: No acute osseous pathology. Review of the MIP images confirms the above findings. IMPRESSION: 1. No CT evidence of pulmonary artery embolus. 2. Mucous impaction of the bilateral lower lobe bronchi with postobstructive pneumonia. Follow-up to resolution recommended. 3. Mildly enlarged right hilar and subcarinal lymph nodes, likely reactive. 4.  Aortic Atherosclerosis (ICD10-I70.0). Electronically Signed   By: Elgie Collard M.D.   On: 11/10/2023 21:02     Assessment and Plan: No notes have been filed under this hospital service. Service: Hospitalist  Post-obstructive Pneumonia 2/2 mucus impaction of bilateral lower lobe bronchi  History of asthma She did have several fevers earlier today with Tmax 102.69F. She is currently afebrile. BP has ranged from 90s-110s/60s, will provide additional IVF for rehydration. She is currently saturating 98% on 2L [place]. Imaging consistent with mucus plugging with post-obstructive PNA. Will attempt measures to dislodge mucus plug and treat PNA.  -Ceftriaxone and azithromycin for pneumonia coverage -Breo  Ellipta instead of home Wixela -Albuterol as needed -Guaifenesin as needed as mucolytic -1L NS for rehydration -Follow-up respiratory viral panel, expectorated sputum assessment with Gram stain and reflex to culture -f/u blood cultures -Chest PT every 4 hours -Flutter valve -Supplemental O2 to maintain O2 sats greater than 92%, wean to room air as able -PT eval and treat, ambulate with and without O2 -Consult to respiratory care treatment -Xarelto for DVT prophylaxis  WPW status post ablation Reports that her symptoms resolved and have not recurred since undergoing ablation for WPW.  She does not currently endorse any chest pain or palpitations. -Telemetry   Advance Care Planning:   Code Status: Full Code Confirmed with patient at bedside  Consults: none  Family Communication: none at bedside  Severity of Illness: The appropriate patient status for this patient is INPATIENT. Inpatient status is judged to be reasonable and necessary in order to provide the required intensity of service to ensure the patient's safety. The patient's presenting symptoms, physical exam findings, and initial radiographic and laboratory data in the context of their chronic comorbidities is felt to place them at high risk for further clinical deterioration. Furthermore, it is not anticipated that the patient will be medically stable for discharge from the hospital within 2 midnights of admission.   * I certify that at the point of admission it is my clinical judgment that the patient will require inpatient hospital care spanning beyond  2 midnights from the point of admission due to high intensity of service, high risk for further deterioration and high frequency of surveillance required.*  Portions of this note were generated with Dragon dictation software. Dictation errors may occur despite best attempts at proofreading.   Author: Briscoe Burns, MD 11/11/2023 5:52 PM  For on call review  [email or web address].

## 2023-11-12 DIAGNOSIS — J189 Pneumonia, unspecified organism: Secondary | ICD-10-CM | POA: Diagnosis not present

## 2023-11-12 LAB — CBC
HCT: 34.9 % — ABNORMAL LOW (ref 36.0–46.0)
Hemoglobin: 11.2 g/dL — ABNORMAL LOW (ref 12.0–15.0)
MCH: 30.2 pg (ref 26.0–34.0)
MCHC: 32.1 g/dL (ref 30.0–36.0)
MCV: 94.1 fL (ref 80.0–100.0)
Platelets: 404 10*3/uL — ABNORMAL HIGH (ref 150–400)
RBC: 3.71 MIL/uL — ABNORMAL LOW (ref 3.87–5.11)
RDW: 13.7 % (ref 11.5–15.5)
WBC: 12.7 10*3/uL — ABNORMAL HIGH (ref 4.0–10.5)
nRBC: 0 % (ref 0.0–0.2)

## 2023-11-12 LAB — BASIC METABOLIC PANEL
Anion gap: 8 (ref 5–15)
BUN: 7 mg/dL — ABNORMAL LOW (ref 8–23)
CO2: 25 mmol/L (ref 22–32)
Calcium: 8.6 mg/dL — ABNORMAL LOW (ref 8.9–10.3)
Chloride: 107 mmol/L (ref 98–111)
Creatinine, Ser: 0.54 mg/dL (ref 0.44–1.00)
GFR, Estimated: >60 mL/min (ref >60)
Glucose, Bld: 97 mg/dL (ref 70–99)
Potassium: 3.6 mmol/L (ref 3.5–5.1)
Sodium: 140 mmol/L (ref 135–145)

## 2023-11-12 LAB — PROCALCITONIN: Procalcitonin: <0.1 ng/mL

## 2023-11-12 MED ORDER — DM-GUAIFENESIN ER 30-600 MG PO TB12
1.0000 | ORAL_TABLET | Freq: Two times a day (BID) | ORAL | Status: DC
Start: 2023-11-12 — End: 2023-11-13
  Administered 2023-11-12 – 2023-11-13 (×3): 1 via ORAL
  Filled 2023-11-12 (×3): qty 1

## 2023-11-12 MED ORDER — MONTELUKAST SODIUM 10 MG PO TABS
10.0000 mg | ORAL_TABLET | Freq: Every day | ORAL | Status: DC
  Administered 2023-11-12: 10 mg via ORAL
  Filled 2023-11-12: qty 1

## 2023-11-12 MED ORDER — CETIRIZINE HCL 10 MG PO TABS
10.0000 mg | ORAL_TABLET | Freq: Every day | ORAL | Status: DC
  Administered 2023-11-12: 10 mg via ORAL
  Filled 2023-11-12: qty 1

## 2023-11-12 MED ORDER — IPRATROPIUM-ALBUTEROL 0.5-2.5 (3) MG/3ML IN SOLN
3.0000 mL | Freq: Two times a day (BID) | RESPIRATORY_TRACT | Status: DC
  Administered 2023-11-12 – 2023-11-13 (×4): 3 mL via RESPIRATORY_TRACT
  Filled 2023-11-12 (×4): qty 3

## 2023-11-12 MED ORDER — AZITHROMYCIN 500 MG PO TABS
500.0000 mg | ORAL_TABLET | Freq: Every day | ORAL | Status: DC
  Administered 2023-11-12: 500 mg via ORAL
  Filled 2023-11-12: qty 1

## 2023-11-12 NOTE — TOC Initial Note (Signed)
Transition of Care [facility]) - Initial/Assessment Note    Patient Details  Name: Laura Wheeler MRN: [id number] Date of Birth: July 08, 1961  [facility] CM/SW Contact:    Howell Rucks, RN Phone Number: 11/12/2023, 12:05 PM  Clinical Narrative:   Met with pt at bedside to introduce role of TOC/NCM and review for dc planning. Pt reports she has an established PCP and pharmacy, no current home care services, reports she has a walker from knee surgery in the past, reports she feels safe returning home with support from spouse, confirmed transportation available at discharge. PT eval, await recommendation. TOC will continue to follow.                Expected Discharge Plan: Home w Home Health Services Barriers to Discharge: Continued Medical Work up   Patient Goals and CMS Choice Patient states their goals for this hospitalization and ongoing recovery are:: return home with support from spouse/family          Expected Discharge Plan and Services       Living arrangements for the past 2 months: Single Family Home                                      Prior Living Arrangements/Services Living arrangements for the past 2 months: Single Family Home Lives with:: Siblings Patient language and need for interpreter reviewed:: Yes Do you feel safe going back to the place where you live?: Yes      Need for Family Participation in Patient Care: Yes (Comment) Care giver support system in place?: Yes (comment) Current home services: DME (walker) Criminal Activity/Legal Involvement Pertinent to Current Situation/Hospitalization: No - Comment as needed  Activities of Daily Living   ADL Screening (condition at time of admission) Independently performs ADLs?: Yes (appropriate for developmental age) Is the patient deaf or have difficulty hearing?: No Does the patient have difficulty seeing, even when wearing glasses/contacts?: No Does the patient have difficulty  concentrating, remembering, or making decisions?: No  Permission Sought/Granted                  Emotional Assessment Appearance:: Appears stated age Attitude/Demeanor/Rapport: Gracious Affect (typically observed): Accepting Orientation: : Oriented to Self, Oriented to Place, Oriented to  Time, Oriented to Situation Alcohol / Substance Use: Not Applicable Psych Involvement: No (comment)  Admission diagnosis:  Pneumonia [J18.9] Obstructive pneumonia [J18.9] Mucus plugging of bronchi [T17.500A] Patient Active Problem List   Diagnosis Date Noted   Mucus plugging of bronchi 11/11/2023   Obstructive pneumonia 11/11/2023   Pneumonia 11/10/2023   Arthrofibrosis of knee joint, right 04/20/2022   Unilateral primary osteoarthritis, right knee 03/10/2022   DJD (degenerative joint disease) 03/10/2022   Status post total right knee replacement 03/10/2022   Primary osteoarthritis of left knee 10/20/2019   Nephrolithiasis 01/15/2016   Palpitations 01/15/2015   Plantar plate injury 72/53/6644   Neuropathic pain of left foot 09/12/2013   WOLFF (WOLFE)-PARKINSON-WHITE (WPW) SYNDROME 09/30/2009   Allergic rhinitis 09/30/2009   PCP:  Kristian Covey, MD Pharmacy:   [id number] - [place],  - [address]  [place] [address] Phone: [phone] Fax: [phone]     Social Determinants of Health (SDOH) Social History: SDOH Screenings   Food Insecurity: No Food Insecurity (11/11/2023)  Housing: Low Risk  (11/11/2023)  Transportation Needs: No Transportation Needs (11/03/2023)  Utilities: Not At  Risk (11/11/2023)  Alcohol Screen: Low Risk  (11/03/2023)  Depression (PHQ2-9): Low Risk  (05/28/2023)  Financial Resource Strain: Low Risk  (11/03/2023)  Physical Activity: Sufficiently Active (11/03/2023)  Social Connections: Socially Integrated (11/03/2023)  Stress: No Stress Concern Present (11/03/2023)  Tobacco Use: Low Risk  (11/11/2023)   SDOH  Interventions: Food Insecurity Interventions: Intervention Not Indicated Housing Interventions: Intervention Not Indicated Transportation Interventions: Intervention Not Indicated Utilities Interventions: Intervention Not Indicated   Readmission Risk Interventions    11/12/2023   12:04 PM  Readmission Risk Prevention Plan  Post Dischage Appt Complete  Medication Screening Complete  Transportation Screening Complete

## 2023-11-12 NOTE — Progress Notes (Signed)
PROGRESS NOTE    Laura Wheeler  MWN:[id number] DOB: Feb 06, 1961 DOA: 11/10/2023 PCP: Kristian Covey, MD   Brief Narrative:  HPI: Laura Wheeler is a 62 y.o. female with medical history significant of WPW status post ablation, asthma, recurrent bronchitis, allergic rhinitis presenting to the ED with shortness of breath and URI symptoms.   She reports that her shortness of breath began about 13 days ago.  She initially had hoarseness of her voice along with shortness of breath.  She thought it was worsening of her asthma and thus she used her home inhalers but these did not relieve her symptoms.  She noticed that her oxygen level on home pulse ox was staying around 90%.  She spoke with her primary care doctor who prescribed a course of prednisone for her.  She took about 2 doses but did not complete her therapy.  She continued to feel shortness of breath over the past week and has recently developed a cough productive of small amounts of yellowish sputum.  She denies any fevers or chills at home.  She denies any nausea, vomiting, chest pain, palpitations, abdominal pain, urinary symptoms, diarrhea, constipation.  She denies any known recent sick contacts.   At [facility], she was noted to have a fever of 100.38F on arrival and was tachycardic to the 110s.  She was hemodynamically stable.  She did desat to 90% on room air with ambulation and was subsequently placed on 2 L nasal cannula.  CBC showed a WBC count of 17.9.  COVID/influenza/RSV were all negative, group A strep PCR was negative, and lactate was normal.  CTA chest was negative for PE but showed mucus impaction of bilateral lower lobe bronchi with postobstructive pneumonia.  She was started on antibiotics for pneumonia coverage and received an albuterol nebulizer treatment.  She was subsequently transferred to [facility] for admission.  Assessment & Plan:   Principal Problem:   Obstructive pneumonia Active Problems:   Pneumonia    Mucus plugging of bronchi  Severe sepsis and acute hypoxic respiratory failure secondary to bilateral postobstructive pneumonia, POA: Patient met criteria for severe sepsis based on fever, tachypnea, leukocytosis and acute hypoxia.  Pneumonia confirmed on CT chest.  Currently on 2 L oxygen.  Feels slightly better than yesterday.  COVID, RSV and influenza negative.  RVP negative as well.  Checking procalcitonin.  Continue Rocephin and Zithromax. Continue chest PT and incentive serology.  Add Mucinex.  WPW syndrome status post ablation: Currently asymptomatic.  Monitor on telemetry.  DVT prophylaxis: rivaroxaban (XARELTO) tablet 10 mg Start: 11/11/23 1800   Code Status: Full Code  Family Communication:  None present at bedside.  Plan of care discussed with patient in length and he/she verbalized understanding and agreed with it.  Status is: Inpatient Remains inpatient appropriate because: Still symptomatic and hypoxic.  Potential discharge in 1 to 2 days.   Estimated body mass index is 24.23 kg/m as calculated from the following:   Height as of this encounter: 5\' 6"  (1.676 m).   Weight as of this encounter: 68.1 kg.    Nutritional Assessment: Body mass index is 24.23 kg/m.Marland Kitchen Seen by dietician.  I agree with the assessment and plan as outlined below: Nutrition Status:        . Skin Assessment: I have examined the patient's skin and I agree with the wound assessment as performed by the wound care RN as outlined below:    Consultants:  None  Procedures:  None  Antimicrobials:  Anti-infectives (From admission, onward)    Start     Dose/Rate Route Frequency Ordered Stop   11/10/23 2115  cefTRIAXone (ROCEPHIN) 2 g in sodium chloride 0.9 % 100 mL IVPB        2 g 200 mL/hr over 30 Minutes Intravenous Every 24 hours 11/10/23 2107 11/15/23 2159   11/10/23 2115  azithromycin (ZITHROMAX) 500 mg in sodium chloride 0.9 % 250 mL IVPB        500 mg 250 mL/hr over 60 Minutes Intravenous  Every 24 hours 11/10/23 2107 11/15/23 2159         Subjective: Patient seen and examined.  She states that her breathing is better than yesterday.  She is coughing with activity but otherwise not.  No other complaint.  Objective: Vitals:   11/11/23 2114 11/12/23 0108 11/12/23 0500 11/12/23 0530  BP: 111/74 111/65  (!) 114/56  Pulse: 91 80  79  Resp: 20 16  18   Temp: 98.9 F (37.2 C) 98.6 F (37 C)  97.6 F (36.4 C)  TempSrc: Oral Oral  Oral  SpO2: 95% 94%  96%  Weight:   68.1 kg   Height:        Intake/Output Summary (Last 24 hours) at 11/12/2023 1610 Last data filed at 11/12/2023 0000 Gross per 24 hour  Intake 585.55 ml  Output --  Net 585.55 ml   Filed Weights   11/11/23 1700 11/12/23 0500  Weight: 67.6 kg 68.1 kg    Examination:  General exam: Appears calm and comfortable  Respiratory system: Clear to auscultation. Respiratory effort normal. Cardiovascular system: S1 & S2 heard, RRR. No JVD, murmurs, rubs, gallops or clicks. No pedal edema. Gastrointestinal system: Abdomen is nondistended, soft and nontender. No organomegaly or masses felt. Normal bowel sounds heard. Central nervous system: Alert and oriented. No focal neurological deficits. Extremities: Symmetric 5 x 5 power. Skin: No rashes, lesions or ulcers Psychiatry: Judgement and insight appear normal. Mood & affect appropriate.    Data Reviewed: I have personally reviewed following labs and imaging studies  CBC: Recent Labs  Lab 11/10/23 1615 11/12/23 0503  WBC 17.9* 12.7*  NEUTROABS 13.4*  --   HGB 12.0 11.2*  HCT 35.4* 34.9*  MCV 89.8 94.1  PLT 388 404*   Basic Metabolic Panel: Recent Labs  Lab 11/10/23 1615 11/12/23 0503  NA 138 140  K 3.5 3.6  CL 105 107  CO2 24 25  GLUCOSE 90 97  BUN 8 7*  CREATININE 0.53 0.54  CALCIUM 9.1 8.6*   GFR: Estimated Creatinine Clearance: 68.3 mL/min (by C-G formula based on SCr of 0.54 mg/dL). Liver Function Tests: No results for input(s):  "AST", "ALT", "ALKPHOS", "BILITOT", "PROT", "ALBUMIN" in the last 168 hours. No results for input(s): "LIPASE", "AMYLASE" in the last 168 hours. No results for input(s): "AMMONIA" in the last 168 hours. Coagulation Profile: No results for input(s): "INR", "PROTIME" in the last 168 hours. Cardiac Enzymes: No results for input(s): "CKTOTAL", "CKMB", "CKMBINDEX", "TROPONINI" in the last 168 hours. BNP (last 3 results) No results for input(s): "PROBNP" in the last 8760 hours. HbA1C: No results for input(s): "HGBA1C" in the last 72 hours. CBG: No results for input(s): "GLUCAP" in the last 168 hours. Lipid Profile: No results for input(s): "CHOL", "HDL", "LDLCALC", "TRIG", "CHOLHDL", "LDLDIRECT" in the last 72 hours. Thyroid Function Tests: No results for input(s): "TSH", "T4TOTAL", "FREET4", "T3FREE", "THYROIDAB" in the last 72 hours. Anemia Panel: No results for input(s): "VITAMINB12", "FOLATE", "FERRITIN", "TIBC", "IRON", "RETICCTPCT"  in the last 72 hours. Sepsis Labs: Recent Labs  Lab 11/10/23 2140  LATICACIDVEN 1.4    Recent Results (from the past 240 hour(s))  Group A Strep by PCR     Status: None   Collection Time: 11/10/23  1:43 PM   Specimen: Throat; Sterile Swab  Result Value Ref Range Status   Group A Strep by PCR NOT DETECTED NOT DETECTED Final    Comment: Performed at [facility], [address], [place], [place] [address]  Resp panel by RT-PCR (RSV, Flu A&B, Covid)     Status: None   Collection Time: 11/10/23  1:52 PM  Result Value Ref Range Status   SARS Coronavirus 2 by RT PCR NEGATIVE NEGATIVE Final    Comment: (NOTE) SARS-CoV-2 target nucleic acids are NOT DETECTED.  The SARS-CoV-2 RNA is generally detectable in upper respiratory specimens during the acute phase of infection. The lowest concentration of SARS-CoV-2 viral copies this assay can detect is 138 copies/mL. A negative result does not preclude SARS-Cov-2 infection and should not be  used as the sole basis for treatment or other patient management decisions. A negative result may occur with  improper specimen collection/handling, submission of specimen other than nasopharyngeal swab, presence of viral mutation(s) within the areas targeted by this assay, and inadequate number of viral copies(<138 copies/mL). A negative result must be combined with clinical observations, patient history, and epidemiological information. The expected result is Negative.  Fact Sheet for Patients:  BloggerCourse.com  Fact Sheet for Healthcare Providers:  SeriousBroker.it  This test is no t yet approved or cleared by the [place] FDA and  has been authorized for detection and/or diagnosis of SARS-CoV-2 by FDA under an Emergency Use Authorization (EUA). This EUA will remain  in effect (meaning this test can be used) for the duration of the COVID-19 declaration under Section 564(b)(1) of the Act, 21 U.S.C.section 360bbb-3(b)(1), unless the authorization is terminated  or revoked sooner.       Influenza A by PCR NEGATIVE NEGATIVE Final   Influenza B by PCR NEGATIVE NEGATIVE Final    Comment: (NOTE) The Xpert Xpress SARS-CoV-2/FLU/RSV plus assay is intended as an aid in the diagnosis of influenza from Nasopharyngeal swab specimens and should not be used as a sole basis for treatment. Nasal washings and aspirates are unacceptable for Xpert Xpress SARS-CoV-2/FLU/RSV testing.  Fact Sheet for Patients: BloggerCourse.com  Fact Sheet for Healthcare Providers: SeriousBroker.it  This test is not yet approved or cleared by the [place] FDA and has been authorized for detection and/or diagnosis of SARS-CoV-2 by FDA under an Emergency Use Authorization (EUA). This EUA will remain in effect (meaning this test can be used) for the duration of the COVID-19 declaration under Section  564(b)(1) of the Act, 21 U.S.C. section 360bbb-3(b)(1), unless the authorization is terminated or revoked.     Resp Syncytial Virus by PCR NEGATIVE NEGATIVE Final    Comment: (NOTE) Fact Sheet for Patients: BloggerCourse.com  Fact Sheet for Healthcare Providers: SeriousBroker.it  This test is not yet approved or cleared by the [place] FDA and has been authorized for detection and/or diagnosis of SARS-CoV-2 by FDA under an Emergency Use Authorization (EUA). This EUA will remain in effect (meaning this test can be used) for the duration of the COVID-19 declaration under Section 564(b)(1) of the Act, 21 U.S.C. section 360bbb-3(b)(1), unless the authorization is terminated or revoked.  Performed at [facility], [address], [place], [place] [address]   Blood culture (  routine x 2)     Status: None (Preliminary result)   Collection Time: 11/10/23  9:16 PM   Specimen: BLOOD RIGHT HAND  Result Value Ref Range Status   Specimen Description   Final    BLOOD RIGHT HAND Performed at [facility], [address], [place], [place] [address]    Special Requests   Final    BOTTLES DRAWN AEROBIC AND ANAEROBIC Blood Culture results may not be optimal due to an inadequate volume of blood received in culture bottles Performed at [facility], [address], [place], [place] [address]    Culture PENDING  Incomplete   Report Status PENDING  Incomplete  Blood culture (routine x 2)     Status: None (Preliminary result)   Collection Time: 11/10/23  9:31 PM   Specimen: BLOOD LEFT HAND  Result Value Ref Range Status   Specimen Description   Final    BLOOD LEFT HAND Performed at [facility], [address], [place], [place] [address]    Special Requests   Final    BOTTLES DRAWN AEROBIC AND ANAEROBIC Blood Culture results may not be optimal due to an inadequate volume of blood received  in culture bottles Performed at [facility], [address] [place], [place] [address]    Culture PENDING  Incomplete   Report Status PENDING  Incomplete  Respiratory (~20 pathogens) panel by PCR     Status: None   Collection Time: 11/11/23  5:33 PM   Specimen: Nasopharyngeal Swab; Respiratory  Result Value Ref Range Status   Adenovirus NOT DETECTED NOT DETECTED Final   Coronavirus 229E NOT DETECTED NOT DETECTED Final    Comment: (NOTE) The Coronavirus on the Respiratory Panel, DOES NOT test for the novel  Coronavirus (2019 nCoV)    Coronavirus HKU1 NOT DETECTED NOT DETECTED Final   Coronavirus NL63 NOT DETECTED NOT DETECTED Final   Coronavirus OC43 NOT DETECTED NOT DETECTED Final   Metapneumovirus NOT DETECTED NOT DETECTED Final   Rhinovirus / Enterovirus NOT DETECTED NOT DETECTED Final   Influenza A NOT DETECTED NOT DETECTED Final   Influenza B NOT DETECTED NOT DETECTED Final   Parainfluenza Virus 1 NOT DETECTED NOT DETECTED Final   Parainfluenza Virus 2 NOT DETECTED NOT DETECTED Final   Parainfluenza Virus 3 NOT DETECTED NOT DETECTED Final   Parainfluenza Virus 4 NOT DETECTED NOT DETECTED Final   Respiratory Syncytial Virus NOT DETECTED NOT DETECTED Final   Bordetella pertussis NOT DETECTED NOT DETECTED Final   Bordetella Parapertussis NOT DETECTED NOT DETECTED Final   Chlamydophila pneumoniae NOT DETECTED NOT DETECTED Final   Mycoplasma pneumoniae NOT DETECTED NOT DETECTED Final    Comment: Performed at [facility], [address], [place], [place] [address]     Radiology Studies: CT Angio Chest PE W and/or Wo Contrast  Result Date: 11/10/2023 CLINICAL DATA:  Concern for pulmonary edema. EXAM: CT ANGIOGRAPHY CHEST WITH CONTRAST TECHNIQUE: Multidetector CT imaging of the chest was performed using the standard protocol during bolus administration of intravenous contrast. Multiplanar CT image reconstructions and MIPs were obtained to evaluate the  vascular anatomy. RADIATION DOSE REDUCTION: This exam was performed according to the departmental dose-optimization program which includes automated exposure control, adjustment of the mA and/or kV according to patient size and/or use of iterative reconstruction technique. CONTRAST:  75mL OMNIPAQUE IOHEXOL 350 MG/ML SOLN COMPARISON:  Chest radiograph dated 11/10/2023. FINDINGS: Cardiovascular: There is no cardiomegaly or pericardial effusion. Mild atherosclerotic calcification  of the thoracic aorta. No aneurysmal dilatation or dissection. No pulmonary artery embolus identified. Mediastinum/Nodes: Mildly enlarged right hilar lymph node measures 15 mm. Subcarinal lymph node measures 12 mm. The esophagus is grossly unremarkable. No mediastinal fluid collection. Lungs/Pleura: Bilateral lower lobe patchy consolidation and nodularity, right greater than left, consistent with pneumonia. Aspiration is not excluded. There is mucous impaction and occlusion of the right lower lobe bronchi bilaterally. There is no pleural effusion or pneumothorax. Upper Abdomen: No acute abnormality. Musculoskeletal: No acute osseous pathology. Review of the MIP images confirms the above findings. IMPRESSION: 1. No CT evidence of pulmonary artery embolus. 2. Mucous impaction of the bilateral lower lobe bronchi with postobstructive pneumonia. Follow-up to resolution recommended. 3. Mildly enlarged right hilar and subcarinal lymph nodes, likely reactive. 4.  Aortic Atherosclerosis (ICD10-I70.0). Electronically Signed   By: Elgie Collard M.D.   On: 11/10/2023 21:02   DG Chest 2 View  Result Date: 11/10/2023 CLINICAL DATA:  Shortness of breath. EXAM: CHEST - 2 VIEW COMPARISON:  07/22/2022 FINDINGS: Unchanged cardiac silhouette and mediastinal contours. Interval development of trace bilateral pleural effusions. No discrete focal airspace opacities. No evidence of edema. No pneumothorax. No acute osseous abnormalities. IMPRESSION: Interval  development of trace bilateral pleural effusions. Electronically Signed   By: Simonne Come M.D.   On: 11/10/2023 16:47    Scheduled Meds:  fluticasone furoate-vilanterol  1 puff Inhalation Daily   rivaroxaban  10 mg Oral Daily   Continuous Infusions:  azithromycin Stopped (11/11/23 2306)   cefTRIAXone (ROCEPHIN)  IV Stopped (11/11/23 2147)     LOS: 1 day   Hughie Closs, MD [facility]  11/12/2023, 8:12 AM   *Please note that this is a verbal dictation therefore any spelling or grammatical errors are due to the "Dragon Medical One" system interpretation.  Please page via Amion and do not message via secure chat for urgent patient care matters. Secure chat can be used for non urgent patient care matters.  How to contact the [facility] Attending or Consulting provider 7A - 7P or covering provider during after hours 7P -7A, for this patient?  Check the care team in [facility] and look for a) attending/consulting TRH provider listed and b) the [facility] team listed. Page or secure chat 7A-7P. Log into [email or web address] and use [facility]'s universal password to access. If you do not have the password, please contact the hospital operator. Locate the [facility] provider you are looking for under [facility] and page to a number that you can be directly reached. If you still have difficulty reaching the provider, please page the [facility] (Director on Call) for the Hospitalists listed on amion for assistance.

## 2023-11-12 NOTE — Evaluation (Signed)
Physical Therapy Evaluation Patient Details Name: Laura Wheeler MRN: [id number] DOB: 09/08/1961 Today's Date: 11/12/2023  History of Present Illness  Pt is a 62 yo female admitted with SOB and PNA. PMH includes R TKR, SVT, WPW, ablation, bronchitis, OA, and asthma.  Clinical Impression  Pt presents with decreased mobility secondary to the above diagnosis. Pt reports fatigue and feeling "foggy" with mobility. Pt is independent with bed mobility, supervision with gait due dec. O2 sats with mobility and some signs of dec balance, but no LOB observed. Pt's O2 sats at rest 96% 2 liters, with gait 89-90% RA, 90-92% 3 liters with gait. Educated on pursed lip breathing however it did not seem to improve O2 levels during gait.  Pt would benefit from ambulation on the unit with staff and monitor O2 sats. Pt will continue to benefit from acute skilled PT to maximize mobility and independence until d/c home.       If plan is discharge home, recommend the following: Assistance with cooking/housework;Assist for transportation   Can travel by private vehicle        Equipment Recommendations None recommended by PT  Recommendations for Other Services       Functional Status Assessment Patient has had a recent decline in their functional status and demonstrates the ability to make significant improvements in function in a reasonable and predictable amount of time.     Precautions / Restrictions Precautions Precautions: Fall Restrictions Weight Bearing Restrictions: No      Mobility  Bed Mobility Overal bed mobility: Modified Independent                  Transfers Overall transfer level: Modified independent Equipment used: None                    Ambulation/Gait Ambulation/Gait assistance: Supervision Gait Distance (Feet): 200 Feet Assistive device: None Gait Pattern/deviations: Step-through pattern, Decreased stride length Gait velocity: decreased     General Gait  Details: Pt reports feeling "foggy". Pt wanted to hold the rails in the hall at the beginning of the walk. No LOB noted, but felt a little unstable with gait. Os sats 89-90% on RA with gait, 90-92% on 3 liters with gait. Educated on pursed lip breathing.  Stairs            Wheelchair Mobility     Tilt Bed    Modified Rankin (Stroke Patients Only)       Balance Overall balance assessment: Needs assistance Sitting-balance support: No upper extremity supported, Feet supported Sitting balance-Leahy Scale: Good     Standing balance support: No upper extremity supported, During functional activity Standing balance-Leahy Scale: Fair                               Pertinent Vitals/Pain Pain Assessment Pain Assessment: 0-10 Pain Score: 1  Pain Location: Headache Pain Descriptors / Indicators: Discomfort Pain Intervention(s): Limited activity within patient's tolerance, Monitored during session    Home Living Family/patient expects to be discharged to:: Private residence Living Arrangements: Spouse/significant other Available Help at Discharge: Family Type of Home: House Home Access: Stairs to enter Entrance Stairs-Rails: None Entrance Stairs-Number of Steps: 2-3   Home Layout: Two level;Able to live on main level with bedroom/bathroom Home Equipment: Rolling Walker (2 wheels);Cane - single point;BSC/3in1      Prior Function Prior Level of Function : Independent/Modified Independent  Mobility Comments: was doing pilates       Extremity/Trunk Assessment        Lower Extremity Assessment Lower Extremity Assessment: Overall WFL for tasks assessed    Cervical / Trunk Assessment Cervical / Trunk Assessment: Normal  Communication   Communication Communication: No apparent difficulties  Cognition Arousal: Alert Behavior During Therapy: WFL for tasks assessed/performed Overall Cognitive Status: Within Functional Limits for tasks  assessed                                          General Comments      Exercises     Assessment/Plan    PT Assessment Patient needs continued PT services  PT Problem List Decreased activity tolerance;Decreased mobility;Cardiopulmonary status limiting activity       PT Treatment Interventions Gait training;Functional mobility training;Therapeutic exercise;Balance training;Patient/family education;Stair training    PT Goals (Current goals can be found in the Care Plan section)  Acute Rehab PT Goals Patient Stated Goal: To feel better and return home. PT Goal Formulation: With patient Time For Goal Achievement: 11/26/23 Potential to Achieve Goals: Good    Frequency Min 1X/week     Co-evaluation               AM-PAC PT "6 Clicks" Mobility  Outcome Measure Help needed turning from your back to your side while in a flat bed without using bedrails?: None Help needed moving from lying on your back to sitting on the side of a flat bed without using bedrails?: None Help needed moving to and from a bed to a chair (including a wheelchair)?: A Little Help needed standing up from a chair using your arms (e.g., wheelchair or bedside chair)?: A Little Help needed to walk in hospital room?: A Little Help needed climbing 3-5 steps with a railing? : A Little 6 Click Score: 20    End of Session Equipment Utilized During Treatment: Gait belt Activity Tolerance: Patient tolerated treatment well Patient left: in chair;with family/visitor present Nurse Communication: Mobility status PT Visit Diagnosis: Difficulty in walking, not elsewhere classified (R26.2)    Time: 6578-4696 PT Time Calculation (min) (ACUTE ONLY): 29 min   Charges:   PT Evaluation $PT Eval Low Complexity: 1 Low PT Treatments $Gait Training: 8-22 mins PT General Charges $$ ACUTE PT VISIT: 1 Visit          Greggory Stallion 11/12/2023, 12:10 PM

## 2023-11-13 DIAGNOSIS — J9601 Acute respiratory failure with hypoxia: Secondary | ICD-10-CM | POA: Insufficient documentation

## 2023-11-13 DIAGNOSIS — J189 Pneumonia, unspecified organism: Secondary | ICD-10-CM | POA: Diagnosis not present

## 2023-11-13 LAB — CBC WITH DIFFERENTIAL/PLATELET
Abs Immature Granulocytes: 0.11 10*3/uL — ABNORMAL HIGH (ref 0.00–0.07)
Basophils Absolute: 0.1 10*3/uL (ref 0.0–0.1)
Basophils Relative: 1 %
Eosinophils Absolute: 0.3 10*3/uL (ref 0.0–0.5)
Eosinophils Relative: 3 %
HCT: 34.8 % — ABNORMAL LOW (ref 36.0–46.0)
Hemoglobin: 11.2 g/dL — ABNORMAL LOW (ref 12.0–15.0)
Immature Granulocytes: 1 %
Lymphocytes Relative: 24 %
Lymphs Abs: 2.6 10*3/uL (ref 0.7–4.0)
MCH: 30 pg (ref 26.0–34.0)
MCHC: 32.2 g/dL (ref 30.0–36.0)
MCV: 93.3 fL (ref 80.0–100.0)
Monocytes Absolute: 1 10*3/uL (ref 0.1–1.0)
Monocytes Relative: 10 %
Neutro Abs: 6.8 10*3/uL (ref 1.7–7.7)
Neutrophils Relative %: 61 %
Platelets: 443 10*3/uL — ABNORMAL HIGH (ref 150–400)
RBC: 3.73 MIL/uL — ABNORMAL LOW (ref 3.87–5.11)
RDW: 13.4 % (ref 11.5–15.5)
WBC: 10.9 10*3/uL — ABNORMAL HIGH (ref 4.0–10.5)
nRBC: 0 % (ref 0.0–0.2)

## 2023-11-13 MED ORDER — DM-GUAIFENESIN ER 30-600 MG PO TB12: 1.0000 | ORAL_TABLET | Freq: Two times a day (BID) | ORAL | 0 refills | Status: AC

## 2023-11-13 MED ORDER — PANTOPRAZOLE SODIUM 40 MG PO TBEC
40.0000 mg | DELAYED_RELEASE_TABLET | Freq: Every day | ORAL | Status: DC
  Administered 2023-11-13: 40 mg via ORAL
  Filled 2023-11-13: qty 1

## 2023-11-13 MED ORDER — AMOXICILLIN-POT CLAVULANATE 875-125 MG PO TABS: 1.0000 | ORAL_TABLET | Freq: Two times a day (BID) | ORAL | 0 refills | Status: AC

## 2023-11-13 NOTE — Progress Notes (Signed)
Mobility Specialist - Progress Note  Pre-mobility: 94% SPO2 During mobility: 91% SPO2 Post-mobility: 94% SPO2   11/13/23 1002  Mobility  Activity Ambulated independently in hallway  Level of Assistance Independent  Assistive Device None  Distance Ambulated (ft) 500 ft  Range of Motion/Exercises Active  Activity Response Tolerated well  Mobility Referral Yes  $Mobility charge 1 Mobility  Mobility Specialist Start Time (ACUTE ONLY) 0955  Mobility Specialist Stop Time (ACUTE ONLY) 1002  Mobility Specialist Time Calculation (min) (ACUTE ONLY) 7 min   Pt was found in bed and agreeable to ambulate. No complaints with session. At EOS returned to recliner chair with all needs met. Call bell in reach and RN notified.  Billey Chang Mobility Specialist

## 2023-11-13 NOTE — Discharge Summary (Signed)
Physician Discharge Summary  Laura Wheeler QVZ:[id number] DOB: 13-Oct-1961 DOA: 11/10/2023  PCP: Kristian Covey, MD  Admit date: 11/10/2023 Discharge date: 11/13/2023 30 Day Unplanned Readmission Risk Score    Flowsheet Row ED to Hosp-Admission (Current) from 11/10/2023 in [facility] 4TH FLOOR PROGRESSIVE CARE AND UROLOGY  30 Day Unplanned Readmission Risk Score (%) 11.48 Filed at 11/13/2023 0800       This score is the patient's risk of an unplanned readmission within 30 days of being discharged (0 -100%). The score is based on dignosis, age, lab data, medications, orders, and past utilization.   Low:  0-14.9   Medium: 15-21.9   High: 22-29.9   Extreme: 30 and above          Admitted From: Home Disposition: Home  Recommendations for Outpatient Follow-up:  Follow up with PCP in 1-2 weeks Please obtain BMP/CBC in one week Follow-up with your pulmonologist in 1 to 2 weeks. Please follow up with your PCP on the following pending results: Unresulted Labs (From admission, onward)     Start     Ordered   11/11/23 1712  Expectorated Sputum Assessment w Gram Stain, Rflx to Resp Cult  (COPD / Pneumonia / Cellulitis / Lower Extremity Wound)  Once,   R        11/11/23 1713              Home Health: None Equipment/Devices: None  Discharge Condition: Stable CODE STATUS: Full code Diet recommendation: Cardiac  Subjective: Seen and examined.  She says that she is feeling much better.  She is sitting in the recliner, comfortable on room air.  She is in agreement with the discharge today.  Brief/Interim Summary: Laura Wheeler is a 62 y.o. female with medical history significant of WPW status post ablation, asthma, recurrent bronchitis, allergic rhinitis presented to the ED with shortness of breath and URI symptoms for about 13 days.  She spoke with her primary care doctor who prescribed a course of prednisone for her.  She took about 2 doses but did not complete her  therapy.  She continued to feel shortness of breath over the past week and has recently developed a cough productive of small amounts of yellowish sputum.  She denied any fevers or chills at home.    At [facility] ED, she was noted to have a fever of 100.63F on arrival and was tachycardic to the 110s.  She was hemodynamically stable.  CBC showed a WBC count of 17.9.  COVID/influenza/RSV were all negative, group A strep PCR was negative, and lactate was normal.  CTA chest was negative for PE but showed mucus impaction of bilateral lower lobe bronchi with postobstructive pneumonia.  Started on antibiotics, admitted under hospital service.  Details below.   Severe sepsis and acute hypoxic respiratory failure secondary to bilateral postobstructive pneumonia, POA: Patient met criteria for severe sepsis based on fever, tachypnea, leukocytosis and acute hypoxia.  Pneumonia confirmed on CT chest. RVP negative as well.  Continued on Rocephin and Zithromax.  Improved significantly.  Currently on room air.  Stable for discharge.  Discharging on 7 days of Augmentin and cough medications.  Resume PTA albuterol inhaler.   WPW syndrome status post ablation: Currently asymptomatic.   Discharge plan was discussed with patient and/or family member and they verbalized understanding and agreed with it.  Discharge Diagnoses:  Principal Problem:   Obstructive pneumonia Active Problems:   Pneumonia   Mucus plugging of bronchi   Acute respiratory  failure with hypoxia [facility])    Discharge Instructions   Allergies as of 11/13/2023       Reactions   Dog Epithelium (canis Lupus Familiaris) Shortness Of Breath, Itching, Other (See Comments), Cough   Respiratory Distress   Horse Epithelium Shortness Of Breath, Other (See Comments), Cough   Respiratory Distress   Oxycodone Nausea Only   Sulfa Antibiotics Itching        Medication List     STOP taking these medications    [employer] Aspirin Adult Low Dose 81 MG  chewable tablet Generic drug: aspirin   traMADol 50 MG tablet Commonly known as: ULTRAM       TAKE these medications    albuterol 108 (90 Base) MCG/ACT inhaler Commonly known as: VENTOLIN HFA Inhale 2 puffs into the lungs every 4 (four) hours as needed for wheezing or shortness of breath.   amoxicillin-clavulanate 875-125 MG tablet Commonly known as: AUGMENTIN Take 1 tablet by mouth 2 (two) times daily for 7 days.   Azelastine-Fluticasone 137-50 MCG/ACT Susp Place 1 spray into both nostrils 2 (two) times daily.   chlorpheniramine 4 MG tablet Commonly known as: CHLOR-TRIMETON Take 4 mg by mouth 2 (two) times daily as needed (for seasonal allergies).   dextromethorphan-guaiFENesin 30-600 MG 12hr tablet Commonly known as: MUCINEX DM Take 1 tablet by mouth 2 (two) times daily for 10 days.   EPINEPHrine 0.3 mg/0.3 mL Soaj injection Commonly known as: EPI-PEN Inject 0.3 mg into the muscle as needed for anaphylaxis.   Xyzal Allergy 24HR 5 MG tablet Generic drug: levocetirizine Take 5 mg by mouth at bedtime.   levocetirizine 5 MG tablet Commonly known as: XYZAL Take 5 mg by mouth at bedtime.   montelukast 10 MG tablet Commonly known as: SINGULAIR TAKE 1 TABLET BY MOUTH EVERYDAY AT BEDTIME What changed: See the new instructions.   MULTIVITAMINS PO Take 1 packet by mouth See admin instructions. Shaklee Vita pack multivitamins- Take 1 packet by mouth once a day   OVER THE COUNTER MEDICATION Take 2 tablets by mouth See admin instructions. Shaklee [employer]- Take 2 tablets by mouth once a day   OVER THE COUNTER MEDICATION Take 1 tablet by mouth See admin instructions. Shaklee supplement for vitamin C- Take 1 tablet by mouth once a day   OVER THE COUNTER MEDICATION Take 5 tablets by mouth See admin instructions. Shaklee Osteomatrix- Take 5 tablets by mouth once a day   pantoprazole 40 MG tablet Commonly known as: PROTONIX TAKE 1 TABLET BY MOUTH EVERY  DAY What changed: when to take this   Protein Powd Take 2 Scoops by mouth See admin instructions. Shaklee life shake + collagen- Mix 2 scoopsful of powder into a desired beverage and drink by mouth once a day   TURMERIC PO Take 1 capsule by mouth See admin instructions. Shaklee- Take 1 capsule by mouth once a day   valACYclovir 500 MG tablet Commonly known as: VALTREX Take 500 mg by mouth daily as needed (for flares of fever blisters).   Wixela Inhub 250-50 MCG/ACT Aepb Generic drug: fluticasone-salmeterol INHALE 1 PUFF INTO THE LUNGS IN THE MORNING AND AT BEDTIME. What changed: when to take this        Follow-up Information     Kristian Covey, MD Follow up in 1 week(s).   Specialty: Family Medicine Contact information: [address] [place] [address] [phone]                Allergies  Allergen Reactions   Dog Epithelium (Canis Lupus Familiaris) Shortness Of Breath, Itching, Other (See Comments) and Cough    Respiratory Distress   Horse Epithelium Shortness Of Breath, Other (See Comments) and Cough    Respiratory Distress   Oxycodone Nausea Only   Sulfa Antibiotics Itching    Consultations: None   Procedures/Studies: CT Angio Chest PE W and/or Wo Contrast  Result Date: 11/10/2023 CLINICAL DATA:  Concern for pulmonary edema. EXAM: CT ANGIOGRAPHY CHEST WITH CONTRAST TECHNIQUE: Multidetector CT imaging of the chest was performed using the standard protocol during bolus administration of intravenous contrast. Multiplanar CT image reconstructions and MIPs were obtained to evaluate the vascular anatomy. RADIATION DOSE REDUCTION: This exam was performed according to the departmental dose-optimization program which includes automated exposure control, adjustment of the mA and/or kV according to patient size and/or use of iterative reconstruction technique. CONTRAST:  75mL OMNIPAQUE IOHEXOL 350 MG/ML SOLN COMPARISON:  Chest radiograph dated  11/10/2023. FINDINGS: Cardiovascular: There is no cardiomegaly or pericardial effusion. Mild atherosclerotic calcification of the thoracic aorta. No aneurysmal dilatation or dissection. No pulmonary artery embolus identified. Mediastinum/Nodes: Mildly enlarged right hilar lymph node measures 15 mm. Subcarinal lymph node measures 12 mm. The esophagus is grossly unremarkable. No mediastinal fluid collection. Lungs/Pleura: Bilateral lower lobe patchy consolidation and nodularity, right greater than left, consistent with pneumonia. Aspiration is not excluded. There is mucous impaction and occlusion of the right lower lobe bronchi bilaterally. There is no pleural effusion or pneumothorax. Upper Abdomen: No acute abnormality. Musculoskeletal: No acute osseous pathology. Review of the MIP images confirms the above findings. IMPRESSION: 1. No CT evidence of pulmonary artery embolus. 2. Mucous impaction of the bilateral lower lobe bronchi with postobstructive pneumonia. Follow-up to resolution recommended. 3. Mildly enlarged right hilar and subcarinal lymph nodes, likely reactive. 4.  Aortic Atherosclerosis (ICD10-I70.0). Electronically Signed   By: Elgie Collard M.D.   On: 11/10/2023 21:02   DG Chest 2 View  Result Date: 11/10/2023 CLINICAL DATA:  Shortness of breath. EXAM: CHEST - 2 VIEW COMPARISON:  07/22/2022 FINDINGS: Unchanged cardiac silhouette and mediastinal contours. Interval development of trace bilateral pleural effusions. No discrete focal airspace opacities. No evidence of edema. No pneumothorax. No acute osseous abnormalities. IMPRESSION: Interval development of trace bilateral pleural effusions. Electronically Signed   By: Simonne Come M.D.   On: 11/10/2023 16:47     Discharge Exam: Vitals:   11/13/23 0428 11/13/23 0848  BP: 110/70   Pulse: 76   Resp: 18   Temp: 98.8 F (37.1 C)   SpO2: 95% 96%   Vitals:   11/12/23 2203 11/13/23 0428 11/13/23 0500 11/13/23 0848  BP: 130/66 110/70     Pulse: 91 76    Resp: (!) 22 18    Temp: 99.3 F (37.4 C) 98.8 F (37.1 C)    TempSrc: Oral Oral    SpO2: 96% 95%  96%  Weight:   67.2 kg   Height:        General: Pt is alert, awake, not in acute distress Cardiovascular: RRR, S1/S2 +, no rubs, no gallops Respiratory: CTA bilaterally, no wheezing, no rhonchi Abdominal: Soft, NT, ND, bowel sounds + Extremities: no edema, no cyanosis    The results of significant diagnostics from this hospitalization (including imaging, microbiology, ancillary and laboratory) are listed below for reference.     Microbiology: Recent Results (from the past 240 hour(s))  Group A Strep by PCR     Status: None   Collection Time: 11/10/23  1:43 PM   Specimen: Throat; Sterile Swab  Result Value Ref Range Status   Group A Strep by PCR NOT DETECTED NOT DETECTED Final    Comment: Performed at [facility], [address] [place], [place] [address]  Resp panel by RT-PCR (RSV, Flu A&B, Covid)     Status: None   Collection Time: 11/10/23  1:52 PM  Result Value Ref Range Status   SARS Coronavirus 2 by RT PCR NEGATIVE NEGATIVE Final    Comment: (NOTE) SARS-CoV-2 target nucleic acids are NOT DETECTED.  The SARS-CoV-2 RNA is generally detectable in upper respiratory specimens during the acute phase of infection. The lowest concentration of SARS-CoV-2 viral copies this assay can detect is 138 copies/mL. A negative result does not preclude SARS-Cov-2 infection and should not be used as the sole basis for treatment or other patient management decisions. A negative result may occur with  improper specimen collection/handling, submission of specimen other than nasopharyngeal swab, presence of viral mutation(s) within the areas targeted by this assay, and inadequate number of viral copies(<138 copies/mL). A negative result must be combined with clinical observations, patient history, and epidemiological information. The expected result  is Negative.  Fact Sheet for Patients:  BloggerCourse.com  Fact Sheet for Healthcare Providers:  SeriousBroker.it  This test is no t yet approved or cleared by the [place] FDA and  has been authorized for detection and/or diagnosis of SARS-CoV-2 by FDA under an Emergency Use Authorization (EUA). This EUA will remain  in effect (meaning this test can be used) for the duration of the COVID-19 declaration under Section 564(b)(1) of the Act, 21 U.S.C.section 360bbb-3(b)(1), unless the authorization is terminated  or revoked sooner.       Influenza A by PCR NEGATIVE NEGATIVE Final   Influenza B by PCR NEGATIVE NEGATIVE Final    Comment: (NOTE) The Xpert Xpress SARS-CoV-2/FLU/RSV plus assay is intended as an aid in the diagnosis of influenza from Nasopharyngeal swab specimens and should not be used as a sole basis for treatment. Nasal washings and aspirates are unacceptable for Xpert Xpress SARS-CoV-2/FLU/RSV testing.  Fact Sheet for Patients: BloggerCourse.com  Fact Sheet for Healthcare Providers: SeriousBroker.it  This test is not yet approved or cleared by the [place] FDA and has been authorized for detection and/or diagnosis of SARS-CoV-2 by FDA under an Emergency Use Authorization (EUA). This EUA will remain in effect (meaning this test can be used) for the duration of the COVID-19 declaration under Section 564(b)(1) of the Act, 21 U.S.C. section 360bbb-3(b)(1), unless the authorization is terminated or revoked.     Resp Syncytial Virus by PCR NEGATIVE NEGATIVE Final    Comment: (NOTE) Fact Sheet for Patients: BloggerCourse.com  Fact Sheet for Healthcare Providers: SeriousBroker.it  This test is not yet approved or cleared by the [place] FDA and has been authorized for detection and/or diagnosis of  SARS-CoV-2 by FDA under an Emergency Use Authorization (EUA). This EUA will remain in effect (meaning this test can be used) for the duration of the COVID-19 declaration under Section 564(b)(1) of the Act, 21 U.S.C. section 360bbb-3(b)(1), unless the authorization is terminated or revoked.  Performed at [facility], [address], [place], [place] [address]   Blood culture (routine x 2)     Status: None (Preliminary result)   Collection Time: 11/10/23  9:16 PM   Specimen: BLOOD RIGHT HAND  Result Value Ref Range Status   Specimen Description   Final    BLOOD RIGHT HAND  Performed at [facility], [address], [place], [place] [address]    Special Requests   Final    BOTTLES DRAWN AEROBIC AND ANAEROBIC Blood Culture results may not be optimal due to an inadequate volume of blood received in culture bottles Performed at [facility], [address] [place], [place] [address]    Culture   Final    NO GROWTH 2 DAYS Performed at [facility], [address], [place], [place] [address]    Report Status PENDING  Incomplete  Blood culture (routine x 2)     Status: None (Preliminary result)   Collection Time: 11/10/23  9:31 PM   Specimen: BLOOD LEFT HAND  Result Value Ref Range Status   Specimen Description   Final    BLOOD LEFT HAND Performed at [facility], [address], [place], [place] [address]    Special Requests   Final    BOTTLES DRAWN AEROBIC AND ANAEROBIC Blood Culture results may not be optimal due to an inadequate volume of blood received in culture bottles Performed at [facility], [address] [place], [place] [address]    Culture   Final    NO GROWTH 2 DAYS Performed at [facility], [address] [place], [place] [address]    Report Status PENDING  Incomplete  Respiratory (~20 pathogens) panel by PCR     Status: None   Collection Time: 11/11/23  5:33 PM   Specimen:  Nasopharyngeal Swab; Respiratory  Result Value Ref Range Status   Adenovirus NOT DETECTED NOT DETECTED Final   Coronavirus 229E NOT DETECTED NOT DETECTED Final    Comment: (NOTE) The Coronavirus on the Respiratory Panel, DOES NOT test for the novel  Coronavirus (2019 nCoV)    Coronavirus HKU1 NOT DETECTED NOT DETECTED Final   Coronavirus NL63 NOT DETECTED NOT DETECTED Final   Coronavirus OC43 NOT DETECTED NOT DETECTED Final   Metapneumovirus NOT DETECTED NOT DETECTED Final   Rhinovirus / Enterovirus NOT DETECTED NOT DETECTED Final   Influenza A NOT DETECTED NOT DETECTED Final   Influenza B NOT DETECTED NOT DETECTED Final   Parainfluenza Virus 1 NOT DETECTED NOT DETECTED Final   Parainfluenza Virus 2 NOT DETECTED NOT DETECTED Final   Parainfluenza Virus 3 NOT DETECTED NOT DETECTED Final   Parainfluenza Virus 4 NOT DETECTED NOT DETECTED Final   Respiratory Syncytial Virus NOT DETECTED NOT DETECTED Final   Bordetella pertussis NOT DETECTED NOT DETECTED Final   Bordetella Parapertussis NOT DETECTED NOT DETECTED Final   Chlamydophila pneumoniae NOT DETECTED NOT DETECTED Final   Mycoplasma pneumoniae NOT DETECTED NOT DETECTED Final    Comment: Performed at [facility], [address], [place], [place] [address]     Labs: BNP (last 3 results) No results for input(s): "BNP" in the last 8760 hours. Basic Metabolic Panel: Recent Labs  Lab 11/10/23 1615 11/12/23 0503  NA 138 140  K 3.5 3.6  CL 105 107  CO2 24 25  GLUCOSE 90 97  BUN 8 7*  CREATININE 0.53 0.54  CALCIUM 9.1 8.6*   Liver Function Tests: No results for input(s): "AST", "ALT", "ALKPHOS", "BILITOT", "PROT", "ALBUMIN" in the last 168 hours. No results for input(s): "LIPASE", "AMYLASE" in the last 168 hours. No results for input(s): "AMMONIA" in the last 168 hours. CBC: Recent Labs  Lab 11/10/23 1615 11/12/23 0503 11/13/23 0541  WBC 17.9* 12.7* 10.9*  NEUTROABS 13.4*  --  6.8  HGB 12.0 11.2* 11.2*  HCT  35.4* 34.9* 34.8*  MCV 89.8 94.1 93.3  PLT 388 404* 443*   Cardiac Enzymes: No results for input(s): "CKTOTAL", "CKMB", "CKMBINDEX", "TROPONINI" in the last 168 hours. BNP: Invalid input(s): "POCBNP" CBG: No results for input(s): "GLUCAP" in the last 168 hours. D-Dimer No results for input(s): "DDIMER" in the last 72 hours. Hgb A1c No results for input(s): "HGBA1C" in the last 72 hours. Lipid Profile No results for input(s): "CHOL", "HDL", "LDLCALC", "TRIG", "CHOLHDL", "LDLDIRECT" in the last 72 hours. Thyroid function studies No results for input(s): "TSH", "T4TOTAL", "T3FREE", "THYROIDAB" in the last 72 hours.  Invalid input(s): "FREET3" Anemia work up No results for input(s): "VITAMINB12", "FOLATE", "FERRITIN", "TIBC", "IRON", "RETICCTPCT" in the last 72 hours. Urinalysis    Component Value Date/Time   COLORURINE YELLOW 12/10/2020 1640   APPEARANCEUR CLEAR 12/10/2020 1640   LABSPEC 1.016 12/10/2020 1640   PHURINE 6.5 12/10/2020 1640   GLUCOSEU NEGATIVE 12/10/2020 1640   HGBUR NEGATIVE 12/10/2020 1640   BILIRUBINUR negative 11/20/2015 1142   KETONESUR NEGATIVE 12/10/2020 1640   PROTEINUR NEGATIVE 12/10/2020 1640   UROBILINOGEN 0.2 11/20/2015 1142   NITRITE NEGATIVE 12/10/2020 1640   LEUKOCYTESUR NEGATIVE 12/10/2020 1640   Sepsis Labs Recent Labs  Lab 11/10/23 1615 11/12/23 0503 11/13/23 0541  WBC 17.9* 12.7* 10.9*   Microbiology Recent Results (from the past 240 hour(s))  Group A Strep by PCR     Status: None   Collection Time: 11/10/23  1:43 PM   Specimen: Throat; Sterile Swab  Result Value Ref Range Status   Group A Strep by PCR NOT DETECTED NOT DETECTED Final    Comment: Performed at [facility], [address] [place], [place] [address]  Resp panel by RT-PCR (RSV, Flu A&B, Covid)     Status: None   Collection Time: 11/10/23  1:52 PM  Result Value Ref Range Status   SARS Coronavirus 2 by RT PCR NEGATIVE NEGATIVE Final    Comment:  (NOTE) SARS-CoV-2 target nucleic acids are NOT DETECTED.  The SARS-CoV-2 RNA is generally detectable in upper respiratory specimens during the acute phase of infection. The lowest concentration of SARS-CoV-2 viral copies this assay can detect is 138 copies/mL. A negative result does not preclude SARS-Cov-2 infection and should not be used as the sole basis for treatment or other patient management decisions. A negative result may occur with  improper specimen collection/handling, submission of specimen other than nasopharyngeal swab, presence of viral mutation(s) within the areas targeted by this assay, and inadequate number of viral copies(<138 copies/mL). A negative result must be combined with clinical observations, patient history, and epidemiological information. The expected result is Negative.  Fact Sheet for Patients:  BloggerCourse.com  Fact Sheet for Healthcare Providers:  SeriousBroker.it  This test is no t yet approved or cleared by the [place] FDA and  has been authorized for detection and/or diagnosis of SARS-CoV-2 by FDA under an Emergency Use Authorization (EUA). This EUA will remain  in effect (meaning this test can be used) for the duration of the COVID-19 declaration under Section 564(b)(1) of the Act, 21 U.S.C.section 360bbb-3(b)(1), unless the authorization is terminated  or revoked sooner.       Influenza A by PCR NEGATIVE NEGATIVE Final   Influenza B by PCR NEGATIVE NEGATIVE Final    Comment: (NOTE) The Xpert Xpress SARS-CoV-2/FLU/RSV plus assay is intended as an aid in the diagnosis of influenza from Nasopharyngeal swab specimens and should not be used as  a sole basis for treatment. Nasal washings and aspirates are unacceptable for Xpert Xpress SARS-CoV-2/FLU/RSV testing.  Fact Sheet for Patients: BloggerCourse.com  Fact Sheet for Healthcare  Providers: SeriousBroker.it  This test is not yet approved or cleared by the [place] FDA and has been authorized for detection and/or diagnosis of SARS-CoV-2 by FDA under an Emergency Use Authorization (EUA). This EUA will remain in effect (meaning this test can be used) for the duration of the COVID-19 declaration under Section 564(b)(1) of the Act, 21 U.S.C. section 360bbb-3(b)(1), unless the authorization is terminated or revoked.     Resp Syncytial Virus by PCR NEGATIVE NEGATIVE Final    Comment: (NOTE) Fact Sheet for Patients: BloggerCourse.com  Fact Sheet for Healthcare Providers: SeriousBroker.it  This test is not yet approved or cleared by the [place] FDA and has been authorized for detection and/or diagnosis of SARS-CoV-2 by FDA under an Emergency Use Authorization (EUA). This EUA will remain in effect (meaning this test can be used) for the duration of the COVID-19 declaration under Section 564(b)(1) of the Act, 21 U.S.C. section 360bbb-3(b)(1), unless the authorization is terminated or revoked.  Performed at [facility], [address] [place], [place] [address]   Blood culture (routine x 2)     Status: None (Preliminary result)   Collection Time: 11/10/23  9:16 PM   Specimen: BLOOD RIGHT HAND  Result Value Ref Range Status   Specimen Description   Final    BLOOD RIGHT HAND Performed at [facility], [address], [place], [place] [address]    Special Requests   Final    BOTTLES DRAWN AEROBIC AND ANAEROBIC Blood Culture results may not be optimal due to an inadequate volume of blood received in culture bottles Performed at [facility], [address], [place], [place] [address]    Culture   Final    NO GROWTH 2 DAYS Performed at [facility], [address] [place], [place] [address]    Report Status PENDING   Incomplete  Blood culture (routine x 2)     Status: None (Preliminary result)   Collection Time: 11/10/23  9:31 PM   Specimen: BLOOD LEFT HAND  Result Value Ref Range Status   Specimen Description   Final    BLOOD LEFT HAND Performed at [facility], [address] [place], [place] [address]    Special Requests   Final    BOTTLES DRAWN AEROBIC AND ANAEROBIC Blood Culture results may not be optimal due to an inadequate volume of blood received in culture bottles Performed at [facility], [address] [place], [place] [address]    Culture   Final    NO GROWTH 2 DAYS Performed at [facility], [address], [place], [place] [address]    Report Status PENDING  Incomplete  Respiratory (~20 pathogens) panel by PCR     Status: None   Collection Time: 11/11/23  5:33 PM   Specimen: Nasopharyngeal Swab; Respiratory  Result Value Ref Range Status   Adenovirus NOT DETECTED NOT DETECTED Final   Coronavirus 229E NOT DETECTED NOT DETECTED Final    Comment: (NOTE) The Coronavirus on the Respiratory Panel, DOES NOT test for the novel  Coronavirus (2019 nCoV)    Coronavirus HKU1 NOT DETECTED NOT DETECTED Final   Coronavirus NL63 NOT DETECTED NOT DETECTED Final   Coronavirus OC43 NOT DETECTED NOT DETECTED Final   Metapneumovirus NOT DETECTED NOT DETECTED Final  Rhinovirus / Enterovirus NOT DETECTED NOT DETECTED Final   Influenza A NOT DETECTED NOT DETECTED Final   Influenza B NOT DETECTED NOT DETECTED Final   Parainfluenza Virus 1 NOT DETECTED NOT DETECTED Final   Parainfluenza Virus 2 NOT DETECTED NOT DETECTED Final   Parainfluenza Virus 3 NOT DETECTED NOT DETECTED Final   Parainfluenza Virus 4 NOT DETECTED NOT DETECTED Final   Respiratory Syncytial Virus NOT DETECTED NOT DETECTED Final   Bordetella pertussis NOT DETECTED NOT DETECTED Final   Bordetella Parapertussis NOT DETECTED NOT DETECTED Final   Chlamydophila pneumoniae NOT DETECTED NOT DETECTED  Final   Mycoplasma pneumoniae NOT DETECTED NOT DETECTED Final    Comment: Performed at [facility], [address] [place], [place] [address]    FURTHER DISCHARGE INSTRUCTIONS:   Get Medicines reviewed and adjusted: Please take all your medications with you for your next visit with your Primary MD   Laboratory/radiological data: Please request your Primary MD to go over all hospital tests and procedure/radiological results at the follow up, please ask your Primary MD to get all Hospital records sent to his/her office.   In some cases, they will be blood work, cultures and biopsy results pending at the time of your discharge. Please request that your primary care M.D. goes through all the records of your hospital data and follows up on these results.   Also Note the following: If you experience worsening of your admission symptoms, develop shortness of breath, life threatening emergency, suicidal or homicidal thoughts you must seek medical attention immediately by calling 911 or calling your MD immediately  if symptoms less severe.   You must read complete instructions/literature along with all the possible adverse reactions/side effects for all the Medicines you take and that have been prescribed to you. Take any new Medicines after you have completely understood and accpet all the possible adverse reactions/side effects.    Do not drive when taking Pain medications or sleeping medications (Benzodaizepines)   Do not take more than prescribed Pain, Sleep and Anxiety Medications. It is not advisable to combine anxiety,sleep and pain medications without talking with your primary care practitioner   Special Instructions: If you have smoked or chewed Tobacco  in the last 2 yrs please stop smoking, stop any regular Alcohol  and or any Recreational drug use.   Wear Seat belts while driving.   Please note: You were cared for by a hospitalist during your hospital stay. Once you are  discharged, your primary care physician will handle any further medical issues. Please note that NO REFILLS for any discharge medications will be authorized once you are discharged, as it is imperative that you return to your primary care physician (or establish a relationship with a primary care physician if you do not have one) for your post hospital discharge needs so that they can reassess your need for medications and monitor your lab values  Time coordinating discharge: Over 30 minutes  SIGNED:   Hughie Closs, MD  [facility] 11/13/2023, 11:52 AM *Please note that this is a verbal dictation therefore any spelling or grammatical errors are due to the "Dragon [employer] One" system interpretation. If 7PM-7AM, please contact night-coverage [email or web address]

## 2023-11-16 LAB — CULTURE, BLOOD (ROUTINE X 2)
Culture: NO GROWTH
Culture: NO GROWTH

## 2023-11-22 ENCOUNTER — Encounter: Payer: Self-pay | Admitting: Family Medicine

## 2023-11-22 ENCOUNTER — Ambulatory Visit (INDEPENDENT_AMBULATORY_CARE_PROVIDER_SITE_OTHER): Payer: 59 | Admitting: Family Medicine

## 2023-11-22 VITALS — BP 110/72 | HR 86 | Temp 98.1°F | Ht 66.0 in | Wt 147.1 lb

## 2023-11-22 DIAGNOSIS — R490 Dysphonia: Secondary | ICD-10-CM

## 2023-11-22 DIAGNOSIS — J189 Pneumonia, unspecified organism: Secondary | ICD-10-CM

## 2023-11-22 DIAGNOSIS — K219 Gastro-esophageal reflux disease without esophagitis: Secondary | ICD-10-CM

## 2023-11-22 LAB — CBC WITH DIFFERENTIAL/PLATELET
Basophils Absolute: 0.1 10*3/uL (ref 0.0–0.1)
Basophils Relative: 1.1 % (ref 0.0–3.0)
Eosinophils Absolute: 0.1 10*3/uL (ref 0.0–0.7)
Eosinophils Relative: 0.8 % (ref 0.0–5.0)
HCT: 39.9 % (ref 36.0–46.0)
Hemoglobin: 13.3 g/dL (ref 12.0–15.0)
Lymphocytes Relative: 37.1 % (ref 12.0–46.0)
Lymphs Abs: 2.8 10*3/uL (ref 0.7–4.0)
MCHC: 33.4 g/dL (ref 30.0–36.0)
MCV: 91.3 fL (ref 78.0–100.0)
Monocytes Absolute: 0.5 10*3/uL (ref 0.1–1.0)
Monocytes Relative: 6.2 % (ref 3.0–12.0)
Neutro Abs: 4.2 10*3/uL (ref 1.4–7.7)
Neutrophils Relative %: 54.8 % (ref 43.0–77.0)
Platelets: 651 10*3/uL — ABNORMAL HIGH (ref 150.0–400.0)
RBC: 4.37 Mil/uL (ref 3.87–5.11)
RDW: 13.7 % (ref 11.5–15.5)
WBC: 7.6 10*3/uL (ref 4.0–10.5)

## 2023-11-22 NOTE — Patient Instructions (Addendum)
Consider ENT referral to Dr Suszanne Conners to evaluate the persistent hoarseness.    I will also set up referral to nutritionist.

## 2023-11-22 NOTE — Progress Notes (Signed)
Established Patient Office Visit  Subjective   Patient ID: Laura Wheeler, female    DOB: 1961-10-29  Age: 62 y.o. MRN: [id number]  Chief Complaint  Patient presents with   Hospitalization Follow-up    HPI   Lisbed is seen today for hospital follow up for bilateral post-obstructive pneumonia.  Her past medical history is significant for with Parkinson White syndrome, allergic rhinitis, osteoarthritis, history of kidney stones, and reactive airway issues with frequent bronchitis in the past.  She had been seen here on 13 November with sore throat and cough.  Strep and COVID testing were negative.  O2 sats that visit were 97% and she was in no respiratory distress but she has had some lower O2 sats at home earlier in the week.  She is maintained on Wixela inhaler and we have given Depo-Medrol 80 mg IM.  She left for [place] for her son's wedding the next day and did not see any improvements there and went to urgent care in [place] was given multiple albuterol treatments.  Did not improve any and was seen in the ER here (after return) on the day of admission with low-grade fever.  Really minimal cough but she had some increased shortness of breath.  No obvious infiltrate on plain chest x-ray but did show evidence for small bilateral pleural effusions.  CT angiogram of the chest did show mucus impaction bilateral lower lobe bronchi with postobstructive pneumonia.  No evidence for PE.  Patient was treated with Rocephin and Zithromax and gradually improved.  Initial white count of 17,000 and 10,000 at discharge.  She was discharged on Augmentin and has completed that at this time  She is very upset that she had worsening symptoms and did not improve and was upset with not getting an antibiotic prior to her travel to [place].  It was our judgment at that point that she had likely viral illness and at visit of 11/03/2023 had O2 sats of 97% on room air and no fever and nonfocal exam.  She has  now been having some persistent hoarseness now for over 3 weeks.  No history of smoking.  Does have history of GERD but controlled on Protonix.  No active GERD symptoms.  No known history of thrush. Does use Wixella inhaler per pulmonary.   She is requesting nutrition referral.  She has lost considerable out of weight on her own and would like to get nutrition referral for some feedback regarding weight loss maintenance  Past Medical History:  Diagnosis Date   Arthritis    "knees" (05/19/2013)   Bronchitis    History of cardiac radiofrequency ablation 04/20/2013   Hx of echocardiogram    Echo (12/15):  EF 60-65%, no RWMA, Gr 1 DD   SVT (supraventricular tachycardia) (HCC)    WPW (Wolff-Parkinson-White syndrome) ~ 1983   a. s/p ablation 05/20/13.   Past Surgical History:  Procedure Laterality Date   ANAL FISSURE REPAIR  1980's   BREAST BIOPSY Left 2000's   EXCISION MORTON'S NEUROMA Left 05/04/2013   Procedure: LEFT SECOND MT  WEIL/SECOND WEBB SPACE NEUROMA EXCISION ;  Surgeon: [name], MD;  Location: [facility] OR;  Service: Orthopedics;  Laterality: Left;   FOOT SURGERY Left 09/2014   hardware removal   SUPRAVENTRICULAR TACHYCARDIA ABLATION  05/19/2013   SUPRAVENTRICULAR TACHYCARDIA ABLATION N/A 05/19/2013   Procedure: SUPRAVENTRICULAR TACHYCARDIA ABLATION;  Surgeon: Marinus Maw, MD;  Location[facility] C[facility] LAB;  Service: Cardiovascular;  Laterality: N/A;  TOTAL KNEE ARTHROPLASTY Right 03/10/2022   Procedure: Right TOTAL KNEE ARTHROPLASTY;  Surgeon: Kathryne Hitch, MD;  Location: [facility] OR;  Service: Orthopedics;  Laterality: Right;    reports that she has never smoked. She has never used smokeless tobacco. She reports that she does not currently use alcohol. She reports that she does not use drugs. family history includes Arthritis in her brother; Cancer in her father; Depression in her mother; Diabetes in her brother and mother; Hyperlipidemia in her mother; Hypertension in her father;  Hypothyroidism in her mother; Melanoma in her mother. Allergies  Allergen Reactions   Dog Epithelium (Canis Lupus Familiaris) Shortness Of Breath, Itching, Other (See Comments) and Cough    Respiratory Distress   Horse Epithelium Shortness Of Breath, Other (See Comments) and Cough    Respiratory Distress   Oxycodone Nausea Only   Sulfa Antibiotics Itching    Review of Systems  Constitutional:  Negative for chills and fever.  HENT:  Negative for sinus pain.   Respiratory:  Negative for hemoptysis, sputum production, shortness of breath and wheezing.   Cardiovascular:  Negative for chest pain.      Objective:     BP 110/72 (BP Location: Left Arm, Patient Position: Sitting, Cuff Size: Normal)   Pulse 86   Temp 98.1 F (36.7 C) (Oral)   Ht 5\' 6"  (1.676 m)   Wt 147 lb 1.6 oz (66.7 kg)   LMP 09/20/2014   SpO2 94%   BMI 23.74 kg/m  BP Readings from Last 3 Encounters:  11/22/23 110/72  11/13/23 110/70  11/03/23 112/70   Wt Readings from Last 3 Encounters:  11/22/23 147 lb 1.6 oz (66.7 kg)  11/13/23 148 lb 2.4 oz (67.2 kg)  11/03/23 149 lb 12.8 oz (67.9 kg)      Physical Exam Vitals reviewed.  Constitutional:      General: She is not in acute distress.    Appearance: She is not ill-appearing.  Cardiovascular:     Rate and Rhythm: Normal rate and regular rhythm.  Pulmonary:     Effort: Pulmonary effort is normal.     Comments: Slightly diminished breath sounds right base.  No wheezes.  No rales. Neurological:     Mental Status: She is alert.      No results found for any visits on 11/22/23.  Last CBC Lab Results  Component Value Date   WBC 7.6 11/22/2023   HGB 13.3 11/22/2023   HCT 39.9 11/22/2023   MCV 91.3 11/22/2023   MCH 30.0 11/13/2023   RDW 13.7 11/22/2023   PLT 651.0 (H) 11/22/2023   Last metabolic panel Lab Results  Component Value Date   GLUCOSE 97 11/12/2023   NA 140 11/12/2023   K 3.6 11/12/2023   CL 107 11/12/2023   CO2 25 11/12/2023    BUN 7 (L) 11/12/2023   CREATININE 0.54 11/12/2023   GFRNONAA >60 11/12/2023   CALCIUM 8.6 (L) 11/12/2023   PROT 8.3 05/28/2023   ALBUMIN 4.5 05/28/2023   BILITOT 0.6 05/28/2023   ALKPHOS 98 05/28/2023   AST 20 05/28/2023   ALT 18 05/28/2023   ANIONGAP 8 11/12/2023      The 10-year ASCVD risk score (Arnett DK, et al., 2019) is: 2.7%    Assessment & Plan:   Problem List Items Addressed This Visit       Unprioritized   Obstructive pneumonia   Other Visit Diagnoses     Community acquired pneumonia, unspecified laterality    -  Primary   Relevant Orders   CBC with Differential/Platelet   Gastroesophageal reflux disease, unspecified whether esophagitis present       Hoarseness       Relevant Orders   Ambulatory referral to ENT     62 year old female with recent admission for bilateral postobstructive pneumonia in the setting of significant mucous plugging and history of asthma.  She sees pulmonary and has scheduled follow-up 30th of this month.  -Recommend follow-up chest x-ray in 2 to 3 weeks to document clearing -Repeat CBC with differential today -Set up ENT referral regarding her persistent hoarseness (> 3 weeks) for further evaluation with possible naso-laryngoscopy -Setting up nutrition referral per patient request -Follow-up immediately for increased shortness of breath, worsening cough, or any recurrent fever  No follow-ups on file.    Evelena Peat, MD

## 2023-11-23 ENCOUNTER — Telehealth: Payer: Self-pay

## 2023-11-23 NOTE — Telephone Encounter (Signed)
Patient called and left a message - asking about her MRI results and what her next steps are. Looks like results dropped yesterday. Please advise -thanks

## 2023-11-24 ENCOUNTER — Telehealth: Payer: Self-pay | Admitting: Podiatry

## 2023-11-24 NOTE — Telephone Encounter (Signed)
Lvm for pt to call to schedule an appt with Dr Al Corpus to discuss mri results.

## 2023-11-29 ENCOUNTER — Ambulatory Visit (INDEPENDENT_AMBULATORY_CARE_PROVIDER_SITE_OTHER): Payer: 59 | Admitting: Otolaryngology

## 2023-11-29 ENCOUNTER — Encounter (INDEPENDENT_AMBULATORY_CARE_PROVIDER_SITE_OTHER): Payer: Self-pay | Admitting: Otolaryngology

## 2023-11-29 VITALS — BP 121/61 | HR 81 | Ht 66.0 in | Wt 145.0 lb

## 2023-11-29 DIAGNOSIS — J383 Other diseases of vocal cords: Secondary | ICD-10-CM

## 2023-11-29 DIAGNOSIS — R490 Dysphonia: Secondary | ICD-10-CM

## 2023-11-29 DIAGNOSIS — R0982 Postnasal drip: Secondary | ICD-10-CM

## 2023-11-29 DIAGNOSIS — R0981 Nasal congestion: Secondary | ICD-10-CM | POA: Diagnosis not present

## 2023-11-29 DIAGNOSIS — K219 Gastro-esophageal reflux disease without esophagitis: Secondary | ICD-10-CM

## 2023-11-29 MED ORDER — FAMOTIDINE 20 MG PO TABS: 20.0000 mg | ORAL_TABLET | Freq: Two times a day (BID) | ORAL | 11 refills | Status: DC

## 2023-11-29 NOTE — Progress Notes (Signed)
ENT CONSULT:  Reason for Consult: dysphonia for 4-5 weeks and throat discomfort  dyspnea recent PNA  HPI:  Discussed the use of AI scribe software for clinical note transcription with the patient, who gave verbal consent to proceed.  History of Present Illness   The patient is a 62 yoF, with a history of GERD, bronchitis, and recent pneumonia, presents for evaluation of chronic voice changes. They report losing their voice on November 10th, which was accompanied by respiratory issues. Despite feeling unwell, they traveled to [place] for a wedding, where their condition worsened. They sought medical help in [place], receiving prednisone and three breathing treatments. Upon [place] to [place] were admitted to the hospital with pneumonia.  Post-hospitalization, the patient reports persistent voice scratchiness and a sensation of suffocation, despite receiving antibiotics and breathing treatments. They have been managing GERD symptoms with a PPI, which they express a desire to discontinue due to concerns about long-term use side effects. They also report a history of mucus in the throat, which was previously attributed to GERD by another ENT. They have attempted dietary changes to manage these symptoms, with some success.  The patient has a history of recurrent bronchitis, typically in the fall, and has had pneumonia twice. They have received all recommended vaccinations. They are currently under the care of an allergist and have been receiving allergy shots for two years. They also report a history of knee replacement surgery. The patient denies any current cough, but reports a sensation of tightness with breathing and has not been able to cough up any sputum. They are scheduled to see a pulmonologist later this month.        Records Reviewed: Family Medicine office visit 11/22/23 62 year old female with recent admission for bilateral post-obstructive pneumonia in the setting of significant mucous plugging and history of asthma.  She sees pulmonary and has scheduled follow-up 30th of this month.   for hospital follow up for bilateral post-obstructive pneumonia.  Her past medical history  is significant for with Parkinson White syndrome, allergic rhinitis, osteoarthritis, history of kidney stones, and reactive airway issues with frequent bronchitis in the past.   She had been seen here on 13 November with sore throat and cough.  Strep and COVID testing were negative.  O2 sats that visit were 97% and she was in no respiratory distress but she has had some lower O2 sats at home earlier in the week.  She is maintained on Wixela inhaler and we have given Depo-Medrol 80 mg IM.  She left for [place] for her son's wedding the next day and did not see any improvements there and went to urgent care in [place] was given multiple albuterol treatments.  Did not improve any and was seen in the ER here (after return) on the day of admission with low-grade fever.  Really minimal cough but she had some increased shortness of breath.  No obvious infiltrate on plain chest x-ray but did show evidence for small bilateral pleural effusions.  CT angiogram of the chest did show mucus impaction bilateral lower lobe bronchi with postobstructive pneumonia.  No evidence for PE.  Patient was treated with Rocephin and Zithromax and gradually improved.  Initial white count of 17,000 and 10,000 at discharge.  She was discharged on Augmentin and has completed that at this time   She is very upset that she had worsening symptoms and did not improve and was upset with not getting an antibiotic prior to her travel to [place].  It was our judgment at that point that she had likely viral illness and at visit of 11/03/2023 had O2 sats of 97% on room  air and no fever and nonfocal exam.  She has now been having some persistent hoarseness now for over 3 weeks.  No history of smoking.  Does have history of GERD but controlled on Protonix.  No active GERD symptoms.  No known history of thrush. Does use Wixella inhaler per pulmonary.    She is requesting nutrition referral.  She has lost considerable out of weight on her own  and would like to get nutrition referral for some feedback regarding weight loss maintenance  62 year old female with recent admission for bilateral postobstructive pneumonia in the setting of significant mucous plugging and history of asthma.  She sees pulmonary and has scheduled follow-up 30th of this month.   -Recommend follow-up chest x-ray in 2 to 3 weeks to document clearing -Repeat CBC with differential today -Set up ENT referral regarding her persistent hoarseness (> 3 weeks) for further evaluation with possible naso-laryngoscopy -Setting up nutrition referral per patient request -Follow-up immediately for increased shortness of breath, worsening cough, or any recurrent fever     Hospital Admission 11/13/23 for PNA 62 y.o. female with medical history significant of WPW status post ablation, asthma, recurrent bronchitis, allergic rhinitis presented to the ED with shortness of breath and URI symptoms for about 13 days.  She spoke with her primary care doctor who prescribed a course of prednisone for her.  She took about 2 doses but did not complete her therapy.  She continued to feel shortness of breath over the past week and has recently developed a cough productive of small amounts of yellowish sputum.  She denied any fevers or chills at home.    At [facility] ED, she was noted to have a fever of 100.60F on arrival and was tachycardic to the 110s.  She was hemodynamically stable.  CBC showed a WBC count of 17.9.  COVID/influenza/RSV were all negative, group A strep PCR was negative, and lactate was normal.  CTA chest was negative for PE but showed mucus impaction of bilateral lower lobe bronchi with postobstructive pneumonia.  Started on antibiotics, admitted under hospital service.  Details below.   Severe sepsis and acute hypoxic respiratory failure secondary to bilateral postobstructive pneumonia, POA: Patient met criteria for severe sepsis based on fever, tachypnea, leukocytosis and acute  hypoxia.  Pneumonia confirmed on CT chest. RVP negative as well.  Continued on Rocephin and Zithromax.  Improved significantly.  Currently on room air.  Stable for discharge.  Discharging on 7 days of Augmentin and cough medications.  Resume PTA albuterol inhaler.   WPW syndrome status post ablation: Currently asymptomatic.   Office visit with Dr Pollyann Kennedy 04/15/23 For the past 2 years she has had progressive worsening problem with clearing her throat, intermittent soreness of her throat. She does get occasional heartburn. She drinks couple cups of coffee every morning and wine on occasion. She tried eliminating coffee for 1 week without relief. She also tried a PPI medication for couple months without relief. She has had some problems with allergies and bronchitis. She did not have childhood asthma. She is on allergy shots right now.  Impression & Plans:  Normal head neck exam. No signs of infection or neoplasm. Her symptoms are consistent with reflux laryngitis. Recommend complete illumination of caffeine.We discussed causes of reflux, including lifestyle and dietary factors. Recommend strict avoidance of all tobacco, caffeine, alcohol, chocolate and peppermint. A reflux handout with more detailed instructions was provided to the patient.    Past Medical History:  Diagnosis Date   Arthritis    "  knees" (05/19/2013)   Bronchitis    History of cardiac radiofrequency ablation 04/20/2013   Hx of echocardiogram    Echo (12/15):  EF 60-65%, no RWMA, Gr 1 DD   SVT (supraventricular tachycardia) (HCC)    WPW (Wolff-Parkinson-White syndrome) ~ 1983   a. s/p ablation 05/20/13.    Past Surgical History:  Procedure Laterality Date   ANAL FISSURE REPAIR  1980's   BREAST BIOPSY Left 2000's   EXCISION MORTON'S NEUROMA Left 05/04/2013   Procedure: LEFT SECOND MT  WEIL/SECOND WEBB SPACE NEUROMA EXCISION ;  Surgeon: [name], MD;  Location: [facility] OR;  Service: Orthopedics;  Laterality: Left;   FOOT SURGERY Left  09/2014   hardware removal   SUPRAVENTRICULAR TACHYCARDIA ABLATION  05/19/2013   SUPRAVENTRICULAR TACHYCARDIA ABLATION N/A 05/19/2013   Procedure: SUPRAVENTRICULAR TACHYCARDIA ABLATION;  Surgeon: Marinus Maw, MD;  Location: [facility] CATH LAB;  Service: Cardiovascular;  Laterality: N/A;   TOTAL KNEE ARTHROPLASTY Right 03/10/2022   Procedure: Right TOTAL KNEE ARTHROPLASTY;  Surgeon: Kathryne Hitch, MD;  Location: [facility] OR;  Service: Orthopedics;  Laterality: Right;    Family History  Problem Relation Age of Onset   Diabetes Mother    Depression Mother    Melanoma Mother    Hypothyroidism Mother    Hyperlipidemia Mother    Cancer Father        ?osteosarcoma leg   Hypertension Father    Diabetes Brother    Arthritis Brother    Heart attack Neg Hx     Social History:  reports that she has never smoked. She has never used smokeless tobacco. She reports that she does not currently use alcohol. She reports that she does not use drugs.  Allergies:  Allergies  Allergen Reactions   Dog Epithelium (Canis Lupus Familiaris) Shortness Of Breath, Itching, Other (See Comments) and Cough    Respiratory Distress   Horse Epithelium Shortness Of Breath, Other (See Comments) and Cough    Respiratory Distress   Oxycodone Nausea Only   Sulfa Antibiotics Itching    Medications: I have reviewed the patient's current medications.  The PMH, PSH, Medications, Allergies, and SH were reviewed and updated.  ROS: Constitutional: Negative for fever, weight loss and weight gain. Cardiovascular: Negative for chest pain and dyspnea on exertion. Respiratory: Is not experiencing shortness of breath at rest. Gastrointestinal: Negative for nausea and vomiting. Neurological: Negative for headaches. Psychiatric: The patient is not nervous/anxious  Blood pressure 121/61, pulse 81, height 5\' 6"  (1.676 m), weight 145 lb (65.8 kg), last menstrual period 09/20/2014, SpO2 97%.  PHYSICAL EXAM:  Exam: General:  Well-developed, well-nourished Communication and Voice: raspy  Respiratory Respiratory effort: Equal inspiration and expiration without stridor Cardiovascular Peripheral Vascular: Warm extremities with equal color/perfusion Eyes: No nystagmus with equal extraocular motion bilaterally Neuro/Psych/Balance: Patient oriented to person, place, and time; Appropriate mood and affect; Gait is intact with no imbalance; Cranial nerves I-XII are intact Head and Face Inspection: Normocephalic and atraumatic without mass or lesion Palpation: Facial skeleton intact without bony stepoffs Salivary Glands: No mass or tenderness Facial Strength: Facial motility symmetric and full bilaterally ENT Pinna: External ear intact and fully developed External canal: Canal is patent with intact skin Tympanic Membrane: Clear and mobile External Nose: No scar or anatomic deformity Internal Nose: Septum is deviated to the left. No polyp, or purulence. Mucosal edema and erythema present.  Bilateral inferior turbinate hypertrophy.  Lips, Teeth, and gums: Mucosa and teeth intact and viable TMJ: No pain to palpation with  full mobility Oral cavity/oropharynx: No erythema or exudate, no lesions present Nasopharynx: No mass or lesion with intact mucosa Hypopharynx: Intact mucosa without pooling of secretions Larynx Glottic: Full true vocal cord mobility without lesion or mass mild thickening along the edges of the mid 1/3 of b/l VF, VF atrophy b/l incomplete closure Supraglottic: Normal appearing epiglottis and AE folds Interarytenoid Space: No or minimal pachydermia or edema Subglottic Space: Patent without lesion or edema Neck Neck and Trachea: Midline trachea without mass or lesion Thyroid: No mass or nodularity Lymphatics: No lymphadenopathy  Procedure:   Preoperative diagnosis: hoarseness dysphonia  Postoperative diagnosis:   same + GERD LPR + PND  Procedure: Flexible fiberoptic laryngoscopy with stroboscopy  (60454)  Surgeon: Ashok Croon, MD  Anesthesia: Topical lidocaine and Afrin  Complications: None  Condition is stable throughout exam  Indications and consent:   The patient presents to the clinic with hoarseness. All the risks, benefits, and potential complications were reviewed with the patient preoperatively and informed verbal consent was obtained.  Procedure: The patient was seated upright in the exam chair.   Topical lidocaine and Afrin were applied to the nasal cavity. After adequate anesthesia had occurred, the flexible telescope was passed into the nasal cavity. The nasopharynx was patent without mass or lesion. The scope was passed behind the soft palate and directed toward the base of tongue. The base of tongue was visualized and was symmetric with no apparent masses or abnormal appearing tissue. There were no signs of a mass or pooling of secretions in the piriform sinuses. The supraglottic structures were normal.  The true vocal cords are mobile, atrophic b/l. The medial edges were bowed with thickening mid 1/3 along the edge c/w phonotrauma. Closure was incomplete, spindle-shaped glottic gap. Periodicity present. The mucosal wave and amplitude were normal and intact. There is moderate interarytenoid pachydermia and post cricoid edema.   The laryngoscope was then slowly withdrawn and the patient tolerated the procedure well. There were no complications or blood loss.   Studies Reviewed:CT Angio 11/10/23 CLINICAL DATA:  Concern for pulmonary edema.   EXAM: CT ANGIOGRAPHY CHEST WITH CONTRAST   TECHNIQUE: Multidetector CT imaging of the chest was performed using the standard protocol during bolus administration of intravenous contrast. Multiplanar CT image reconstructions and MIPs were obtained to evaluate the vascular anatomy.   RADIATION DOSE REDUCTION: This exam was performed according to the departmental dose-optimization program which includes automated exposure  control, adjustment of the mA and/or kV according to patient size and/or use of iterative reconstruction technique.   CONTRAST:  75mL OMNIPAQUE IOHEXOL 350 MG/ML SOLN   COMPARISON:  Chest radiograph dated 11/10/2023.   FINDINGS: Cardiovascular: There is no cardiomegaly or pericardial effusion. Mild atherosclerotic calcification of the thoracic aorta. No aneurysmal dilatation or dissection. No pulmonary artery embolus identified.   Mediastinum/Nodes: Mildly enlarged right hilar lymph node measures 15 mm. Subcarinal lymph node measures 12 mm. The esophagus is grossly unremarkable. No mediastinal fluid collection.   Lungs/Pleura: Bilateral lower lobe patchy consolidation and nodularity, right greater than left, consistent with pneumonia. Aspiration is not excluded. There is mucous impaction and occlusion of the right lower lobe bronchi bilaterally. There is no pleural effusion or pneumothorax.   Upper Abdomen: No acute abnormality.   Musculoskeletal: No acute osseous pathology.   Review of the MIP images confirms the above findings.   IMPRESSION: 1. No CT evidence of pulmonary artery embolus. 2. Mucous impaction of the bilateral lower lobe bronchi with postobstructive pneumonia. Follow-up to resolution  recommended. 3. Mildly enlarged right hilar and subcarinal lymph nodes, likely reactive. 4.  Aortic Atherosclerosis (ICD10-I70.0).   CXR 11/10/23 EXAM: CHEST - 2 VIEW   COMPARISON:  07/22/2022   FINDINGS: Unchanged cardiac silhouette and mediastinal contours. Interval development of trace bilateral pleural effusions. No discrete focal airspace opacities. No evidence of edema. No pneumothorax. No acute osseous abnormalities.   IMPRESSION: Interval development of trace bilateral pleural effusions.    Assessment/Plan: Encounter Diagnoses  Name Primary?   Dysphonia Yes   Age-related vocal fold atrophy    Glottic insufficiency    Chronic nasal congestion    Chronic  GERD    Post-nasal drip     Assessment and Plan    Chronic Dysphonia and Voice Changes Chronic voice changes since November 10th, with associated respiratory issues, and admission for PNA. Hx of bronchitis in the past, hx of environmental allergies, on allergy shots.   Strobe today: The true vocal folds are mobile, atrophic b/l. The medial edges were bowed with thickening mid 1/3 along the edge c/w phonotrauma.Closure was incomplete, spindle-shaped glottic gap. Evidence of post-nasal drainage and GERD LPR.  Discussed the potential benefits of voice therapy to improve vocal function and reduce irritation. - Refer to speech therapy for voice therapy - Continue current nasal spray regimen (Astelin, Flonase)   Chronic nasal congestion and Postnasal Drip/on allergy shots for environmental allergies Postnasal drip contributing to throat irritation and voice changes. Currently managed with nasal sprays and antihistamines. Discussed optimizing control of postnasal drainage to reduce throat irritation and improve voice quality. - Continue current nasal spray regimen (Astelin, Flonase) 2 puffs b/l nares BID - Continue Xyzal 5 mg daily if effective - consider nasal saline rinses   Pneumonia Recent pneumonia diagnosed via CT scan on November 20th after a trip to [place], treated with antibiotics and breathing treatments. Symptoms have improved. Discussed the importance of follow-up with a pulmonary doctor to ensure complete resolution and evaluate for underlying conditions contributing to recurrent pneumonia. - Follow up with pulmonary doctor on November 30th - Discuss recurrent pneumonia with allergist for additional workup to rule out conditions such as CVID and other conditions predisposing to recurrent respiratory infections  - consider limiting exposure to sick contacts while traveling  Chronic Gastroesophageal Reflux Disease (GERD) GERD managed with PPI. Patient desires to discontinue PPI  due to long-term use concerns. Discussed risks of long-term PPI use. Explained benefits of switching to Pepcid and gradual tapering off PPI to avoid rebound acid hypersecretion. Recommended dietary and lifestyle changes to manage reflux symptoms. - Switch from PPI to Pepcid 20 mg BID, tapering off PPI gradually - Recommended reflux supplement Reflux Gourmet available on [place] - Provided website resource for dietary and lifestyle changes to manage reflux  General Health Maintenance Up to date on flu, COVID, RSV, pneumonia, and shingles vaccinations. - No additional recommendations at this time  Follow-up - Follow in a few months after a trial of voice therapy - Follow up with allergist in January or February - Follow up with GI specialist for GERD management. Already established with GI.     Thank you for allowing me to participate in the care of this patient. Please do not hesitate to contact me with any questions or concerns.   Ashok Croon, MD Otolaryngology [facility] Phone: [phone] Fax: [phone]    11/29/2023, 6:38 PM

## 2023-11-29 NOTE — Patient Instructions (Addendum)
--  Avoid lying down for at least two hours after a meal or after drinking acidic beverages, like soda, or other caffeinated beverages. This can help to prevent stomach contents from flowing back into the esophagus. -Keep your head elevated while you sleep. Using an extra pillow or two can also help to prevent reflux. -Eat smaller and more frequent meals each day instead of a few large meals. This promotes digestion and can aid in preventing heartburn. -Wear loose-fitting clothes to ease pressure on the stomach, which can worsen heartburn and reflux. -Reduce excess weight around the midsection. This can ease pressure on the stomach. Such pressure can force some stomach contents back up the esophagus - checkout this website about reflux - GamingLesson.[email or web address] - start Pepcid and wean off PPI slowly - Take Reflux Gourmet (natural supplement available on [employer]) to help with symptoms of chronic throat irritation     - continue Dymista and Xyzal   - see Pulmonology as scheduled

## 2023-11-29 NOTE — Telephone Encounter (Signed)
Pt returned call and is not wanting to make an appt at this time since Dr Al Corpus stated she would not currently be a candidate for a  joint surgery unless it worsens. She will call if needed.   She did not want to come in if the provider did not think she was a candidate.

## 2023-12-01 ENCOUNTER — Ambulatory Visit: Payer: 59 | Attending: Otolaryngology

## 2023-12-01 ENCOUNTER — Other Ambulatory Visit: Payer: Self-pay

## 2023-12-01 DIAGNOSIS — R490 Dysphonia: Secondary | ICD-10-CM | POA: Insufficient documentation

## 2023-12-01 NOTE — Therapy (Signed)
OUTPATIENT SPEECH LANGUAGE PATHOLOGY VOICE EVALUATION   Patient Name: Laura Wheeler MRN: [id number] DOB:1961-03-29, 62 y.o., female Today's Date: 12/01/2023  PCP: Evelena Peat, MD REFERRING PROVIDER: Ashok Croon, MD  END OF SESSION:  End of Session - 12/01/23 2319     Visit Number 1    Number of Visits 13    Date for SLP Re-Evaluation 02/04/24    SLP Start Time 0804    SLP Stop Time  0845    SLP Time Calculation (min) 41 min    Activity Tolerance Patient tolerated treatment well             Past Medical History:  Diagnosis Date   Arthritis    "knees" (05/19/2013)   Bronchitis    History of cardiac radiofrequency ablation 04/20/2013   Hx of echocardiogram    Echo (12/15):  EF 60-65%, no RWMA, Gr 1 DD   SVT (supraventricular tachycardia) (HCC)    WPW (Wolff-Parkinson-White syndrome) ~ 1983   a. s/p ablation 05/20/13.   Past Surgical History:  Procedure Laterality Date   ANAL FISSURE REPAIR  1980's   BREAST BIOPSY Left 2000's   EXCISION MORTON'S NEUROMA Left 05/04/2013   Procedure: LEFT SECOND MT  WEIL/SECOND WEBB SPACE NEUROMA EXCISION ;  Surgeon: [name], MD;  Location: [facility] OR;  Service: Orthopedics;  Laterality: Left;   FOOT SURGERY Left 09/2014   hardware removal   SUPRAVENTRICULAR TACHYCARDIA ABLATION  05/19/2013   SUPRAVENTRICULAR TACHYCARDIA ABLATION N/A 05/19/2013   Procedure: SUPRAVENTRICULAR TACHYCARDIA ABLATION;  Surgeon: Marinus Maw, MD;  Location: [facility] CATH LAB;  Service: Cardiovascular;  Laterality: N/A;   TOTAL KNEE ARTHROPLASTY Right 03/10/2022   Procedure: Right TOTAL KNEE ARTHROPLASTY;  Surgeon: Kathryne Hitch, MD;  Location: [facility] OR;  Service: Orthopedics;  Laterality: Right;   Patient Active Problem List   Diagnosis Date Noted   Acute respiratory failure with hypoxia (HCC) 11/13/2023   Mucus plugging of bronchi 11/11/2023   Obstructive pneumonia 11/11/2023   Pneumonia 11/10/2023   Arthrofibrosis of knee joint, right  04/20/2022   Unilateral primary osteoarthritis, right knee 03/10/2022   DJD (degenerative joint disease) 03/10/2022   Status post total right knee replacement 03/10/2022   Primary osteoarthritis of left knee 10/20/2019   Nephrolithiasis 01/15/2016   Palpitations 01/15/2015   Plantar plate injury 11/91/4782   Neuropathic pain of left foot 09/12/2013   WOLFF (WOLFE)-PARKINSON-WHITE (WPW) SYNDROME 09/30/2009   Allergic rhinitis 09/30/2009    Onset date: 11/01/23  REFERRING DIAG:  R49.0 (ICD-10-CM) - Dysphonia    THERAPY DIAG:  Hoarseness  Rationale for Evaluation and Treatment: Rehabilitation  SUBJECTIVE:   SUBJECTIVE STATEMENT: "My throat is a little scratchy now but it's much better (than at time of ENT eval). Pt rates her voice 9/10 today where 10=her normal voice  Pt accompanied by: self  PERTINENT HISTORY: Pt went to ENT in 2023 and was diagnosed with GERD and began Optivia diet at that time. She came down with illness second week of November and lost voice completely second day of the illness. This eventually became PNA requiring hospitalization (20th-23rd of November). Her voice improved considerably on December 8th, and has improved more and more until today. (See "s")  PAIN:  Are you having pain? No  FALLS: Has patient fallen in last 6 months? 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  DIAGNOSTIC FINDINGS:  CLINICAL DATA:  Shortness of breath. EXAM: CHEST - 2 VIEW   11/10/23   COMPARISON:  07/22/2022   FINDINGS: Unchanged cardiac silhouette and mediastinal contours. Interval development of trace bilateral  pleural effusions. No discrete focal airspace opacities. No evidence of edema. No pneumothorax. No acute osseous abnormalities.   IMPRESSION: Interval development of trace bilateral pleural effusions.    Procedure: Flexible fiberoptic laryngoscopy with stroboscopy (16109)   11/29/23 Surgeon: Ashok Croon, MD   Anesthesia: Topical lidocaine and Afrin   Complications: None   Condition is stable throughout exam   Indications and consent:   The patient presents to the clinic with hoarseness. All the risks, benefits, and potential complications were reviewed with the patient preoperatively and informed verbal consent was obtained.   Procedure: The patient was seated upright in the exam chair.   Topical lidocaine and Afrin were applied to the nasal cavity. After adequate anesthesia had occurred, the flexible telescope was passed into the nasal cavity. The nasopharynx was patent without mass or lesion. The scope was passed behind the soft palate and directed toward the base of tongue. The base of tongue was visualized and was symmetric with no apparent masses or abnormal appearing tissue. There were no signs of a mass or pooling of secretions in the piriform sinuses. The supraglottic structures were normal.   The true vocal cords are mobile, atrophic b/l. The medial edges were bowed with thickening mid 1/3 along the edge c/w phonotrauma. Closure was incomplete, spindle-shaped glottic gap. Periodicity present. The mucosal wave and amplitude were normal and intact. There is moderate interarytenoid pachydermia and post cricoid edema.   The laryngoscope was then slowly withdrawn and the patient tolerated the procedure well. There were no complications or blood loss.  COGNITION: Overall cognitive status: Within functional limits for tasks assessed  SOCIAL HISTORY: Occupation: Emergency planning/management officer for [employer] intake: optimal Caffeine/alcohol intake: minimal Daily voice use: moderate  PERCEPTUAL  VOICE ASSESSMENT: Voice quality: hoarse Vocal abuse: habitual throat clearing and abnormal breathing pattern Resonance: normal Respiratory function: thoracic breathing  OBJECTIVE VOICE ASSESSMENT: Maximum phonation time for sustained "ah": 15.2 seconds Conversational pitch average: 220 Hz Conversational pitch range: 107-344 Hz Conversational loudness average: 72 dB Conversational loudness range: 53-84 dB S/z ratio: 0.953 (Suggestive of dysfunction >1.0)  PATIENT REPORTED OUTCOME MEASURES (PROM): V-RQOL:  100 - "excellent", raw score =10, and VHI: 2 = "mild handicap"  TODAY'S TREATMENT:                                                                                                                                         DATE:  12/01/23 (eval): PhoRTE exercises introduced and rationale provided. SLP's role in ST and rationale for BID ST. SLP strongly encouraged  pt to reduce throat clearing, informing her she had 31 clears today and two coughs. Laura Wheeler exhibited thoracic breathing during the evaluation today and SLP explained why this was not optimal for pt. Lastly, SLP educated pt how to perform  PhoRTE loud /a/ task.  PATIENT EDUCATION: Education details: see "Today's treatment" Person educated: Patient Education method: Explanation, Demonstration, Verbal cues, and Handouts Education comprehension: verbalized understanding, returned demonstration, verbal cues required, and needs further education  HOME EXERCISE PROGRAM: 10 strong /a/  GOALS: Goals reviewed with patient? Yes  SHORT TERM GOALS: Target date: 01/07/24  Pt will complete PhoRTE exercises with rare min A in two sessions Baseline: Goal status: INITIAL  2.  Pt will reduce throat clears to 10 in 2 sessions Baseline:  Goal status: INITIAL  3.  Pt will use AB 80% in paragraph reading tasks  Baseline:  Goal status: INITIAL  4.  Pt will use AB 80% in sentence response tasks  Baseline:  Goal status: INITIAL   LONG  TERM GOALS: Target date: 02/04/24  Pt will reduce VHI to 0 Baseline:  Goal status: INITIAL  2.  Pt will complete PhoRTE exercises with rare min A in two sessions Baseline:  Goal status: INITIAL  3.  Pt will reduce throat clears to 5 or less in 2 sessions Baseline:  Goal status: INITIAL  4.  Pt will use AB 75% in 10 minutes mod complex conversation in 3 sessions Baseline:  Goal status: INITIAL   ASSESSMENT:  CLINICAL IMPRESSION: Patient is a 62 y.o. F who was seen today for assessment of voice due to dx of vocal fold atrophy, signs of phonotrauma, and sx of chronic throat clearing (31 clears and two coughs in this ~40 minute evaluation. See "today's treatment" for more details about diagnostic therapy today. Pt will cont PhoRTE /a/ at home x10, BID.    OBJECTIVE IMPAIRMENTS: include voice disorder. These impairments are limiting patient from household responsibilities, ADLs/IADLs, and effectively communicating at home and in community. Factors affecting potential to achieve goals and functional outcome are  none noted today .Marland Kitchen Patient will benefit from skilled SLP services to address above impairments and improve overall function.  REHAB POTENTIAL: Excellent  PLAN:  SLP FREQUENCY: 1-2x/week  SLP DURATION: 8 weeks  PLANNED INTERVENTIONS: 92507 Treatment of speech (30 or 45 min) , Environmental controls, Internal/external aids, Functional tasks, SLP instruction and feedback, Compensatory strategies, Patient/family education, and PhoRTE exercises    Jeanmarie Mccowen, CCC-SLP 12/01/2023, 11:23 PM

## 2023-12-01 NOTE — Patient Instructions (Signed)

## 2023-12-07 ENCOUNTER — Ambulatory Visit: Payer: 59

## 2023-12-09 ENCOUNTER — Ambulatory Visit: Payer: 59

## 2023-12-09 DIAGNOSIS — R490 Dysphonia: Secondary | ICD-10-CM

## 2023-12-09 NOTE — Therapy (Signed)
OUTPATIENT SPEECH LANGUAGE PATHOLOGY VOICE TREATMENT   Patient Name: Laura Wheeler MRN: [id number] DOB:[birth year], 62 y.o., female Today's Date: 12/09/2023  PCP: Evelena Peat, MD REFERRING PROVIDER: Ashok Croon, MD  END OF SESSION:  End of Session - 12/09/23 2217     Visit Number 2    Number of Visits 13    Date for SLP Re-Evaluation 02/04/24    SLP Start Time 1405    SLP Stop Time  1446    SLP Time Calculation (min) 41 min    Activity Tolerance Patient tolerated treatment well              Past Medical History:  Diagnosis Date   Arthritis    "knees" (05/19/2013)   Bronchitis    History of cardiac radiofrequency ablation 04/20/2013   Hx of echocardiogram    Echo (12/15):  EF 60-65%, no RWMA, Gr 1 DD   SVT (supraventricular tachycardia) (HCC)    WPW (Wolff-Parkinson-White syndrome) ~ 1983   a. s/p ablation 05/20/13.   Past Surgical History:  Procedure Laterality Date   ANAL FISSURE REPAIR  1980's   BREAST BIOPSY Left 2000's   EXCISION MORTON'S NEUROMA Left 05/04/2013   Procedure: LEFT SECOND MT  WEIL/SECOND WEBB SPACE NEUROMA EXCISION ;  Surgeon: [name], MD;  Location: [facility] OR;  Service: Orthopedics;  Laterality: Left;   FOOT SURGERY Left 09/2014   hardware removal   SUPRAVENTRICULAR TACHYCARDIA ABLATION  05/19/2013   SUPRAVENTRICULAR TACHYCARDIA ABLATION N/A 05/19/2013   Procedure: SUPRAVENTRICULAR TACHYCARDIA ABLATION;  Surgeon: Marinus Maw, MD;  Location[facility] CATH LAB;  Service: Cardiovascular;  Laterality: N/A;   TOTAL KNEE ARTHROPLASTY Right 03/10/2022   Procedure: Right TOTAL KNEE ARTHROPLASTY;  Surgeon: Kathryne Hitch, MD;  Location: [facility] OR;  Service: Orthopedics;  Laterality: Right;   Patient Active Problem List   Diagnosis Date Noted   Acute respiratory failure with hypoxia (HCC) 11/13/2023   Mucus plugging of bronchi 11/11/2023   Obstructive pneumonia 11/11/2023   Pneumonia 11/10/2023   Arthrofibrosis of knee joint, right  04/20/2022   Unilateral primary osteoarthritis, right knee 03/10/2022   DJD (degenerative joint disease) 03/10/2022   Status post total right knee replacement 03/10/2022   Primary osteoarthritis of left knee 10/20/2019   Nephrolithiasis 01/15/2016   Palpitations 01/15/2015   Plantar plate injury 40/98/1191   Neuropathic pain of left foot 09/12/2013   WOLFF (WOLFE)-PARKINSON-WHITE (WPW) SYNDROME 09/30/2009   Allergic rhinitis 09/30/2009    Onset date: 11/01/23  REFERRING DIAG:  R49.0 (ICD-10-CM) - Dysphonia    THERAPY DIAG:  Hoarseness  Rationale for Evaluation and Treatment: Rehabilitation  SUBJECTIVE:   SUBJECTIVE STATEMENT: "I had coffee the other day but didn't have my GERD medicine. That may have been why it was so bad with my throat clearing."  Pt accompanied by: self  PERTINENT HISTORY: Pt went to ENT in 2023 and was diagnosed with GERD and began Optivia diet at that time. She came down with illness second week of November and lost voice completely second day of the illness. This eventually became PNA requiring hospitalization (20th-23rd of November). Her voice improved considerably on December 8th, and has improved more and more until today. (See "s")  PAIN:  Are you having pain? No  FALLS: Has patient fallen in last 6 months? 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  DIAGNOSTIC FINDINGS:  CLINICAL DATA:  Shortness of breath. EXAM: CHEST - 2 VIEW   11/10/23   COMPARISON:  07/22/2022   FINDINGS: Unchanged cardiac silhouette and mediastinal contours. Interval development of trace bilateral pleural  effusions. No discrete focal airspace opacities. No evidence of edema. No pneumothorax. No acute osseous abnormalities.   IMPRESSION: Interval development of trace bilateral pleural effusions.    Procedure: Flexible fiberoptic laryngoscopy with stroboscopy (40981)   11/29/23 Surgeon: Ashok Croon, MD   Anesthesia: Topical lidocaine and Afrin   Complications: None   Condition is stable throughout exam   Indications and consent:   The patient presents to the clinic with hoarseness. All the risks, benefits, and potential complications were reviewed with the patient preoperatively and informed verbal consent was obtained.   Procedure: The patient was seated upright in the exam chair.   Topical lidocaine and Afrin were applied to the nasal cavity. After adequate anesthesia had occurred, the flexible telescope was passed into the nasal cavity. The nasopharynx was patent without mass or lesion. The scope was passed behind the soft palate and directed toward the base of tongue. The base of tongue was visualized and was symmetric with no apparent masses or abnormal appearing tissue. There were no signs of a mass or pooling of secretions in the piriform sinuses. The supraglottic structures were normal.   The true vocal cords are mobile, atrophic b/l. The medial edges were bowed with thickening mid 1/3 along the edge c/w phonotrauma. Closure was incomplete, spindle-shaped glottic gap. Periodicity present. The mucosal wave and amplitude were normal and intact. There is moderate interarytenoid pachydermia and post cricoid edema.   The laryngoscope was then slowly withdrawn and the patient tolerated the procedure well. There were no complications or blood loss.   PATIENT REPORTED OUTCOME MEASURES (PROM): V-RQOL:  100 - "excellent", raw score =10, and VHI: 2 = "mild handicap"  TODAY'S TREATMENT:                                                                                                                                          DATE:  12/09/23: Pt with 7 throat clears today during entire session; much improved from evaluation. SLP instructed pt with all PhoRTE exercises today; SLP req'd pt to use "h" initial to begin /a/ to mitigate harsh vocal onset. She req'd tactile cues initially for abdominal recruitment with these exercises but was independent after this initial cue. Numbers were 10% with strained voice at upper register but did not persist other than one rep. Good pitch range today with model, however with higher frequencies pt notably louder than at lower frequencies - by end of task pt had more equal volume with upper and lower registers. Sentences with high pitch with model  req'd initial mod A for volume, and authoritative sentences req'd cues for lower pitch initially, and improved volume. Pt should perform well at home.  12/01/23 (eval): PhoRTE exercises introduced and rationale provided. SLP's role in ST and rationale for BID ST. SLP strongly encouraged pt to reduce throat clearing, informing her she had 31 clears today and two coughs. Zura exhibited thoracic breathing during the evaluation today and SLP explained why this was not optimal for pt. Lastly, SLP educated pt how to perform  PhoRTE loud /a/ task.  PATIENT EDUCATION: Education details: see "Today's treatment" Person educated: Patient Education method: Explanation, Demonstration, Verbal cues, and Handouts Education comprehension: verbalized understanding, returned demonstration, verbal cues required, and needs further education  HOME EXERCISE PROGRAM: PhoRTE voice program at home with YouTube support PRN.  GOALS: Goals reviewed with patient? Yes  SHORT TERM GOALS: Target date: 01/07/24  Pt will complete PhoRTE exercises with rare min A in two sessions Baseline: Goal status: INITIAL  2.  Pt will reduce throat clears to 10 in 2 sessions Baseline:  Goal status: INITIAL  3.  Pt will use AB 80% in paragraph reading  tasks  Baseline:  Goal status: INITIAL  4.  Pt will use AB 80% in sentence response tasks  Baseline:  Goal status: INITIAL   LONG TERM GOALS: Target date: 02/04/24  Pt will reduce VHI to 0 Baseline:  Goal status: INITIAL  2.  Pt will complete PhoRTE exercises with rare min A in two sessions Baseline:  Goal status: INITIAL  3.  Pt will reduce throat clears to 5 or less in 2 sessions Baseline:  Goal status: INITIAL  4.  Pt will use AB 75% in 10 minutes mod complex conversation in 3 sessions Baseline:  Goal status: INITIAL   ASSESSMENT:  CLINICAL IMPRESSION: Patient is a 62 y.o. F who was seen today for treatment of voice due to dx of vocal fold atrophy, signs of phonotrauma, and sx of chronic throat clearing. Pt did much better today with throat clearing, only seven instances. See "today's treatment" for more details about diagnostic therapy today.     OBJECTIVE IMPAIRMENTS: include voice disorder. These impairments are limiting patient from household responsibilities, ADLs/IADLs, and effectively communicating at home and in community. Factors affecting potential to achieve goals and functional outcome are  none noted today .Marland Kitchen Patient will benefit from skilled SLP services to address above impairments and improve overall function.  REHAB POTENTIAL: Excellent  PLAN:  SLP FREQUENCY: 1-2x/week  SLP DURATION: 8 weeks  PLANNED INTERVENTIONS: 92507 Treatment of speech (30 or 45 min) , Environmental controls, Internal/external aids, Functional tasks, SLP instruction and feedback, Compensatory strategies, Patient/family education, and PhoRTE exercises    Isra Lindy, CCC-SLP 12/09/2023, 10:17 PM

## 2023-12-09 NOTE — Patient Instructions (Signed)
    PHoRTE VOICE EXERCISES - DO TWICE EACH DAY  1) Hold "[place]" 10 times   As long as you can as strong as you can - push from your abdominal muscles!   [email or web address]  2) Count 1-10 (skip 7)   Start at as low pitch as you can, glide to as high pitch as you can, then back down as low as you can   [email or web address]  3) Say 10 sentences -two different ways   (a) like you are calling over the fence to your neighbor in a higher-pitch voice  (b) like you are scolding your dog in a LOW authoritative voice   [email or web address]

## 2023-12-14 ENCOUNTER — Ambulatory Visit: Payer: 59

## 2023-12-14 DIAGNOSIS — R490 Dysphonia: Secondary | ICD-10-CM

## 2023-12-14 NOTE — Therapy (Signed)
OUTPATIENT SPEECH LANGUAGE PATHOLOGY VOICE TREATMENT   Patient Name: Laura Wheeler MRN: [id number] DOB:06-16-1961, 62 y.o., female Today's Date: 12/14/2023  PCP: Evelena Peat, MD REFERRING PROVIDER: Ashok Croon, MD  END OF SESSION:  End of Session - 12/14/23 0915     Visit Number 3    Number of Visits 13    Date for SLP Re-Evaluation 02/04/24    SLP Start Time 0850    SLP Stop Time  0930    SLP Time Calculation (min) 40 min    Activity Tolerance Patient tolerated treatment well               Past Medical History:  Diagnosis Date   Arthritis    "knees" (05/19/2013)   Bronchitis    History of cardiac radiofrequency ablation 04/20/2013   Hx of echocardiogram    Echo (12/15):  EF 60-65%, no RWMA, Gr 1 DD   SVT (supraventricular tachycardia) (HCC)    WPW (Wolff-Parkinson-White syndrome) ~ 1983   a. s/p ablation 05/20/13.   Past Surgical History:  Procedure Laterality Date   ANAL FISSURE REPAIR  1980's   BREAST BIOPSY Left 2000's   EXCISION MORTON'S NEUROMA Left 05/04/2013   Procedure: LEFT SECOND MT  WEIL/SECOND WEBB SPACE NEUROMA EXCISION ;  Surgeon: [name], MD;  Location: [facility] OR;  Service: Orthopedics;  Laterality: Left;   FOOT SURGERY Left 09/2014   hardware removal   SUPRAVENTRICULAR TACHYCARDIA ABLATION  05/19/2013   SUPRAVENTRICULAR TACHYCARDIA ABLATION N/A 05/19/2013   Procedure: SUPRAVENTRICULAR TACHYCARDIA ABLATION;  Surgeon: Marinus Maw, MD;  Location: [facility] CATH LAB;  Service: Cardiovascular;  Laterality: N/A;   TOTAL KNEE ARTHROPLASTY Right 03/10/2022   Procedure: Right TOTAL KNEE ARTHROPLASTY;  Surgeon: Kathryne Hitch, MD;  Location: [facility] OR;  Service: Orthopedics;  Laterality: Right;   Patient Active Problem List   Diagnosis Date Noted   Acute respiratory failure with hypoxia (HCC) 11/13/2023   Mucus plugging of bronchi 11/11/2023   Obstructive pneumonia 11/11/2023   Pneumonia 11/10/2023   Arthrofibrosis of knee joint, right  04/20/2022   Unilateral primary osteoarthritis, right knee 03/10/2022   DJD (degenerative joint disease) 03/10/2022   Status post total right knee replacement 03/10/2022   Primary osteoarthritis of left knee 10/20/2019   Nephrolithiasis 01/15/2016   Palpitations 01/15/2015   Plantar plate injury 12/23/7251   Neuropathic pain of left foot 09/12/2013   WOLFF (WOLFE)-PARKINSON-WHITE (WPW) SYNDROME 09/30/2009   Allergic rhinitis 09/30/2009    Onset date: 11/01/23  REFERRING DIAG:  R49.0 (ICD-10-CM) - Dysphonia    THERAPY DIAG:  Hoarseness  Rationale for Evaluation and Treatment: Rehabilitation  SUBJECTIVE:   SUBJECTIVE STATEMENT: "The GERD is back pretty strong..I didn't want to be the one saying 'I can't eat that,"' so I just ate it."  Pt accompanied by: self  PERTINENT HISTORY: Pt went to ENT in 2023 and was diagnosed with GERD and began Optivia diet at that time. She came down with illness second week of November and lost voice completely second day of the illness. This eventually became PNA requiring hospitalization (20th-23rd of November). Her voice improved considerably on December 8th, and has improved more and more until today. (See "s")  PAIN:  Are you having pain? No  FALLS: Has patient fallen in last 6 months? 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  DIAGNOSTIC FINDINGS:  CLINICAL DATA:  Shortness of breath. EXAM: CHEST - 2 VIEW   11/10/23   COMPARISON:  07/22/2022   FINDINGS: Unchanged cardiac silhouette and mediastinal contours. Interval development of trace bilateral pleural effusions. No discrete  focal airspace opacities. No evidence of edema. No pneumothorax. No acute osseous abnormalities.   IMPRESSION: Interval development of trace bilateral pleural effusions.    Procedure: Flexible fiberoptic laryngoscopy with stroboscopy (16109)   11/29/23 Surgeon: Ashok Croon, MD   Anesthesia: Topical lidocaine and Afrin   Complications: None   Condition is stable throughout exam   Indications and consent:   The patient presents to the clinic with hoarseness. All the risks, benefits, and potential complications were reviewed with the patient preoperatively and informed verbal consent was obtained.   Procedure: The patient was seated upright in the exam chair.   Topical lidocaine and Afrin were applied to the nasal cavity. After adequate anesthesia had occurred, the flexible telescope was passed into the nasal cavity. The nasopharynx was patent without mass or lesion. The scope was passed behind the soft palate and directed toward the base of tongue. The base of tongue was visualized and was symmetric with no apparent masses or abnormal appearing tissue. There were no signs of a mass or pooling of secretions in the piriform sinuses. The supraglottic structures were normal.   The true vocal cords are mobile, atrophic b/l. The medial edges were bowed with thickening mid 1/3 along the edge c/w phonotrauma. Closure was incomplete, spindle-shaped glottic gap. Periodicity present. The mucosal wave and amplitude were normal and intact. There is moderate interarytenoid pachydermia and post cricoid edema.   The laryngoscope was then slowly withdrawn and the patient tolerated the procedure well. There were no complications or blood loss.   PATIENT REPORTED OUTCOME MEASURES (PROM): V-RQOL:  100 - "excellent", raw score =10, and VHI: 2 = "mild handicap"  TODAY'S TREATMENT:                                                                                                                                          DATE:  12/14/23: SLP reviewed throat clear alternatives as pt entered performing many throat clears, she thinks d/t GERD. Pt is very eager to see her GI MD. SLP frequently cued pt throughout session for decr'ing throat clearing, with some success in the last 10 minutes. SLP reiterated rationale for limiting throat clearing.  SLP taught pt abdominal breathing (AB) and pt was encouraged to practice AB 15 minutes twice a day. Lastly, SLP worked with pt on PhoRTE; pt's completion was incomplete as she misplaced her exercise sheet. Today she completed exercises with occasional min A.   12/09/23: Pt with 7 throat clears today during entire session; much improved from evaluation. SLP  instructed pt with all PhoRTE exercises today; SLP req'd pt to use "h" initial to begin /a/ to mitigate harsh vocal onset. She req'd tactile cues initially for abdominal recruitment with these exercises but was independent after this initial cue. Numbers were 10% with strained voice at upper register but did not persist other than one rep. Good pitch range today with model, however with higher frequencies pt notably louder than at lower frequencies - by end of task pt had more equal volume with upper and lower registers. Sentences with high pitch with model req'd initial mod A for volume, and authoritative sentences req'd cues for lower pitch initially, and improved volume. Pt should perform well at home.  12/01/23 (eval): PhoRTE exercises introduced and rationale provided. SLP's role in ST and rationale for BID ST. SLP strongly encouraged pt to reduce throat clearing, informing her she had 31 clears today and two coughs. Annesha exhibited thoracic breathing during the evaluation today and SLP explained why this was not optimal for pt. Lastly, SLP educated pt how to perform  PhoRTE loud /a/ task.  PATIENT EDUCATION: Education details: see "Today's treatment" Person educated: Patient Education method: Explanation, Demonstration,  Verbal cues, and Handouts Education comprehension: verbalized understanding, returned demonstration, verbal cues required, and needs further education  HOME EXERCISE PROGRAM: PhoRTE voice program at home with YouTube support PRN.  GOALS: Goals reviewed with patient? Yes  SHORT TERM GOALS: Target date: 01/07/24  Pt will complete PhoRTE exercises with rare min A in two sessions Baseline: Goal status: INITIAL  2.  Pt will reduce throat clears to 10 in 2 sessions Baseline:  Goal status: INITIAL  3.  Pt will use AB 80% in paragraph reading tasks  Baseline:  Goal status: INITIAL  4.  Pt will use AB 80% in sentence response tasks  Baseline:  Goal status: INITIAL   LONG TERM GOALS: Target date: 02/04/24  Pt will reduce VHI to 0 Baseline:  Goal status: INITIAL  2.  Pt will complete PhoRTE exercises with rare min A in two sessions Baseline:  Goal status: INITIAL  3.  Pt will reduce throat clears to 5 or less in 2 sessions Baseline:  Goal status: INITIAL  4.  Pt will use AB 75% in 10 minutes mod complex conversation in 3 sessions Baseline:  Goal status: INITIAL   ASSESSMENT:  CLINICAL IMPRESSION: Patient is a 62 y.o. F who was seen today for treatment of voice due to dx of vocal fold atrophy, signs of phonotrauma, and sx of chronic throat clearing. See "today's treatment" for more details about diagnostic therapy today.     OBJECTIVE IMPAIRMENTS: include voice disorder. These impairments are limiting patient from household responsibilities, ADLs/IADLs, and effectively communicating at home and in community. Factors affecting potential to achieve goals and functional outcome are  none noted today .Marland Kitchen Patient will benefit from skilled SLP services to address above impairments and improve overall function.  REHAB POTENTIAL: Excellent  PLAN:  SLP FREQUENCY: 1-2x/week  SLP DURATION: 8 weeks  PLANNED INTERVENTIONS: 92507 Treatment of speech (30 or 45 min) , Environmental  controls, Internal/external aids, Functional tasks, SLP instruction and feedback, Compensatory strategies, Patient/family education, and PhoRTE exercises    Mazell Aylesworth, CCC-SLP 12/14/2023, 9:16 AM

## 2023-12-17 ENCOUNTER — Encounter: Payer: Self-pay | Admitting: Dietician

## 2023-12-17 ENCOUNTER — Encounter: Payer: 59 | Attending: Family Medicine | Admitting: Dietician

## 2023-12-17 DIAGNOSIS — K219 Gastro-esophageal reflux disease without esophagitis: Secondary | ICD-10-CM | POA: Diagnosis present

## 2023-12-17 NOTE — Progress Notes (Signed)
Medical Nutrition Therapy  Appointment Start time:  [phone]  Appointment End time:  0940  Primary concerns today: GERD  Referral diagnosis: GERD (K21.9) Preferred learning style: no preference indicated (auditory, visual, hands on, no preference indicated) Learning readiness: ready (not ready, contemplating, ready, change in progress)  NUTRITION ASSESSMENT   Clinical Medical Hx: Asthma, GERD Medications: Azelastine-fluticasone, montelukast sodium, famotidine, levocetirizine, wixela Labs: see EMR Notable Signs/Symptoms: none noted  Lifestyle & Dietary Hx  Pt states she works at [employer]. Pt states she has been diagnosed with GERD, stating she does not have the burning. Pt states she does not have reflux at night. Pt states her voice sounds different after she eats. Pt states this has all started about 2020 or 2021. Pt states she is going to see her gastroenterologist (Dr Dulce Sellar) for a consultation next month (January 13th) Pt states she went on [employer], stating its program focuses on small frequent meals during the day. Pt states she was told to take a "reflux gourmet" which coats the top of her food in her stomach and minimizes reflux. Pt states she has been getting allergy shots every week or two, stating she is now on every two weeks. Pt states she is allergic to animals/fur. Pt states she cooks about twice a week in cast iron skillets, stating she used to use cast iron more, until she started the [employer]. Pt states she does not want to continue the Optavia diet forever. Pt states she does not eat out much.  Estimated daily fluid intake: 100 oz Supplements: multivitamin and calcium (Shakely brand) Sleep: really good, stating she has a sleep number bed. Pt states she goes to bed about 10 and wakes at 6:30 Stress / self-care: not much stress (2 on a scale of 1-10); pilates for stress. Current average weekly physical activity: pilates 5 days a week, 50-55 minutes each time  24-Hr  Dietary Recall (prior to [place]) First Meal: coffee  Snack: yogurt with a small amount of berries, granola in the same single serve cup Second Meal: restaurant Snack: trail mix or lucky charms cereal or goldfish Third Meal: salmon, sweet potato, vegetable Snack: popcorn Beverages: water, hot herbal tea, throat coat  NUTRITION DIAGNOSIS  [place]-1.4 Altered GI function As related to diagnosis of GERD.  As evidenced by reflux, pressure in esophagus and voice changes.  NUTRITION INTERVENTION  Nutrition education (E-1) on the following topics:  Encouraged patient to honor their body's internal hunger and fullness cues.  Throughout the day, check in mentally and rate hunger. Stop eating when satisfied not full regardless of how much food is left on the plate.  Get more if still hungry 20-30 minutes later.  The key is to honor satisfaction so throughout the meal, rate fullness factor and stop when comfortably satisfied not physically full. The key is to honor hunger and fullness without any feelings of guilt or shame.  Pay attention to what the internal cues are, rather than any external factors. This will enhance the confidence you have in listening to your own body and following those internal cues enabling you to increase how often you eat when you are hungry not out of appetite and stop when you are satisfied not full.  Encouraged pt to continue to eat balanced meals inclusive of non starchy vegetables 2 times a day 7 days a week Encouraged pt to choose lean protein sources: limiting beef, pork, sausage, hotdogs, and lunch meat Encourage pt to choose healthy fats such as plant  based limiting animal fats Encouraged pt to continue to drink a minium 64 fluid ounces with half being plain water to satisfy proper hydration   Exercise at least three or four times each week. Wear loose-fitting clothes. Do not smoke. Raise the head of your bed 6 to 9 inches. You can put a foam wedge under the top part of the  mattress or prop up the legs on the head of the bed with wooden blocks. (Stacking pillows is not effective.) Wait 3 hours after eating before lying down. Eat several small meals throughout the day. Eat in a calm, relaxed place. Sit down while you eat.  Handouts Provided Include  Academy of Nutrition and Dietetics patient education for GERD Meal Ideas  Learning Style & Readiness for Change Teaching method utilized: Visual & Auditory  Demonstrated degree of understanding via: Teach Back  Barriers to learning/adherence to lifestyle change: nothing identified  Goals Established by Pt Eat several small meals throughout the day with whole foods (replacing Optavia products).  MONITORING & EVALUATION Dietary intake, weekly physical activity.  Next Steps  Patient will follow-up after visit with gastroenterologist as necessary.

## 2023-12-20 ENCOUNTER — Encounter: Payer: Self-pay | Admitting: Pulmonary Disease

## 2023-12-20 ENCOUNTER — Ambulatory Visit: Payer: 59 | Admitting: Pulmonary Disease

## 2023-12-20 VITALS — BP 104/68 | HR 87 | Ht 66.0 in | Wt 149.8 lb

## 2023-12-20 DIAGNOSIS — R059 Cough, unspecified: Secondary | ICD-10-CM

## 2023-12-20 DIAGNOSIS — J189 Pneumonia, unspecified organism: Secondary | ICD-10-CM | POA: Diagnosis not present

## 2023-12-20 NOTE — Progress Notes (Signed)
Laura Wheeler    [id number]    [birth year]  Primary Care Physician:Burchette, Elberta Fortis, MD  Referring Physician: Kristian Covey, MD [address] [place],  [place] [address]  Chief complaint:   Cough, wheezing, shortness of breath Recurrent bronchitis  HPI:  Was recently hospitalized for pneumonia Had started having symptoms about November 10 and, was given a course of prednisone Repeat course of prednisone was given Visited urgent care when she was in [place] Was in the ED November 20 at -Diagnosed with a pneumonia  CT scan of the chest reviewed with the patient showing bibasal infiltrates  She did see ENT, continues to follow-up with ENT-Dr. 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   Azelastine-Fluticasone 137-50 MCG/ACT SUSP Place 1 spray into both nostrils 2 (two) times daily.   chlorpheniramine (CHLOR-TRIMETON) 4 MG tablet Take 4 mg by mouth 2 (two) times daily as needed (for seasonal allergies).   EPINEPHrine 0.3 mg/0.3 mL IJ SOAJ injection Inject 0.3 mg into the muscle as needed for anaphylaxis.   famotidine (PEPCID) 20 MG tablet Take 1 tablet (20 mg total) by mouth 2 (two) times daily.   levocetirizine (XYZAL) 5 MG tablet Take 5 mg by mouth at bedtime. (Patient not taking: Reported on 12/01/2023)   montelukast (SINGULAIR) 10 MG tablet TAKE 1 TABLET BY MOUTH EVERYDAY AT BEDTIME (Patient taking differently: Take 10 mg  by mouth at bedtime.)   Multiple Vitamin (MULTIVITAMINS PO) Take 1 packet by mouth See admin instructions. Shaklee Vita pack multivitamins- Take 1 packet by mouth once a day   OVER THE COUNTER MEDICATION Take 2 tablets by mouth See admin instructions. Shaklee Advance Joint Health- Take 2 tablets by mouth once a day   OVER THE COUNTER MEDICATION Take 1 tablet by mouth See admin instructions. Shaklee supplement for vitamin C- Take 1 tablet by mouth once a day   OVER THE COUNTER MEDICATION Take 5 tablets by mouth See admin instructions. Shaklee Osteomatrix- Take 5 tablets by mouth once a day   pantoprazole (PROTONIX) 40 MG tablet TAKE 1 TABLET BY MOUTH EVERY DAY (Patient not taking: Reported on 12/01/2023)   Protein POWD Take 2 Scoops by mouth See admin instructions. Shaklee life shake + collagen- Mix 2 scoopsful of powder into a desired beverage and drink by mouth once a day   TURMERIC PO Take 1 capsule by mouth See admin instructions. Shaklee- Take 1 capsule by mouth once a day   valACYclovir (VALTREX) 500 MG tablet Take 500 mg by mouth daily as needed (for flares of fever blisters).   WIXELA INHUB 250-50 MCG/ACT AEPB INHALE 1 PUFF INTO THE LUNGS IN THE MORNING AND AT BEDTIME. (Patient taking differently: Inhale 1 puff into the lungs at bedtime.)   XYZAL ALLERGY 24HR 5 MG tablet Take 5 mg by mouth at bedtime.   No facility-administered encounter medications on file as of 12/20/2023.  Allergies as of 12/20/2023 - Review Complete 12/20/2023  Allergen Reaction Noted   Dog epithelium (canis lupus familiaris) Shortness Of Breath, Itching, Other (See Comments), and Cough 04/07/1961   Horse epithelium Shortness Of Breath, Other (See Comments), and Cough 1961-03-20   Oxycodone Nausea Only 05/11/2022   Sulfa antibiotics Itching 05/01/2013    Past Medical History:  Diagnosis Date   Arthritis    "knees" (05/19/2013)   Bronchitis    History of cardiac radiofrequency ablation 04/20/2013   Hx of  echocardiogram    Echo (12/15):  EF 60-65%, no RWMA, Gr 1 DD   SVT (supraventricular tachycardia) (HCC)    WPW (Wolff-Parkinson-White syndrome) ~ 1983   a. s/p ablation 05/20/13.    Past Surgical History:  Procedure Laterality Date   ANAL FISSURE REPAIR  1980's   BREAST BIOPSY Left 2000's   EXCISION MORTON'S NEUROMA Left 05/04/2013   Procedure: LEFT SECOND MT  WEIL/SECOND WEBB SPACE NEUROMA EXCISION ;  Surgeon: [name], MD;  Location: [facility] OR;  Service: Orthopedics;  Laterality: Left;   FOOT SURGERY Left 09/2014   hardware removal   SUPRAVENTRICULAR TACHYCARDIA ABLATION  05/19/2013   SUPRAVENTRICULAR TACHYCARDIA ABLATION N/A 05/19/2013   Procedure: SUPRAVENTRICULAR TACHYCARDIA ABLATION;  Surgeon: Marinus Maw, MD;  [facility] C[facility] LAB;  Service: Cardiovascular;  Laterality: N/A;   TOTAL KNEE ARTHROPLASTY Right 03/10/2022   Procedure: Right TOTAL KNEE ARTHROPLASTY;  Surgeon: Kathryne Hitch, MD;  Location: [facility] OR;  Service: Orthopedics;  Laterality: Right;    Family History  Problem Relation Age of Onset   Diabetes Mother    Depression Mother    Melanoma Mother    Hypothyroidism Mother    Hyperlipidemia Mother    Cancer Father        ?osteosarcoma leg   Hypertension Father    Diabetes Brother    Arthritis Brother    Heart attack Neg Hx     Social History   Socioeconomic History   Marital status: Married    Spouse name: Loraine Leriche   Number of children: 2   Years of education: BS   Highest education level: Bachelor's degree (e.g., BA, AB, BS)  Occupational History    Employer: [employer]    Comment: [place]  Tobacco Use   Smoking status: Never   Smokeless tobacco: Never  Vaping Use   Vaping status: Never Used  Substance and Sexual Activity   Alcohol use: Not Currently    Comment: 1-2 glasses occassionaly   Drug use: No   Sexual activity: Yes    Birth control/protection: Inserts  Other Topics Concern   Not on file  Social History Narrative   Patient is  married and lives at home with her husband.     New job in 2017, Civil Service fast streamer   No regular exercise - diet is ok.   Social Drivers of Corporate investment banker Strain: Low Risk  (11/03/2023)   Overall Financial Resource Strain (CARDIA)    Difficulty of Paying Living Expenses: Not hard at all  Food Insecurity: No Food Insecurity (11/11/2023)   Hunger Vital Sign    Worried About Running Out of Food in the Last Year: Never true    Ran Out of Food in the Last Year: Never true  Transportation Needs: No Transportation Needs (11/03/2023)   PRAPARE - Administrator, Civil Service (Medical): No    Lack of Transportation (Non-Medical): No  Physical Activity: Sufficiently Active (11/03/2023)   Exercise Vital  Sign    Days of Exercise per Week: 5 days    Minutes of Exercise per Session: 50 min  Stress: No Stress Concern Present (11/03/2023)   [employer] of Occupational Health - Occupational Stress Questionnaire    Feeling of Stress : Only a little  Social Connections: Socially Integrated (11/03/2023)   Social Connection and Isolation Panel [NHANES]    Frequency of Communication with Friends and Family: More than three times a week    Frequency of Social Gatherings with Friends and Family: Once a week    Attends Religious Services: More than 4 times per year    Active Member of [employer] or Organizations: Yes    Attends Banker Meetings: Not on file    Marital Status: Married  Intimate Partner Violence: Not At Risk (11/11/2023)   Humiliation, Afraid, Rape, and Kick questionnaire    Fear of Current or Ex-Partner: No    Emotionally Abused: No    Physically Abused: No    Sexually Abused: No    Review of Systems  Respiratory:  Positive for cough and shortness of breath. Negative for wheezing.     Vitals:   12/20/23 1142  BP: 104/68  Pulse: 87  SpO2: 97%     Physical Exam Constitutional:      Appearance: Normal appearance.  HENT:      Head: Normocephalic.     Mouth/Throat:     Mouth: Mucous membranes are moist.  Eyes:     Pupils: Pupils are equal, round, and reactive to light.  Cardiovascular:     Rate and Rhythm: Normal rate and regular rhythm.     Heart sounds: No murmur heard.    No friction rub.  Pulmonary:     Effort: No respiratory distress.     Breath sounds: No stridor. No wheezing or rhonchi.  Musculoskeletal:     Cervical back: No rigidity or tenderness.  Neurological:     Mental Status: She is alert.  Psychiatric:        Mood and Affect: Mood normal.    Data Reviewed: Recent chest x-ray reviewed  CT scan of the chest was reviewed with the patient showing bibasal infiltrates  Platelet trend was reviewed showing thrombocytosis  Assessment:  Recent pneumonia with bibasal infiltrate on CT scan -Will plan to repeat CT scan second week in January  Continue antireflux medications  Continue following up with ENT  Continues to use Wixela just once a day, intermittent use of albuterol  Plan/Recommendations:  CT scan of the chest to follow-up infiltrate to resolution  Follow-up with ENT  Tentative follow-up in about 3 months but this may be rescheduled if CT scan does show significant clearing  Encouraged to call with significant concerns   Virl Diamond MD [facility] 12/20/2023, 11:49 AM  CC: Kristian Covey, MD

## 2023-12-20 NOTE — Patient Instructions (Signed)
CT scan of the chest without contrast to be done second week of January -To follow-up on the pneumonia to make sure it clears up  Continue other treatments  Tentative follow-up in 3 months, if CAT scan is unremarkable, may be rescheduled for year  Call us with significant concerns

## 2023-12-23 ENCOUNTER — Ambulatory Visit: Payer: 59 | Attending: Otolaryngology

## 2023-12-23 DIAGNOSIS — R490 Dysphonia: Secondary | ICD-10-CM | POA: Insufficient documentation

## 2023-12-23 NOTE — Therapy (Signed)
 OUTPATIENT SPEECH LANGUAGE PATHOLOGY VOICE TREATMENT   Patient Name: Laura Wheeler MRN: [id number] DOB:May 05, 1961, 63 y.o., female Today's Date: 12/23/2023  PCP: Micheal Pin, MD REFERRING PROVIDER: Okey Burns, MD  END OF SESSION:  End of Session - 12/23/23 1721     Visit Number 4    Number of Visits 13    Date for SLP Re-Evaluation 02/04/24    SLP Start Time 1619    SLP Stop Time  1655    SLP Time Calculation (min) 36 min    Activity Tolerance Patient tolerated treatment well                Past Medical History:  Diagnosis Date   Arthritis    knees (05/19/2013)   Bronchitis    History of cardiac radiofrequency ablation 04/20/2013   Hx of echocardiogram    Echo (12/15):  EF 60-65%, no RWMA, Gr 1 DD   SVT (supraventricular tachycardia) (HCC)    WPW (Wolff-Parkinson-White syndrome) ~ 1983   a. s/p ablation 05/20/13.   Past Surgical History:  Procedure Laterality Date   ANAL FISSURE REPAIR  1980's   BREAST BIOPSY Left 2000's   EXCISION MORTON'S NEUROMA Left 05/04/2013   Procedure: LEFT SECOND MT  WEIL/SECOND WEBB SPACE NEUROMA EXCISION ;  Surgeon: Norleen Armor, MD;  Location: [facility] OR;  Service: Orthopedics;  Laterality: Left;   FOOT SURGERY Left 09/2014   hardware removal   SUPRAVENTRICULAR TACHYCARDIA ABLATION  05/19/2013   SUPRAVENTRICULAR TACHYCARDIA ABLATION N/A 05/19/2013   Procedure: SUPRAVENTRICULAR TACHYCARDIA ABLATION;  Surgeon: Danelle LELON Birmingham, MD;  Location: [facility] CATH LAB;  Service: Cardiovascular;  Laterality: N/A;   TOTAL KNEE ARTHROPLASTY Right 03/10/2022   Procedure: Right TOTAL KNEE ARTHROPLASTY;  Surgeon: Vernetta Lonni GRADE, MD;  Location: [facility] OR;  Service: Orthopedics;  Laterality: Right;   Patient Active Problem List   Diagnosis Date Noted   Acute respiratory failure with hypoxia (HCC) 11/13/2023   Mucus plugging of bronchi 11/11/2023   Obstructive pneumonia 11/11/2023   Pneumonia 11/10/2023   Arthrofibrosis of knee joint, right  04/20/2022   Unilateral primary osteoarthritis, right knee 03/10/2022   DJD (degenerative joint disease) 03/10/2022   Status post total right knee replacement 03/10/2022   Primary osteoarthritis of left knee 10/20/2019   Nephrolithiasis 01/15/2016   Palpitations 01/15/2015   Plantar plate injury 89/89/7985   Neuropathic pain of left foot 09/12/2013   WOLFF (WOLFE)-PARKINSON-WHITE (WPW) SYNDROME 09/30/2009   Allergic rhinitis 09/30/2009    Onset date: 11/01/23  REFERRING DIAG:  R49.0 (ICD-10-CM) - Dysphonia    THERAPY DIAG:  Hoarseness  Rationale for Evaluation and Treatment: Rehabilitation  SUBJECTIVE:   SUBJECTIVE STATEMENT: I am doing great today - no phlegm. I haven't been clearing my throat.  Pt accompanied by: self  PERTINENT HISTORY: Pt went to ENT in 2023 and was diagnosed with GERD and began Optivia diet at that time. She came down with illness second week of November and lost voice completely second day of the illness. This eventually became PNA requiring hospitalization (20th-23rd of November). Her voice improved considerably on December 8th, and has improved more and more until today. (See s)  PAIN:  Are you having pain? No  FALLS: Has patient fallen in last 6 months? 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  DIAGNOSTIC FINDINGS:  CLINICAL DATA:  Shortness of breath. EXAM: CHEST - 2 VIEW   11/10/23   COMPARISON:  07/22/2022   FINDINGS: Unchanged cardiac silhouette and mediastinal contours. Interval development of trace bilateral pleural effusions. No discrete focal airspace opacities. No  evidence of edema. No pneumothorax. No acute osseous abnormalities.   IMPRESSION: Interval development of trace bilateral pleural effusions.    Procedure: Flexible fiberoptic laryngoscopy with stroboscopy (68420)   11/29/23 Surgeon: Elena Larry, MD   Anesthesia: Topical lidocaine  and Afrin   Complications: None   Condition is stable throughout exam   Indications and consent:   The patient presents to the clinic with hoarseness. All the risks, benefits, and potential complications were reviewed with the patient preoperatively and informed verbal consent was obtained.   Procedure: The patient was seated upright in the exam chair.   Topical lidocaine  and Afrin were applied to the nasal cavity. After adequate anesthesia had occurred, the flexible telescope was passed into the nasal cavity. The nasopharynx was patent without mass or lesion. The scope was passed behind the soft palate and directed toward the base of tongue. The base of tongue was visualized and was symmetric with no apparent masses or abnormal appearing tissue. There were no signs of a mass or pooling of secretions in the piriform sinuses. The supraglottic structures were normal.   The true vocal cords are mobile, atrophic b/l. The medial edges were bowed with thickening mid 1/3 along the edge c/w phonotrauma. Closure was incomplete, spindle-shaped glottic gap. Periodicity present. The mucosal wave and amplitude were normal and intact. There is moderate interarytenoid pachydermia and post cricoid edema.   The laryngoscope was then slowly withdrawn and the patient tolerated the procedure well. There were no complications or blood loss.   PATIENT REPORTED OUTCOME MEASURES (PROM): V-RQOL:  100 - excellent, raw score =10, and VHI: 2 = mild handicap  TODAY'S TREATMENT:    Abdominal breathing=AB, Phonatory Resistance Training Exercises=PhoRTE                                                                                                                                      DATE:  12/23/23: Pt's GI f/u is now 01/03/24. Pt had reflux gourmet last night, and SLP does not recall pt telling SLP that she did reflux gourmet paste the night before the previous session (mod amount of phlegm from supposed GERD). SLP and pt discussed also keeping track of use of reflux gourmet for her diet/reflux history and she agreed that would be a good idea. Pt draws the conclusion that reduced phlegm from minimal reflux has resulted in only a minimal amount of throat clearing thus far today. Today during session Vaunda cleared only 3 times. Today SLP worked with pt with AB; pt was holding breath at top and with rare min A became independent.  SLP told pt to practice AB 12 minutes BID to habituate AB. SLP then guided pt through PhoRTE and she req'd rare min-mod A for numbers to relax vocalization at top pitches and to open mouth. Req'd initial cues to make higher pitches in chest voice instead of falsetto, and to make lower pitch sentences lower. Independent by session end.   12/14/23: SLP reviewed throat clear alternatives as pt entered performing many throat clears, she thinks d/t GERD. Pt is very eager to see her GI MD. SLP frequently cued pt throughout session for decr'ing throat clearing, with some success in the last 10 minutes. SLP reiterated rationale for limiting throat clearing.  SLP taught pt abdominal breathing (AB) and pt was encouraged to practice AB 15 minutes twice a day. Lastly, SLP worked with pt on PhoRTE; pt's completion was incomplete as she misplaced her exercise sheet. Today she completed exercises with occasional min A.   12/09/23: Pt with 7 throat clears today during entire session; much improved from evaluation. SLP instructed pt with all PhoRTE exercises today; SLP req'd pt to use h initial to begin /a/ to mitigate harsh vocal onset. She req'd tactile cues initially for abdominal recruitment with these exercises but was independent  after this initial cue. Numbers were 10% with strained voice at upper register but did not persist other than one rep. Good pitch range today with model, however with higher frequencies pt notably louder than at lower frequencies - by end of task pt had more equal volume with upper and lower registers. Sentences with high pitch with model req'd initial mod A for volume, and authoritative sentences req'd cues for lower pitch initially, and improved volume. Pt should perform well at home.  12/01/23 (eval): PhoRTE exercises introduced and rationale provided. SLP's role in ST and rationale for BID ST. SLP strongly encouraged pt to reduce throat clearing, informing her she had 31 clears today and two coughs. Lyndell exhibited thoracic breathing during the evaluation today and SLP explained why this was not optimal for pt. Lastly, SLP educated pt how to perform  PhoRTE loud /a/ task.  PATIENT EDUCATION: Education details: see Today's treatment Person educated: Patient Education method: Explanation, Demonstration, Verbal cues, and Handouts Education comprehension: verbalized understanding, returned demonstration, verbal cues required, and needs further education  HOME EXERCISE PROGRAM: PhoRTE voice program at home with YouTube support PRN.  GOALS: Goals reviewed with patient? Yes  SHORT TERM GOALS: Target date: 01/07/24  Pt will complete PhoRTE exercises with rare min A in two sessions Baseline: Goal status: INITIAL  2.  Pt will reduce throat clears to 10 in 2 sessions Baseline: 12/23/23 Goal status: INITIAL  3.  Pt will use AB 80% in paragraph reading tasks  Baseline:  Goal status: INITIAL  4.  Pt will use AB 80% in sentence response tasks  Baseline:  Goal status: INITIAL   LONG TERM GOALS: Target date: 02/04/24  Pt will reduce VHI to 0 Baseline:  Goal status: INITIAL  2.  Pt will complete PhoRTE exercises with rare min A in two sessions Baseline:  Goal status: INITIAL  3.  Pt will  reduce throat clears to 5 or less in 2 sessions Baseline:  Goal status: INITIAL  4.  Pt will use AB 75% in 10 minutes mod complex conversation in 3 sessions Baseline:  Goal status: INITIAL   ASSESSMENT:  CLINICAL IMPRESSION: Patient is a 63 y.o. F who was seen today for treatment of voice due to dx of vocal fold atrophy,  signs of phonotrauma, and sx of chronic throat clearing. See today's treatment for more details about today's session.     OBJECTIVE IMPAIRMENTS: include voice disorder. These impairments are limiting patient from household responsibilities, ADLs/IADLs, and effectively communicating at home and in community. Factors affecting potential to achieve goals and functional outcome are  none noted today .SABRA Patient will benefit from skilled SLP services to address above impairments and improve overall function.  REHAB POTENTIAL: Excellent  PLAN:  SLP FREQUENCY: 1-2x/week  SLP DURATION: 8 weeks  PLANNED INTERVENTIONS: 92507 Treatment of speech (30 or 45 min) , Environmental controls, Internal/external aids, Functional tasks, SLP instruction and feedback, Compensatory strategies, Patient/family education, and PhoRTE exercises    Sameer Teeple, CCC-SLP 12/23/2023, 5:22 PM

## 2023-12-28 ENCOUNTER — Ambulatory Visit: Payer: 59

## 2023-12-29 ENCOUNTER — Telehealth: Payer: Self-pay | Admitting: Pulmonary Disease

## 2023-12-29 DIAGNOSIS — J189 Pneumonia, unspecified organism: Secondary | ICD-10-CM

## 2023-12-29 NOTE — Telephone Encounter (Signed)
 I have placed the CT order, thanks!

## 2023-12-29 NOTE — Telephone Encounter (Signed)
 PT calling saying Dr. MALVA wanted her to have a CT . AVS states the following:  CT scan of the chest without contrast to be done second week of January -To follow-up on the pneumonia to make sure it clears up  I see no order for a CT. Please call PT to advise @ [phone]

## 2023-12-30 ENCOUNTER — Ambulatory Visit: Payer: 59

## 2023-12-30 ENCOUNTER — Other Ambulatory Visit: Payer: 59

## 2023-12-30 DIAGNOSIS — R490 Dysphonia: Secondary | ICD-10-CM | POA: Diagnosis not present

## 2023-12-30 NOTE — Therapy (Signed)
 OUTPATIENT SPEECH LANGUAGE PATHOLOGY VOICE TREATMENT   Patient Name: Laura Wheeler MRN: [id number] DOB:1961/03/11, 63 y.o., female Today's Date: 12/30/2023  PCP: Micheal Pin, MD REFERRING PROVIDER: Okey Burns, MD  END OF SESSION:  End of Session - 12/30/23 0847     Visit Number 5    Number of Visits 13    Date for SLP Re-Evaluation 02/04/24    SLP Start Time 0803    SLP Stop Time  0842    SLP Time Calculation (min) 39 min    Activity Tolerance Patient tolerated treatment well                 Past Medical History:  Diagnosis Date   Arthritis    knees (05/19/2013)   Bronchitis    History of cardiac radiofrequency ablation 04/20/2013   Hx of echocardiogram    Echo (12/15):  EF 60-65%, no RWMA, Gr 1 DD   SVT (supraventricular tachycardia) (HCC)    WPW (Wolff-Parkinson-White syndrome) ~ 1983   a. s/p ablation 05/20/13.   Past Surgical History:  Procedure Laterality Date   ANAL FISSURE REPAIR  1980's   BREAST BIOPSY Left 2000's   EXCISION MORTON'S NEUROMA Left 05/04/2013   Procedure: LEFT SECOND MT  WEIL/SECOND WEBB SPACE NEUROMA EXCISION ;  Surgeon: Norleen Armor, MD;  Location: [facility] OR;  Service: Orthopedics;  Laterality: Left;   FOOT SURGERY Left 09/2014   hardware removal   SUPRAVENTRICULAR TACHYCARDIA ABLATION  05/19/2013   SUPRAVENTRICULAR TACHYCARDIA ABLATION N/A 05/19/2013   Procedure: SUPRAVENTRICULAR TACHYCARDIA ABLATION;  Surgeon: Danelle LELON Birmingham, MD;  Location: [facility] CATH LAB;  Service: Cardiovascular;  Laterality: N/A;   TOTAL KNEE ARTHROPLASTY Right 03/10/2022   Procedure: Right TOTAL KNEE ARTHROPLASTY;  Surgeon: Vernetta Lonni GRADE, MD;  Location: [facility] OR;  Service: Orthopedics;  Laterality: Right;   Patient Active Problem List   Diagnosis Date Noted   Acute respiratory failure with hypoxia (HCC) 11/13/2023   Mucus plugging of bronchi 11/11/2023   Obstructive pneumonia 11/11/2023   Pneumonia 11/10/2023   Arthrofibrosis of knee joint, right  04/20/2022   Unilateral primary osteoarthritis, right knee 03/10/2022   DJD (degenerative joint disease) 03/10/2022   Status post total right knee replacement 03/10/2022   Primary osteoarthritis of left knee 10/20/2019   Nephrolithiasis 01/15/2016   Palpitations 01/15/2015   Plantar plate injury 89/89/7985   Neuropathic pain of left foot 09/12/2013   WOLFF (WOLFE)-PARKINSON-WHITE (WPW) SYNDROME 09/30/2009   Allergic rhinitis 09/30/2009    Onset date: 11/01/23  REFERRING DIAG:  R49.0 (ICD-10-CM) - Dysphonia    THERAPY DIAG:  Hoarseness  Rationale for Evaluation and Treatment: Rehabilitation  SUBJECTIVE:   SUBJECTIVE STATEMENT: My CT and my GI appointments are on Monday. Pt cancelled all her January Tuesday appointments due to work schedule and did not reschedule those appointments.  Pt accompanied by: self  PERTINENT HISTORY: Pt went to ENT in 2023 and was diagnosed with GERD and began Optivia diet at that time. She came down with illness second week of November and lost voice completely second day of the illness. This eventually became PNA requiring hospitalization (20th-23rd of November). Her voice improved considerably on December 8th, and has improved more and more until today. (See s)  PAIN:  Are you having pain? No  FALLS: Has patient fallen in last 6 months? No  PATIENT GOALS: Improve voice   OBJECTIVE:  Note: Objective measures were completed at Evaluation unless otherwise noted.  DIAGNOSTIC FINDINGS:  CLINICAL DATA:  Shortness of breath.  EXAM: CHEST - 2 VIEW   11/10/23   COMPARISON:  07/22/2022   FINDINGS: Unchanged cardiac silhouette and mediastinal contours. Interval development of trace bilateral pleural effusions. No discrete focal airspace opacities. No evidence of edema. No pneumothorax. No acute osseous abnormalities.   IMPRESSION: Interval development of trace bilateral pleural effusions.    Procedure: Flexible fiberoptic laryngoscopy  with stroboscopy (68420)   11/29/23 Surgeon: Elena Larry, MD   Anesthesia: Topical lidocaine  and Afrin   Complications: None   Condition is stable throughout exam   Indications and consent:   The patient presents to the clinic with hoarseness. All the risks, benefits, and potential complications were reviewed with the patient preoperatively and informed verbal consent was obtained.   Procedure: The patient was seated upright in the exam chair.   Topical lidocaine  and Afrin were applied to the nasal cavity. After adequate anesthesia had occurred, the flexible telescope was passed into the nasal cavity. The nasopharynx was patent without mass or lesion. The scope was passed behind the soft palate and directed toward the base of tongue. The base of tongue was visualized and was symmetric with no apparent masses or abnormal appearing tissue. There were no signs of a mass or pooling of secretions in the piriform sinuses. The supraglottic structures were normal.   The true vocal cords are mobile, atrophic b/l. The medial edges were bowed with thickening mid 1/3 along the edge c/w phonotrauma. Closure was incomplete, spindle-shaped glottic gap. Periodicity present. The mucosal wave and amplitude were normal and intact. There is moderate interarytenoid pachydermia and post cricoid edema.   The laryngoscope was then slowly withdrawn and the patient tolerated the procedure well. There were no complications or blood loss.   PATIENT REPORTED OUTCOME MEASURES (PROM): V-RQOL:  100 - excellent, raw score =10, and VHI: 2 = mild handicap  TODAY'S TREATMENT:    Abdominal breathing=AB, Phonatory Resistance Training Exercises=PhoRTE                                                                                                                                     DATE:  12/30/23: Pt with more phlegm/secretions today in throat - more throat clearing. It's probably because we still had food in the office and I  had some yesterday. Pt did not take reflux gourmet last night. Cleared throat today 14 times. Educated pt on pressed/strained voice with numbers, as she initially began with vocal strain at higher pitches from numbers 2-4. After this, strained/pressed voice was eliminated 90% of the time from that exercise - SLP had pt repeat numbers 1-10 following completion of the first set from 5-10 with WNL voice quality. Overall, occasional min A for using AB faded to rare min A for AB with all PhoRTE exercises. Pt's frequency of throat clearing does appear to correlate to feeling more phlegm/secretions in throat/pharynx. If pt can complete HEP next session with minimal coaching from SLP every  other week for remaining appointments is possible.   12/23/23: Pt's GI f/u is now 01/03/24. Pt had reflux gourmet last night, and SLP does not recall pt telling SLP that she did reflux gourmet paste the night before the previous session (mod amount of phlegm from supposed GERD). SLP and pt discussed also keeping track of use of reflux gourmet for her diet/reflux history and she agreed that would be a good idea. Pt draws the conclusion that reduced phlegm from minimal reflux has resulted in only a minimal amount of throat clearing thus far today. Today during session Emmory cleared only 3 times. Today SLP worked with pt with AB; pt was holding breath at top and with rare min A became independent. SLP told pt to practice AB 12 minutes BID to habituate AB. SLP then guided pt through PhoRTE and she req'd rare min-mod A for numbers to relax vocalization at top pitches and to open mouth. Req'd initial cues to make higher pitches in chest voice instead of falsetto, and to make lower pitch sentences lower. Independent by session end.   12/14/23: SLP reviewed throat clear alternatives as pt entered performing many throat clears, she thinks d/t GERD. Pt is very eager to see her GI MD. SLP frequently cued pt throughout session for decr'ing throat  clearing, with some success in the last 10 minutes. SLP reiterated rationale for limiting throat clearing.  SLP taught pt abdominal breathing (AB) and pt was encouraged to practice AB 15 minutes twice a day. Lastly, SLP worked with pt on PhoRTE; pt's completion was incomplete as she misplaced her exercise sheet. Today she completed exercises with occasional min A.   12/09/23: Pt with 7 throat clears today during entire session; much improved from evaluation. SLP instructed pt with all PhoRTE exercises today; SLP req'd pt to use h initial to begin /a/ to mitigate harsh vocal onset. She req'd tactile cues initially for abdominal recruitment with these exercises but was independent after this initial cue. Numbers were 10% with strained voice at upper register but did not persist other than one rep. Good pitch range today with model, however with higher frequencies pt notably louder than at lower frequencies - by end of task pt had more equal volume with upper and lower registers. Sentences with high pitch with model req'd initial mod A for volume, and authoritative sentences req'd cues for lower pitch initially, and improved volume. Pt should perform well at home.  12/01/23 (eval): PhoRTE exercises introduced and rationale provided. SLP's role in ST and rationale for BID ST. SLP strongly encouraged pt to reduce throat clearing, informing her she had 31 clears today and two coughs. Daneya exhibited thoracic breathing during the evaluation today and SLP explained why this was not optimal for pt. Lastly, SLP educated pt how to perform  PhoRTE loud /a/ task.  PATIENT EDUCATION: Education details: see Today's treatment Person educated: Patient Education method: Explanation, Demonstration, Verbal cues, and Handouts Education comprehension: verbalized understanding, returned demonstration, verbal cues required, and needs further education  HOME EXERCISE PROGRAM: PhoRTE voice program at home with YouTube  support PRN.  GOALS: Goals reviewed with patient? Yes  SHORT TERM GOALS: Target date: 01/07/24  Pt will complete PhoRTE exercises with rare min A in two sessions Baseline: Goal status: INITIAL  2.  Pt will reduce throat clears to 10 in 2 sessions Baseline: 12/23/23 Goal status: INITIAL  3.  Pt will use AB 80% in paragraph reading tasks  Baseline:  Goal status: INITIAL  4.  Pt will use AB 80% in sentence response tasks  Baseline:  Goal status: INITIAL   LONG TERM GOALS: Target date: 02/04/24  Pt will reduce VHI to 0 Baseline:  Goal status: INITIAL  2.  Pt will complete PhoRTE exercises with rare min A in two sessions Baseline:  Goal status: INITIAL  3.  Pt will reduce throat clears to 5 or less in 2 sessions Baseline:  Goal status: INITIAL  4.  Pt will use AB 75% in 10 minutes mod complex conversation in 3 sessions Baseline:  Goal status: INITIAL   ASSESSMENT:  CLINICAL IMPRESSION: Patient is a 63 y.o. F who was seen today for treatment of voice due to dx of vocal fold atrophy, signs of phonotrauma, and sx of chronic throat clearing. See today's treatment for more details about today's session. Pt cancelled her Tuesday appointments in January due to her work schedule, but did not reschedule them.  OBJECTIVE IMPAIRMENTS: include voice disorder. These impairments are limiting patient from household responsibilities, ADLs/IADLs, and effectively communicating at home and in community. Factors affecting potential to achieve goals and functional outcome are  none noted today .SABRA Patient will benefit from skilled SLP services to address above impairments and improve overall function.  REHAB POTENTIAL: Excellent  PLAN:  SLP FREQUENCY: 1-2x/week  SLP DURATION: 8 weeks  PLANNED INTERVENTIONS: 92507 Treatment of speech (30 or 45 min) , Environmental controls, Internal/external aids, Functional tasks, SLP instruction and feedback, Compensatory strategies, Patient/family  education, and PhoRTE exercises    Abimael Zeiter, CCC-SLP 12/30/2023, 8:48 AM

## 2024-01-03 ENCOUNTER — Ambulatory Visit
Admission: RE | Admit: 2024-01-03 | Discharge: 2024-01-03 | Disposition: A | Discharge status: Home / Self Care | Payer: 59 | Source: Ambulatory Visit | Attending: Pulmonary Disease | Admitting: Pulmonary Disease

## 2024-01-03 DIAGNOSIS — J189 Pneumonia, unspecified organism: Secondary | ICD-10-CM

## 2024-01-04 ENCOUNTER — Ambulatory Visit: Payer: 59

## 2024-01-07 ENCOUNTER — Ambulatory Visit: Payer: 59

## 2024-01-10 NOTE — Telephone Encounter (Signed)
Updated patient about CT scan findings. Minimal scarring at the bases, nothing else needs done at present

## 2024-01-13 ENCOUNTER — Ambulatory Visit: Payer: 59

## 2024-01-13 DIAGNOSIS — R490 Dysphonia: Secondary | ICD-10-CM

## 2024-01-13 NOTE — Therapy (Signed)
OUTPATIENT SPEECH LANGUAGE PATHOLOGY VOICE TREATMENT   Patient Name: Laura Wheeler MRN: [id number] DOB:Jan 12, 1961, 63 y.o., female Today's Date: 01/13/2024  PCP: Evelena Peat, MD REFERRING PROVIDER: Ashok Croon, MD  END OF SESSION:  End of Session - 01/13/24 0849     Visit Number 6    Number of Visits 13    Date for SLP Re-Evaluation 02/04/24    SLP Start Time 0804    SLP Stop Time  0847    SLP Time Calculation (min) 43 min    Activity Tolerance Patient tolerated treatment well                  Past Medical History:  Diagnosis Date   Arthritis    "knees" (05/19/2013)   Bronchitis    History of cardiac radiofrequency ablation 04/20/2013   Hx of echocardiogram    Echo (12/15):  EF 60-65%, no RWMA, Gr 1 DD   SVT (supraventricular tachycardia) (HCC)    WPW (Wolff-Parkinson-White syndrome) ~ 1983   a. s/p ablation 05/20/13.   Past Surgical History:  Procedure Laterality Date   ANAL FISSURE REPAIR  1980's   BREAST BIOPSY Left 2000's   EXCISION MORTON'S NEUROMA Left 05/04/2013   Procedure: LEFT SECOND MT  WEIL/SECOND WEBB SPACE NEUROMA EXCISION ;  Surgeon: [name], MD;  Location: [facility] OR;  Service: Orthopedics;  Laterality: Left;   FOOT SURGERY Left 09/2014   hardware removal   SUPRAVENTRICULAR TACHYCARDIA ABLATION  05/19/2013   SUPRAVENTRICULAR TACHYCARDIA ABLATION N/A 05/19/2013   Procedure: SUPRAVENTRICULAR TACHYCARDIA ABLATION;  Surgeon: Marinus Maw, MD;  Location: [facility] CATH LAB;  Service: Cardiovascular;  Laterality: N/A;   TOTAL KNEE ARTHROPLASTY Right 03/10/2022   Procedure: Right TOTAL KNEE ARTHROPLASTY;  Surgeon: Kathryne Hitch, MD;  Location: [facility] OR;  Service: Orthopedics;  Laterality: Right;   Patient Active Problem List   Diagnosis Date Noted   Acute respiratory failure with hypoxia (HCC) 11/13/2023   Mucus plugging of bronchi 11/11/2023   Obstructive pneumonia 11/11/2023   Pneumonia 11/10/2023   Arthrofibrosis of knee joint,  right 04/20/2022   Unilateral primary osteoarthritis, right knee 03/10/2022   DJD (degenerative joint disease) 03/10/2022   Status post total right knee replacement 03/10/2022   Primary osteoarthritis of left knee 10/20/2019   Nephrolithiasis 01/15/2016   Palpitations 01/15/2015   Plantar plate injury 40/98/1191   Neuropathic pain of left foot 09/12/2013   WOLFF (WOLFE)-PARKINSON-WHITE (WPW) SYNDROME 09/30/2009   Allergic rhinitis 09/30/2009    Onset date: 11/01/23  REFERRING DIAG:  R49.0 (ICD-10-CM) - Dysphonia    THERAPY DIAG:  Hoarseness  Rationale for Evaluation and Treatment: Rehabilitation  SUBJECTIVE:   SUBJECTIVE STATEMENT: "He said he saw some scar tissue from my PNA." (Results from broncoscopy)  Pt accompanied by: self  PERTINENT HISTORY: Pt went to ENT in 2023 and was diagnosed with GERD and began Optivia diet at that time. She came down with illness second week of November and lost voice completely second day of the illness. This eventually became PNA requiring hospitalization (20th-23rd of November). Her voice improved considerably on December 8th, and has improved more and more until today. (See "s")  PAIN:  Are you having pain? No  FALLS: Has patient fallen in last 6 months? No  PATIENT GOALS: Improve voice   OBJECTIVE:  Note: Objective measures were completed at Evaluation unless otherwise noted.  DIAGNOSTIC FINDINGS:  CLINICAL DATA:  Shortness of breath. EXAM: CHEST - 2 VIEW   11/10/23   COMPARISON:  07/22/2022   FINDINGS: Unchanged cardiac silhouette and mediastinal contours. Interval development of trace bilateral pleural effusions. No discrete focal airspace opacities. No evidence of edema. No pneumothorax. No acute osseous abnormalities.   IMPRESSION: Interval development of trace bilateral pleural effusions.    Procedure: Flexible fiberoptic laryngoscopy with stroboscopy (78295)   11/29/23 Surgeon: Ashok Croon, MD   Anesthesia:  Topical lidocaine and Afrin   Complications: None   Condition is stable throughout exam   Indications and consent:   The patient presents to the clinic with hoarseness. All the risks, benefits, and potential complications were reviewed with the patient preoperatively and informed verbal consent was obtained.   Procedure: The patient was seated upright in the exam chair.   Topical lidocaine and Afrin were applied to the nasal cavity. After adequate anesthesia had occurred, the flexible telescope was passed into the nasal cavity. The nasopharynx was patent without mass or lesion. The scope was passed behind the soft palate and directed toward the base of tongue. The base of tongue was visualized and was symmetric with no apparent masses or abnormal appearing tissue. There were no signs of a mass or pooling of secretions in the piriform sinuses. The supraglottic structures were normal.   The true vocal cords are mobile, atrophic b/l. The medial edges were bowed with thickening mid 1/3 along the edge c/w phonotrauma. Closure was incomplete, spindle-shaped glottic gap. Periodicity present. The mucosal wave and amplitude were normal and intact. There is moderate interarytenoid pachydermia and post cricoid edema.   The laryngoscope was then slowly withdrawn and the patient tolerated the procedure well. There were no complications or blood loss.   PATIENT REPORTED OUTCOME MEASURES (PROM): V-RQOL:  100 - "excellent", raw score =10, and VHI: 2 = "mild handicap"  TODAY'S TREATMENT:    Abdominal breathing=AB, Phonatory Resistance Training Exercises=PhoRTE                                                                                                                                     DATE:  01/13/24: "Oh it's definitely better," was pt's response when asked if her voice quality was improved over initial eval. Pt with obvious phlegm today in session. Took reflux gourmet this morning. Throat cleared x5 during  session today, but was also taking sips of water. GI endoscopy on 02/07/24. As session continued pt's throat clearing got less frequent, and phlegm production lessened. Pt was independent with "ah" with initial cue for "ha" instead of hard onset "ah". Numbers were independent with cue to start lower pitch, and sentences were independent. Pt's voice quality was better today than previous session.   12/30/23: Pt with more phlegm/secretions today in throat - more throat clearing. "It's probably because we still had food in the office and I had some yesterday." Pt did not take reflux gourmet last night. Cleared throat today 14 times. Educated pt on pressed/strained voice with numbers, as she initially  began with vocal strain at higher pitches from numbers 2-4. After this, strained/pressed voice was eliminated 90% of the time from that exercise - SLP had pt repeat numbers 1-10 following completion of the first set from 5-10 with WNL voice quality. Overall, occasional min A for using AB faded to rare min A for AB with all PhoRTE exercises. Pt's frequency of throat clearing does appear to correlate to feeling more phlegm/secretions in throat/pharynx. If pt can complete HEP next session with minimal coaching from SLP every other week for remaining appointments is possible.   12/23/23: Pt's GI f/u is now 01/03/24. Pt had reflux gourmet last night, and SLP does not recall pt telling SLP that she did reflux gourmet paste the night before the previous session (mod amount of phlegm from supposed GERD). SLP and pt discussed also keeping track of use of reflux gourmet for her diet/reflux history and she agreed that would be a good idea. Pt draws the conclusion that reduced phlegm from minimal reflux has resulted in only a minimal amount of throat clearing thus far today. Today during session Dalia cleared only 3 times. Today SLP worked with pt with AB; pt was holding breath at top and with rare min A became independent. SLP told  pt to practice AB 12 minutes BID to habituate AB. SLP then guided pt through PhoRTE and she req'd rare min-mod A for numbers to relax vocalization at top pitches and to open mouth. Req'd initial cues to make higher pitches in chest voice instead of falsetto, and to make lower pitch sentences lower. Independent by session end.   12/14/23: SLP reviewed throat clear alternatives as pt entered performing many throat clears, she thinks d/t GERD. Pt is very eager to see her GI MD. SLP frequently cued pt throughout session for decr'ing throat clearing, with some success in the last 10 minutes. SLP reiterated rationale for limiting throat clearing.  SLP taught pt abdominal breathing (AB) and pt was encouraged to practice AB 15 minutes twice a day. Lastly, SLP worked with pt on PhoRTE; pt's completion was incomplete as she misplaced her exercise sheet. Today she completed exercises with occasional min A.   12/09/23: Pt with 7 throat clears today during entire session; much improved from evaluation. SLP instructed pt with all PhoRTE exercises today; SLP req'd pt to use "h" initial to begin /a/ to mitigate harsh vocal onset. She req'd tactile cues initially for abdominal recruitment with these exercises but was independent after this initial cue. Numbers were 10% with strained voice at upper register but did not persist other than one rep. Good pitch range today with model, however with higher frequencies pt notably louder than at lower frequencies - by end of task pt had more equal volume with upper and lower registers. Sentences with high pitch with model req'd initial mod A for volume, and authoritative sentences req'd cues for lower pitch initially, and improved volume. Pt should perform well at home.  12/01/23 (eval): PhoRTE exercises introduced and rationale provided. SLP's role in ST and rationale for BID ST. SLP strongly encouraged pt to reduce throat clearing, informing her she had 31 clears today and two  coughs. Taylar exhibited thoracic breathing during the evaluation today and SLP explained why this was not optimal for pt. Lastly, SLP educated pt how to perform  PhoRTE loud /a/ task.  PATIENT EDUCATION: Education details: see "Today's treatment" Person educated: Patient Education method: Explanation, Demonstration, Verbal cues, and Handouts Education comprehension: verbalized understanding, returned demonstration, verbal  cues required, and needs further education  HOME EXERCISE PROGRAM: PhoRTE voice program at home with YouTube support PRN.  GOALS: Goals reviewed with patient? Yes  SHORT TERM GOALS: Target date: 01/07/24  Pt will complete PhoRTE exercises with rare min A in two sessions Baseline: Goal status: partially met  2.  Pt will reduce throat clears to 10 in 2 sessions Baseline: 12/23/23 Goal status: met  3.  Pt will use AB 80% in paragraph reading tasks  Baseline:  Goal status: met  4.  Pt will use AB 80% in sentence response tasks  Baseline:  Goal status: met   LONG TERM GOALS: Target date: 02/04/24  Pt will reduce VHI to 0 Baseline:  Goal status: INITIAL  2.  Pt will complete PhoRTE exercises with modified independence in two sessions Baseline: 01/13/24 Goal status: INITIAL  3.  Pt will reduce throat clears to 5 or less in 2 sessions Baseline: 01/13/24 Goal status: INITIAL  4.  Pt will use AB 75% in 10 minutes mod complex conversation in 3 sessions Baseline:  Goal status: INITIAL   ASSESSMENT:  CLINICAL IMPRESSION: Patient is a 63 y.o. F who was seen today for treatment of voice due to dx of vocal fold atrophy, signs of phonotrauma, and sx of chronic throat clearing. See "today's treatment" for more details about today's session. Pt's voice quality is better today than in previous sessions.  OBJECTIVE IMPAIRMENTS: include voice disorder. These impairments are limiting patient from household responsibilities, ADLs/IADLs, and effectively communicating at  home and in community. Factors affecting potential to achieve goals and functional outcome are  none noted today .Marland Kitchen Patient will benefit from skilled SLP services to address above impairments and improve overall function.  REHAB POTENTIAL: Excellent  PLAN:  SLP FREQUENCY: 1-2x/week  SLP DURATION: 8 weeks  PLANNED INTERVENTIONS: 92507 Treatment of speech (30 or 45 min) , Environmental controls, Internal/external aids, Functional tasks, SLP instruction and feedback, Compensatory strategies, Patient/family education, and PhoRTE exercises    Zandyr Barnhill, CCC-SLP 01/13/2024, 8:50 AM

## 2024-03-16 ENCOUNTER — Other Ambulatory Visit: Payer: Self-pay | Admitting: Pulmonary Disease

## 2024-05-24 ENCOUNTER — Other Ambulatory Visit (INDEPENDENT_AMBULATORY_CARE_PROVIDER_SITE_OTHER): Payer: Self-pay | Admitting: Otolaryngology

## 2024-05-29 ENCOUNTER — Ambulatory Visit (INDEPENDENT_AMBULATORY_CARE_PROVIDER_SITE_OTHER): Payer: 59 | Admitting: Otolaryngology

## 2024-08-02 ENCOUNTER — Ambulatory Visit (INDEPENDENT_AMBULATORY_CARE_PROVIDER_SITE_OTHER): Admitting: Otolaryngology

## 2024-08-22 ENCOUNTER — Telehealth (INDEPENDENT_AMBULATORY_CARE_PROVIDER_SITE_OTHER): Payer: Self-pay | Admitting: Otolaryngology

## 2024-08-22 NOTE — Telephone Encounter (Signed)
 Left msg on voicemail to call us  back to ger her appointment moved from 09/06/24 due to scheduling change for Dr Soldatova.

## 2024-09-06 ENCOUNTER — Ambulatory Visit (INDEPENDENT_AMBULATORY_CARE_PROVIDER_SITE_OTHER): Admitting: Otolaryngology

## 2024-09-20 ENCOUNTER — Encounter (INDEPENDENT_AMBULATORY_CARE_PROVIDER_SITE_OTHER): Payer: Self-pay | Admitting: Otolaryngology

## 2024-09-20 ENCOUNTER — Ambulatory Visit (INDEPENDENT_AMBULATORY_CARE_PROVIDER_SITE_OTHER): Admitting: Otolaryngology

## 2024-09-20 VITALS — BP 117/73 | HR 91

## 2024-09-20 DIAGNOSIS — J383 Other diseases of vocal cords: Secondary | ICD-10-CM | POA: Diagnosis not present

## 2024-09-20 DIAGNOSIS — R0981 Nasal congestion: Secondary | ICD-10-CM

## 2024-09-20 DIAGNOSIS — R0982 Postnasal drip: Secondary | ICD-10-CM | POA: Diagnosis not present

## 2024-09-20 DIAGNOSIS — J309 Allergic rhinitis, unspecified: Secondary | ICD-10-CM

## 2024-09-20 DIAGNOSIS — K219 Gastro-esophageal reflux disease without esophagitis: Secondary | ICD-10-CM | POA: Diagnosis not present

## 2024-09-20 DIAGNOSIS — R490 Dysphonia: Secondary | ICD-10-CM

## 2024-09-20 MED ORDER — METHYLPREDNISOLONE 4 MG PO TBPK: ORAL_TABLET | ORAL | 1 refills | Status: AC

## 2024-09-20 MED ORDER — AMOXICILLIN-POT CLAVULANATE 875-125 MG PO TABS: 1.0000 | ORAL_TABLET | Freq: Two times a day (BID) | ORAL | 0 refills | Status: AC

## 2024-09-20 MED ORDER — FLUCONAZOLE 150 MG PO TABS: 150.0000 mg | ORAL_TABLET | Freq: Every day | ORAL | 0 refills | Status: AC

## 2024-09-20 NOTE — Progress Notes (Signed)
 ENT Progress Note:   Update 09/20/2024  Discussed the use of AI scribe software for clinical note transcription with the patient, who gave verbal consent to proceed.  History of Present Illness Laura Wheeler is a 63 year old female with hx  chronic reflux and persistent hoarseness, who is here for f/u.   She has experienced persistent throat discomfort and voice changes since the second week of September, 2024, with voice issues fluctuating. These symptoms began after returning from a trip to [place] . She has been using a proton pump inhibitor (PPI) twice daily for reflux but finds it less effective than Pepcid , which she used previously. She had pneumonia in November of last year.  She has been on allergy shots for almost three years and continues to use Xyzal  at night, along with nasal sprays and Wixela in the morning and at night. Her symptoms worsened upon returning from [place] , and she was informed by the allergy clinic that ragweed is prevalent. She has not seen her allergist since early in the year but plans to redo tests at the next visit.  She attended speech therapy in December, January, and February, which helped when her throat was clear. However, she finds it difficult to perform exercises when her throat is coated, describing it as uncomfortable. She continues to do the exercises but has paused recently due to increased strain.  She had pH probe testing and it was positive for GERD LPR.    Records Reviewed:  Initial Evaluation  Reason for Consult: dysphonia for 4-5 weeks and throat discomfort  dyspnea recent PNA  HPI:  Discussed the use of AI scribe software for clinical note transcription with the patient, who gave verbal consent to proceed.  History of Present Illness   The patient is a 51 yoF, with a history of GERD, bronchitis, and recent pneumonia, presents for evaluation of chronic voice changes. They report losing their voice on November 10th, which was accompanied  by respiratory issues. Despite feeling unwell, they traveled to [place] for a wedding, where their condition worsened. They sought medical help in [place], receiving prednisone  and three breathing treatments. [place] to [place] were admitted to the hospital with pneumonia.  Post-hospitalization, the patient reports persistent voice scratchiness and a sensation of suffocation, despite receiving antibiotics and breathing treatments. They have been managing GERD symptoms with a PPI, which they express a desire to discontinue due to concerns about long-term use side effects. They also report a history of mucus in the throat, which was previously attributed to GERD by another ENT. They have attempted dietary changes to manage these symptoms, with some success.  The patient has a history of recurrent bronchitis, typically in the fall, and has had pneumonia twice. They have received all recommended vaccinations. They are currently under the care of an allergist and have been receiving allergy shots for two years. They also report a history of knee replacement surgery. The patient denies any current cough, but reports a sensation of tightness with breathing and has not been able to cough up any sputum. They are scheduled to see a pulmonologist later this month.        Records Reviewed: Family Medicine office visit 11/22/23 Laura Wheeler is seen today for hospital follow up for bilateral post-obstructive pneumonia.  Her past medical history is significant for with Parkinson White syndrome, allergic rhinitis, osteoarthritis, history of kidney stones, and reactive airway issues with frequent bronchitis in the past.   She had been seen  here on 13 November with sore throat and cough.  Strep and COVID testing were negative.  O2 sats that visit were 97% and she was in no respiratory distress but she has had some lower O2 sats at home earlier in the week.  She is maintained on Wixela inhaler and we have given  Depo-Medrol  80 mg IM.  She left for [place] for her son's wedding the next day and did not see any improvements there and went to urgent care in [place] was given multiple albuterol  treatments.  Did not improve any and was seen in the ER here (after return) on the day of admission with low-grade fever.  Really minimal cough but she had some increased shortness of breath.  No obvious infiltrate on plain chest x-ray but did show evidence for small bilateral pleural effusions.  CT angiogram of the chest did show mucus impaction bilateral lower lobe bronchi with postobstructive pneumonia.  No evidence for PE.  Patient was treated with Rocephin  and Zithromax  and gradually improved.  Initial white count of 17,000 and 10,000 at discharge.  She was discharged on Augmentin  and has completed that at this time   She is very upset that she had worsening symptoms and did not improve and was upset with not getting an antibiotic prior to her travel to [place].  It was our judgment at that point that she had likely viral illness and at visit of 11/03/2023 had O2 sats of 97% on room air and no fever and nonfocal exam.  She has now been having some persistent hoarseness now for over 3 weeks.  No history of smoking.  Does have history of GERD but controlled on Protonix .  No active GERD symptoms.  No known history of thrush. Does use Wixella inhaler per pulmonary.    She is requesting nutrition referral.  She has lost considerable out of weight on her own and would like to get nutrition referral for some feedback regarding weight loss maintenance  63 year old female with recent admission for bilateral postobstructive pneumonia in the setting of significant mucous plugging and history of asthma.  She sees pulmonary and has scheduled follow-up 30th of this month.   -Recommend follow-up chest x-ray in 2 to 3 weeks to document clearing -Repeat CBC with differential today -Set up ENT referral regarding her persistent  hoarseness (> 3 weeks) for further evaluation with possible naso-laryngoscopy -Setting up nutrition referral per patient request -Follow-up immediately for increased shortness of breath, worsening cough, or any recurrent fever     Hospital Admission 11/13/23 for PNA 63 y.o. female with medical history significant of WPW status post ablation, asthma, recurrent bronchitis, allergic rhinitis presented to the ED with shortness of breath and URI symptoms for about 13 days.  She spoke with her primary care doctor who prescribed a course of prednisone  for her.  She took about 2 doses but did not complete her therapy.  She continued to feel shortness of breath over the past week and has recently developed a cough productive of small amounts of yellowish sputum.  She denied any fevers or chills at home.    At [facility] ED, she was noted to have a fever of 100.31F on arrival and was tachycardic to the 110s.  She was hemodynamically stable.  CBC showed a WBC count of 17.9.  COVID/influenza/RSV were all negative, group A strep PCR was negative, and lactate was normal.  CTA chest was negative for PE but showed mucus impaction of bilateral lower  lobe bronchi with postobstructive pneumonia.  Started on antibiotics, admitted under hospital service.  Details below.   Severe sepsis and acute hypoxic respiratory failure secondary to bilateral postobstructive pneumonia, POA: Patient met criteria for severe sepsis based on fever, tachypnea, leukocytosis and acute hypoxia.  Pneumonia confirmed on CT chest. RVP negative as well.  Continued on Rocephin  and Zithromax .  Improved significantly.  Currently on room air.  Stable for discharge.  Discharging on 7 days of Augmentin  and cough medications.  Resume PTA albuterol  inhaler.   WPW syndrome status post ablation: Currently asymptomatic.   Office visit with Dr Jesus 04/15/23 For the past 2 years she has had progressive worsening problem with clearing her throat, intermittent  soreness of her throat. She does get occasional heartburn. She drinks couple cups of coffee every morning and wine on occasion. She tried eliminating coffee for 1 week without relief. She also tried a PPI medication for couple months without relief. She has had some problems with allergies and bronchitis. She did not have childhood asthma. She is on allergy shots right now.  Impression & Plans:  Normal head neck exam. No signs of infection or neoplasm. Her symptoms are consistent with reflux laryngitis. Recommend complete illumination of caffeine.We discussed causes of reflux, including lifestyle and dietary factors. Recommend strict avoidance of all tobacco, caffeine, alcohol, chocolate and peppermint. A reflux handout with more detailed instructions was provided to the patient.    Past Medical History:  Diagnosis Date   Arthritis    knees (05/19/2013)   Bronchitis    History of cardiac radiofrequency ablation 04/20/2013   Hx of echocardiogram    Echo (12/15):  EF 60-65%, no RWMA, Gr 1 DD   SVT (supraventricular tachycardia)    WPW (Wolff-Parkinson-White syndrome) ~ 1983   a. s/p ablation 05/20/13.    Past Surgical History:  Procedure Laterality Date   ANAL FISSURE REPAIR  1980's   BREAST BIOPSY Left 2000's   EXCISION MORTON'S NEUROMA Left 05/04/2013   Procedure: LEFT SECOND MT  WEIL/SECOND WEBB SPACE NEUROMA EXCISION ;  Surgeon: Norleen Armor, MD;  Location: [facility] OR;  Service: Orthopedics;  Laterality: Left;   FOOT SURGERY Left 09/2014   hardware removal   SUPRAVENTRICULAR TACHYCARDIA ABLATION  05/19/2013   SUPRAVENTRICULAR TACHYCARDIA ABLATION N/A 05/19/2013   Procedure: SUPRAVENTRICULAR TACHYCARDIA ABLATION;  Surgeon: Danelle LELON Birmingham, MD;  Location: [facility] CATH LAB;  Service: Cardiovascular;  Laterality: N/A;   TOTAL KNEE ARTHROPLASTY Right 03/10/2022   Procedure: Right TOTAL KNEE ARTHROPLASTY;  Surgeon: Vernetta Lonni GRADE, MD;  Location: [facility] OR;  Service: Orthopedics;  Laterality: Right;     Family History  Problem Relation Age of Onset   Diabetes Mother    Depression Mother    Melanoma Mother    Hypothyroidism Mother    Hyperlipidemia Mother    Cancer Father        ?osteosarcoma leg   Hypertension Father    Diabetes Brother    Arthritis Brother    Heart attack Neg Hx     Social History:  reports that she has never smoked. She has never used smokeless tobacco. She reports that she does not currently use alcohol. She reports that she does not use drugs.  Allergies:  Allergies  Allergen Reactions   Dog Epithelium (Canis Lupus Familiaris) Shortness Of Breath, Itching, Other (See Comments) and Cough    Respiratory Distress   Horse Epithelium Shortness Of Breath, Other (See Comments) and Cough    Respiratory Distress   Oxycodone   Nausea Only   Sulfa Antibiotics Itching    Medications: I have reviewed the patient's current medications.  The PMH, PSH, Medications, Allergies, and SH were reviewed and updated.  ROS: Constitutional: Negative for fever, weight loss and weight gain. Cardiovascular: Negative for chest pain and dyspnea on exertion. Respiratory: Is not experiencing shortness of breath at rest. Gastrointestinal: Negative for nausea and vomiting. Neurological: Negative for headaches. Psychiatric: The patient is not nervous/anxious  Blood pressure 117/73, pulse 91, last menstrual period 09/20/2014, SpO2 92%.  PHYSICAL EXAM:  Exam: General: Well-developed, well-nourished Communication and Voice: slightly raspy Respiratory Respiratory effort: Equal inspiration and expiration without stridor Cardiovascular Peripheral Vascular: Warm extremities with equal color/perfusion Eyes: No nystagmus with equal extraocular motion bilaterally Neuro/Psych/Balance: Patient oriented to person, place, and time; Appropriate mood and affect; Gait is intact with no imbalance; Cranial nerves I-XII are intact Head and Face Inspection: Normocephalic and atraumatic without  mass or lesion Palpation: Facial skeleton intact without bony stepoffs Salivary Glands: No mass or tenderness Facial Strength: Facial motility symmetric and full bilaterally ENT Pinna: External ear intact and fully developed External canal: Canal is patent with intact skin Tympanic Membrane: Clear and mobile External Nose: No scar or anatomic deformity Internal Nose: Septum is deviated to the left. No polyp, or purulence. Mucosal edema and erythema present.  Bilateral inferior turbinate hypertrophy.  Lips, Teeth, and gums: Mucosa and teeth intact and viable TMJ: No pain to palpation with full mobility Oral cavity/oropharynx: No erythema or exudate, no lesions present Nasopharynx: No mass or lesion with intact mucosa Hypopharynx: Intact mucosa without pooling of secretions Larynx Glottic: Full true vocal cord mobility without lesion or mass along L VF mild thickening along right VF Supraglottic: Normal appearing epiglottis and AE folds Interarytenoid Space: No or minimal pachydermia or edema Subglottic Space: Patent without lesion or edema Neck Neck and Trachea: Midline trachea without mass or lesion Thyroid : No mass or nodularity Lymphatics: No lymphadenopathy  Procedure:   Preoperative diagnosis: hoarseness dysphonia  Postoperative diagnosis:   same + GERD LPR + PND + bilateral VF lesions  Procedure: Flexible fiberoptic laryngoscopy   Surgeon: Elena Larry, MD  Anesthesia: Topical lidocaine  and Afrin  Complications: None  Condition is stable throughout exam  Indications and consent:   The patient presents to the clinic with hoarseness. All the risks, benefits, and potential complications were reviewed with the patient preoperatively and informed verbal consent was obtained.  Procedure: The patient was seated upright in the exam chair.   Topical lidocaine  and Afrin were applied to the nasal cavity. After adequate anesthesia had occurred, the flexible telescope was  passed into the nasal cavity. The nasopharynx was patent without mass or lesion. The scope was passed behind the soft palate and directed toward the base of tongue. The base of tongue was visualized and was symmetric with no apparent masses or abnormal appearing tissue. There were no signs of a mass or pooling of secretions in the piriform sinuses. The supraglottic structures were normal.  The true vocal cords are mobile, mildly atrophic b/l. The medial edges were bowed with thickening mid 1/3 along the edge unclear if phonotrauma vs lesion since there is a predominant lesion along the left VF      Studies Reviewed:CT Angio 11/10/23 CLINICAL DATA:  Concern for pulmonary edema.   EXAM: CT ANGIOGRAPHY CHEST WITH CONTRAST   TECHNIQUE: Multidetector CT imaging of the chest was performed using the standard protocol during bolus administration of intravenous contrast. Multiplanar CT image reconstructions and  MIPs were obtained to evaluate the vascular anatomy.   RADIATION DOSE REDUCTION: This exam was performed according to the departmental dose-optimization program which includes automated exposure control, adjustment of the mA and/or kV according to patient size and/or use of iterative reconstruction technique.   CONTRAST:  75mL OMNIPAQUE  IOHEXOL  350 MG/ML SOLN   COMPARISON:  Chest radiograph dated 11/10/2023.   FINDINGS: Cardiovascular: There is no cardiomegaly or pericardial effusion. Mild atherosclerotic calcification of the thoracic aorta. No aneurysmal dilatation or dissection. No pulmonary artery embolus identified.   Mediastinum/Nodes: Mildly enlarged right hilar lymph node measures 15 mm. Subcarinal lymph node measures 12 mm. The esophagus is grossly unremarkable. No mediastinal fluid collection.   Lungs/Pleura: Bilateral lower lobe patchy consolidation and nodularity, right greater than left, consistent with pneumonia. Aspiration is not excluded. There is mucous  impaction and occlusion of the right lower lobe bronchi bilaterally. There is no pleural effusion or pneumothorax.   Upper Abdomen: No acute abnormality.   Musculoskeletal: No acute osseous pathology.   Review of the MIP images confirms the above findings.   IMPRESSION: 1. No CT evidence of pulmonary artery embolus. 2. Mucous impaction of the bilateral lower lobe bronchi with postobstructive pneumonia. Follow-up to resolution recommended. 3. Mildly enlarged right hilar and subcarinal lymph nodes, likely reactive. 4.  Aortic Atherosclerosis (ICD10-I70.0).   CXR 11/10/23 EXAM: CHEST - 2 VIEW   COMPARISON:  07/22/2022   FINDINGS: Unchanged cardiac silhouette and mediastinal contours. Interval development of trace bilateral pleural effusions. No discrete focal airspace opacities. No evidence of edema. No pneumothorax. No acute osseous abnormalities.   IMPRESSION: Interval development of trace bilateral pleural effusions.    Assessment/Plan: No diagnosis found.   Assessment and Plan    Chronic Dysphonia and Voice Changes Chronic voice changes since November 10th, with associated respiratory issues, and admission for PNA. Hx of bronchitis in the past, hx of environmental allergies, on allergy shots.   Strobe today: The true vocal folds are mobile, atrophic b/l. The medial edges were bowed with thickening mid 1/3 along the edge c/w phonotrauma.Closure was incomplete, spindle-shaped glottic gap. Evidence of post-nasal drainage and GERD LPR.  Discussed the potential benefits of voice therapy to improve vocal function and reduce irritation.  - Refer to speech therapy for voice therapy - Continue current nasal spray regimen (Astelin , Flonase )   Chronic nasal congestion and Postnasal Drip/on allergy shots for environmental allergies Postnasal drip contributing to throat irritation and voice changes. Currently managed with nasal sprays and antihistamines. Discussed optimizing  control of postnasal drainage to reduce throat irritation and improve voice quality. - Continue current nasal spray regimen (Astelin , Flonase ) 2 puffs b/l nares BID - Continue Xyzal  5 mg daily if effective - consider nasal saline rinses   Pneumonia Recent pneumonia diagnosed via CT scan on November 20th after a trip to [place], treated with antibiotics and breathing treatments. Symptoms have improved. Discussed the importance of follow-up with a pulmonary doctor to ensure complete resolution and evaluate for underlying conditions contributing to recurrent pneumonia. - Follow up with pulmonary doctor on November 30th - Discuss recurrent pneumonia with allergist for additional workup to rule out conditions such as CVID and other conditions predisposing to recurrent respiratory infections  - consider limiting exposure to sick contacts while traveling  Chronic Gastroesophageal Reflux Disease (GERD) GERD managed with PPI. Patient desires to discontinue PPI due to long-term use concerns. Discussed risks of long-term PPI use. Explained benefits of switching to Pepcid  and gradual tapering off PPI to avoid  rebound acid hypersecretion. Recommended dietary and lifestyle changes to manage reflux symptoms. - Switch from PPI to Pepcid  20 mg BID, tapering off PPI gradually - Recommended reflux supplement Reflux Gourmet available on [place] - Provided website resource for dietary and lifestyle changes to manage reflux  General Health Maintenance Up to date on flu, COVID, RSV, pneumonia, and shingles vaccinations. - No additional recommendations at this time  Follow-up - Follow in a few months after a trial of voice therapy - Follow up with allergist in January or February - Follow up with GI specialist for GERD management. Already established with GI.     Update 09/20/2024 Assessment and Plan Assessment & Plan Chronic dysphonia Laryngeal exam today with white plaque ( L > R) at the medial edge and  vocal fold atrophy Persistent white plaques on vocal cords unclear if due to phonotrauma vs chronic hyperkeratosis vs dysplasia. Biopsy considered to rule out other causes, though central location of plaques is reassuring against malignancy.  - Prescribed Diflucan/Augmentin /Medrol  Pack to treat chronic laryngitis  - Plan follow-up in 4 to 6 weeks to reassess VF lesions - If no changes, will consider DML exam under anesthesia, excisional biopsy and injection augmentation.   Laryngopharyngeal reflux Persistent symptoms despite PPI therapy. Better symptom control reported with medication similar to Pepcid . Current PPI may not prevent reflux effectively. - Continue current reflux management with PPI - discuss management with GI  Allergic rhinitis Chronic nasal congestion and PND Chronic allergic rhinitis managed with Xyzal , nasal sprays, and allergy shots. Symptoms may be exacerbated by environmental allergens. - Continue current allergy management with Xyzal , nasal sprays, and allergy shots.      Elena Larry, MD Otolaryngology [facility] Phone: [phone] Fax: [phone]    09/20/2024, 4:28 PM

## 2024-10-10 ENCOUNTER — Telehealth (INDEPENDENT_AMBULATORY_CARE_PROVIDER_SITE_OTHER): Payer: Self-pay | Admitting: Otolaryngology

## 2024-10-10 NOTE — Telephone Encounter (Signed)
 LVM to r/s appointment on 10/26/24

## 2024-10-12 ENCOUNTER — Encounter (INDEPENDENT_AMBULATORY_CARE_PROVIDER_SITE_OTHER): Payer: Self-pay

## 2024-10-23 ENCOUNTER — Ambulatory Visit (INDEPENDENT_AMBULATORY_CARE_PROVIDER_SITE_OTHER)

## 2024-10-23 VITALS — BP 117/75 | HR 81 | Temp 98.0°F | Wt 149.0 lb

## 2024-10-23 DIAGNOSIS — K219 Gastro-esophageal reflux disease without esophagitis: Secondary | ICD-10-CM

## 2024-10-23 DIAGNOSIS — J383 Other diseases of vocal cords: Secondary | ICD-10-CM | POA: Diagnosis not present

## 2024-10-23 DIAGNOSIS — M542 Cervicalgia: Secondary | ICD-10-CM

## 2024-10-23 DIAGNOSIS — R490 Dysphonia: Secondary | ICD-10-CM

## 2024-10-23 NOTE — Progress Notes (Signed)
 HPI:  Discussed the use of AI scribe software for clinical note transcription with the patient, who gave verbal consent to proceed.  History of Present Illness Laura FIDALGO is a 63 year old female who presents with persistent hoarseness. After her treatment with antibiotics and steroids she states her hoarseness has resolved. She initially suspected GERD but doubts this diagnosis after consuming coffee and spicy foods without issues.   In mid-August, she began experiencing neck tenderness, particularly upon waking. She mentions traveling and sleeping on various pillows without improvement. She is concerned about this persistent tenderness and points to the muscles in the posterior neck.   She has arthritis and recently experienced throbbing pain in her hands and leg, which she describes as 'old people stuff.' She took an eight-hour arthritis pill to alleviate the symptoms. She has not seen a rheumatologist and has not discussed these symptoms with her primary care provider.  She uses Astelin  nasal spray and takes Xyzal  at night for allergies. She also receives allergy shots. She continues to take reflux medication in the morning. The patient denies any episodes of nosebleeds.    PMH/Meds/All/SocHx/FamHx/ROS: Past Medical History:  Diagnosis Date   Arthritis    knees (05/19/2013)   Bronchitis    History of cardiac radiofrequency ablation 04/20/2013   Hx of echocardiogram    Echo (12/15):  EF 60-65%, no RWMA, Gr 1 DD   SVT (supraventricular tachycardia)    WPW (Wolff-Parkinson-White syndrome) ~ 1983   a. s/p ablation 05/20/13.   Past Surgical History:  Procedure Laterality Date   ANAL FISSURE REPAIR  1980's   BREAST BIOPSY Left 2000's   EXCISION MORTON'S NEUROMA Left 05/04/2013   Procedure: LEFT SECOND MT  WEIL/SECOND WEBB SPACE NEUROMA EXCISION ;  Surgeon: Norleen Armor, MD;  Location: [facility] OR;  Service: Orthopedics;  Laterality: Left;   FOOT SURGERY Left 09/2014   hardware removal    SUPRAVENTRICULAR TACHYCARDIA ABLATION  05/19/2013   SUPRAVENTRICULAR TACHYCARDIA ABLATION N/A 05/19/2013   Procedure: SUPRAVENTRICULAR TACHYCARDIA ABLATION;  Surgeon: Danelle LELON [place], MD;  Location: [facility] CATH LAB;  Service: Cardiovascular;  Laterality: N/A;   TOTAL KNEE ARTHROPLASTY Right 03/10/2022   Procedure: Right TOTAL KNEE ARTHROPLASTY;  Surgeon: Vernetta Lonni GRADE, MD;  Location: [facility] OR;  Service: Orthopedics;  Laterality: Right;   No family history of bleeding disorders, wound healing problems or difficulty with anesthesia.  Social Connections: Socially Integrated (11/03/2023)   Social Connection and Isolation Panel    Frequency of Communication with Friends and Family: More than three times a week    Frequency of Social Gatherings with Friends and Family: Once a week    Attends Religious Services: More than 4 times per year    Active Member of [employer] or Organizations: Yes    Attends Engineer, Structural: Not on file    Marital Status: Married    Current Outpatient Medications:    albuterol  (VENTOLIN  HFA) 108 (90 Base) MCG/ACT inhaler, Inhale 2 puffs into the lungs every 4 (four) hours as needed for wheezing or shortness of breath., Disp: , Rfl:    amoxicillin -clavulanate (AUGMENTIN ) 875-125 MG tablet, Take 1 tablet by mouth 2 (two) times daily., Disp: 20 tablet, Rfl: 0   Azelastine -Fluticasone  137-50 MCG/ACT SUSP, Place 1 spray into both nostrils 2 (two) times daily., Disp: , Rfl:    chlorpheniramine (CHLOR-TRIMETON) 4 MG tablet, Take 4 mg by mouth 2 (two) times daily as needed (for seasonal allergies)., Disp: , Rfl:    EPINEPHrine 0.3 mg/0.3  mL IJ SOAJ injection, Inject 0.3 mg into the muscle as needed for anaphylaxis., Disp: , Rfl:    esomeprazole (NEXIUM) 40 MG capsule, Take 40 mg by mouth in the morning and at bedtime., Disp: , Rfl:    famotidine  (PEPCID ) 20 MG tablet, TAKE 1 TABLET BY MOUTH TWICE A DAY, Disp: 180 tablet, Rfl: 1   fluconazole (DIFLUCAN) 150 MG tablet,  Take 1 tablet (150 mg total) by mouth daily., Disp: 14 tablet, Rfl: 0   methylPREDNISolone  (MEDROL  DOSEPAK) 4 MG TBPK tablet, Take with signs of chronic sinusitis and take as directed, Disp: 1 each, Rfl: 1   montelukast  (SINGULAIR ) 10 MG tablet, TAKE 1 TABLET BY MOUTH EVERYDAY AT BEDTIME, Disp: 90 tablet, Rfl: 1   Multiple Vitamin (MULTIVITAMINS PO), Take 1 packet by mouth See admin instructions. Shaklee Vita pack multivitamins- Take 1 packet by mouth once a day, Disp: , Rfl:    nitrofurantoin , macrocrystal-monohydrate, (MACROBID ) 100 MG capsule, Take 100 mg by mouth 2 (two) times daily., Disp: , Rfl:    OVER THE COUNTER MEDICATION, Take 2 tablets by mouth See admin instructions. [employer]- Take 2 tablets by mouth once a day, Disp: , Rfl:    OVER THE COUNTER MEDICATION, Take 1 tablet by mouth See admin instructions. Shaklee supplement for vitamin C- Take 1 tablet by mouth once a day, Disp: , Rfl:    OVER THE COUNTER MEDICATION, Take 5 tablets by mouth See admin instructions. Shaklee Osteomatrix- Take 5 tablets by mouth once a day, Disp: , Rfl:    promethazine -dextromethorphan  (PROMETHAZINE -DM) 6.25-15 MG/5ML syrup, Take 5 mLs by mouth., Disp: , Rfl:    Protein POWD, Take 2 Scoops by mouth See admin instructions. Shaklee life shake + collagen- Mix 2 scoopsful of powder into a desired beverage and drink by mouth once a day, Disp: , Rfl:    TURMERIC PO, Take 1 capsule by mouth See admin instructions. Shaklee- Take 1 capsule by mouth once a day, Disp: , Rfl:    valACYclovir (VALTREX) 500 MG tablet, Take 500 mg by mouth daily as needed (for flares of fever blisters)., Disp: , Rfl:    WIXELA INHUB  250-50 MCG/ACT AEPB, INHALE 1 PUFF INTO THE LUNGS IN THE MORNING AND AT BEDTIME. (Patient taking differently: Inhale 1 puff into the lungs at bedtime.), Disp: 180 each, Rfl: 2   XYZAL  ALLERGY 24HR 5 MG tablet, Take 5 mg by mouth at bedtime., Disp: , Rfl:    levocetirizine (XYZAL ) 5 MG tablet,  Take 5 mg by mouth at bedtime. (Patient not taking: Reported on 10/23/2024), Disp: , Rfl:    pantoprazole  (PROTONIX ) 40 MG tablet, TAKE 1 TABLET BY MOUTH EVERY DAY (Patient not taking: Reported on 10/23/2024), Disp: 90 tablet, Rfl: 1 A complete ROS was performed with pertinent positives/negatives noted in the HPI. The remainder of the ROS are negative.   Physical Exam:  BP 117/75 (BP Location: Left Arm, Patient Position: Sitting, Cuff Size: Normal)   Pulse 81   Temp 98 F (36.7 C)   Wt 149 lb (67.6 kg)   LMP 09/20/2014   SpO2 98%   BMI 24.05 kg/m  General: Well developed, well nourished. No acute distress. Voice normal Head/Face: Normocephalic. No sinus tenderness. Facial nerve intact and equal bilaterally. No facial lacerations. Eyes: PERRL, no scleral icterus or conjunctival hemorrhage. EOMI. Ears: No gross deformity. Normal external canal. Tympanic membrane in tact bilaterally Hearing: Normal speech reception.  Nose: No gross deformity or lesions. No purulent discharge. No  turbinate hypertrophy. Mouth/Oropharynx: Lips without any lesions. Dentition good. No mucosal lesions within the oropharynx. No tonsillar enlargement, exudate, or lesions. Pharyngeal walls symmetrical. Uvula midline. Tongue midline without lesions. Larynx: See TFL if applicable Nasopharynx: See TFL if applicable Neck: Trachea midline. No masses. No thyromegaly or nodules palpated. No crepitus. Lymphatic: No lymphadenopathy in the neck. Respiratory: No stridor or distress. Room air. Cardiovascular: Regular rate and rhythm. Extremities: No edema or cyanosis. Warm and well-perfused. Skin: No scars or lesions on face or neck. Neurologic: CN II-XII grossly intact. Moving all extremities without gross abnormality. Other:  Independent Review of Additional Tests or Records: None Procedures: Flexible Fiberoptic Laryngoscopy Procedure Note  Date of procedure 10/24/2024 Pre-Op Diagnosis: vocal cord lesion    Post Op  Diagnosis: same Procedure: Flexible Fiberoptic Laryngoscopy CPT 68424 - Mod 25 Surgeon: Adah Malkin, D.O. Anesthesia: 4% lidocaine  with afrin Findings:  - bilateral vocal cord motion - no lesions or masses Procedure Detail: After verbal consent was obtained from the patient, the patient was brought in an upright position, a fiberoptic nasal laryngoscope was then passed into the patient right nasal passage and left nasal passage, the right appeared to be more patent. It was passed along the floor of the nasal cavity to the nasopharynx. Torus tubarius was patent and the Fossa of Rosenmller was identified. The scope was then flexed caudally and advanced slowly through the nasopharynx, passed through the oropharynx, and down into the hypopharynx. The patient's oro- and nasopharynx were unremarkable with no signs of any gross lesions, edema, masses, or bleeding.  The base of tongue was visualized and no mass, ulceration or lesion was appreciated. The epiglottis did not demonstrate any mass, ulceration or lesion. The vallecula was also assessed with no mass, ulceration or lesion. The patient had good glottic closure upon phonation and no signs of aspiration or pooling of secretions. The patient was asked to inspire and expire with the true vocal folds vibrating normally and without evidence of vocal fold dysfunction. The true and false vocal cords, interarytenoid, AE folds, and arytenoids did not demonstrate any significant edema or erythema. The patient was then asked to valsalva, and the pyriform sinuses were assessed which were unremarkable. The airway was patent and there was no evidence of compromise. The scope was then slowly withdrawn from the patient. The patient tolerated the procedure well and there were no complications.  Disposition: Stable  Impression & Plans:   Assessment and Plan Assessment & Plan LPR, hoarseness, vocal cord lesion - resolved - Flexible laryngoscopy performed today - no  significant findings - Continue current reflux medication regimen.  Musculoskeletal neck pain Neck pain likely musculoskeletal, related to tension or posture. - Recommend follow-up with primary care provider for comprehensive evaluation of musculoskeletal symptoms. - Consider referral to physical medicine rehabilitation for neck pain management.   Follow-up as needed.  Adah Malkin, DO [facility]

## 2024-10-26 ENCOUNTER — Ambulatory Visit (INDEPENDENT_AMBULATORY_CARE_PROVIDER_SITE_OTHER)

## 2024-10-26 ENCOUNTER — Ambulatory Visit (INDEPENDENT_AMBULATORY_CARE_PROVIDER_SITE_OTHER): Admitting: Otolaryngology

## 2024-12-01 ENCOUNTER — Other Ambulatory Visit: Payer: Self-pay | Admitting: Pulmonary Disease

## 2025-01-01 ENCOUNTER — Other Ambulatory Visit: Payer: Self-pay | Admitting: Obstetrics and Gynecology

## 2025-01-01 DIAGNOSIS — Z803 Family history of malignant neoplasm of breast: Secondary | ICD-10-CM
